# Patient Record
Sex: Female | Born: 1955
Health system: Southern US, Community
[De-identification: ages and names within clinical notes are randomized; demographics above are authoritative.]

## PROBLEM LIST (undated history)

## (undated) DIAGNOSIS — E785 Hyperlipidemia, unspecified: Secondary | ICD-10-CM

## (undated) DIAGNOSIS — E119 Type 2 diabetes mellitus without complications: Secondary | ICD-10-CM

## (undated) DIAGNOSIS — A6920 Lyme disease, unspecified: Secondary | ICD-10-CM

## (undated) DIAGNOSIS — R635 Abnormal weight gain: Secondary | ICD-10-CM

## (undated) DIAGNOSIS — I10 Essential (primary) hypertension: Secondary | ICD-10-CM

## (undated) HISTORY — PX: HYSTEROSCOPY W/ ENDOMETRIAL ABLATION: SUR665

## (undated) HISTORY — DX: Abnormal weight gain: R63.5

## (undated) HISTORY — PX: MYOMECTOMY VAGINAL APPROACH: SUR871

## (undated) HISTORY — DX: Type 2 diabetes mellitus without complications: E11.9

## (undated) HISTORY — DX: Lyme disease, unspecified: A69.20

## (undated) HISTORY — DX: Hyperlipidemia, unspecified: E78.5

## (undated) HISTORY — DX: Essential (primary) hypertension: I10

## (undated) HISTORY — PX: ABDOMINAL HYSTERECTOMY: SHX81

---

## 2000-08-13 ENCOUNTER — Other Ambulatory Visit: Admission: RE | Admit: 2000-08-13 | Discharge: 2000-08-13 | Payer: Self-pay | Admitting: *Deleted

## 2000-08-28 ENCOUNTER — Encounter: Payer: Self-pay | Admitting: *Deleted

## 2000-08-28 ENCOUNTER — Ambulatory Visit (HOSPITAL_COMMUNITY): Admission: RE | Admit: 2000-08-28 | Discharge: 2000-08-28 | Payer: Self-pay | Admitting: *Deleted

## 2004-03-09 ENCOUNTER — Ambulatory Visit: Payer: Self-pay | Admitting: Internal Medicine

## 2004-08-15 ENCOUNTER — Ambulatory Visit (HOSPITAL_BASED_OUTPATIENT_CLINIC_OR_DEPARTMENT_OTHER): Admission: RE | Admit: 2004-08-15 | Discharge: 2004-08-15 | Payer: Self-pay | Admitting: *Deleted

## 2004-08-15 ENCOUNTER — Ambulatory Visit (HOSPITAL_COMMUNITY): Admission: RE | Admit: 2004-08-15 | Discharge: 2004-08-15 | Payer: Self-pay | Admitting: *Deleted

## 2004-08-15 ENCOUNTER — Encounter (INDEPENDENT_AMBULATORY_CARE_PROVIDER_SITE_OTHER): Payer: Self-pay | Admitting: Specialist

## 2004-10-26 ENCOUNTER — Emergency Department (HOSPITAL_COMMUNITY): Admission: EM | Admit: 2004-10-26 | Discharge: 2004-10-26 | Payer: Self-pay | Admitting: Emergency Medicine

## 2004-11-16 ENCOUNTER — Ambulatory Visit: Payer: Self-pay | Admitting: Internal Medicine

## 2004-11-17 ENCOUNTER — Ambulatory Visit (HOSPITAL_COMMUNITY): Admission: RE | Admit: 2004-11-17 | Discharge: 2004-11-17 | Payer: Self-pay | Admitting: Internal Medicine

## 2005-01-19 ENCOUNTER — Ambulatory Visit: Payer: Self-pay | Admitting: Internal Medicine

## 2005-04-11 ENCOUNTER — Ambulatory Visit: Payer: Self-pay | Admitting: Internal Medicine

## 2005-10-10 ENCOUNTER — Ambulatory Visit: Payer: Self-pay | Admitting: Internal Medicine

## 2006-02-14 ENCOUNTER — Ambulatory Visit: Payer: Self-pay | Admitting: Internal Medicine

## 2006-02-14 LAB — CONVERTED CEMR LAB
Chol/HDL Ratio, serum: 3.9
Cholesterol: 198 mg/dL (ref 0–200)
HDL: 51.2 mg/dL (ref >39.0)
LDL Cholesterol: 138 mg/dL — ABNORMAL HIGH (ref 0–99)
Triglyceride fasting, serum: 46 mg/dL (ref 0–149)
VLDL: 9 mg/dL (ref 0–40)

## 2006-05-17 ENCOUNTER — Ambulatory Visit: Payer: Self-pay | Admitting: Internal Medicine

## 2006-07-11 ENCOUNTER — Ambulatory Visit: Payer: Self-pay | Admitting: Internal Medicine

## 2006-10-11 ENCOUNTER — Ambulatory Visit: Payer: Self-pay | Admitting: Internal Medicine

## 2006-10-18 ENCOUNTER — Ambulatory Visit (HOSPITAL_COMMUNITY): Admission: RE | Admit: 2006-10-18 | Discharge: 2006-10-18 | Payer: Self-pay | Admitting: Internal Medicine

## 2006-11-23 DIAGNOSIS — E119 Type 2 diabetes mellitus without complications: Secondary | ICD-10-CM

## 2006-11-23 DIAGNOSIS — I10 Essential (primary) hypertension: Secondary | ICD-10-CM | POA: Insufficient documentation

## 2006-11-23 DIAGNOSIS — E1169 Type 2 diabetes mellitus with other specified complication: Secondary | ICD-10-CM | POA: Insufficient documentation

## 2006-11-23 DIAGNOSIS — E669 Obesity, unspecified: Secondary | ICD-10-CM | POA: Insufficient documentation

## 2006-11-23 HISTORY — DX: Essential (primary) hypertension: I10

## 2006-11-23 HISTORY — DX: Type 2 diabetes mellitus without complications: E11.9

## 2007-01-03 ENCOUNTER — Ambulatory Visit: Payer: Self-pay | Admitting: Internal Medicine

## 2007-01-03 LAB — CONVERTED CEMR LAB
BUN: 13 mg/dL (ref 6–23)
CO2: 32 meq/L (ref 19–32)
Calcium: 9.7 mg/dL (ref 8.4–10.5)
Chloride: 107 meq/L (ref 96–112)
Creatinine, Ser: 0.6 mg/dL (ref 0.4–1.2)
Glucose, Bld: 92 mg/dL (ref 70–99)

## 2007-04-03 ENCOUNTER — Ambulatory Visit: Payer: Self-pay | Admitting: Internal Medicine

## 2007-06-18 ENCOUNTER — Ambulatory Visit: Payer: Self-pay | Admitting: Internal Medicine

## 2007-06-18 LAB — CONVERTED CEMR LAB: Hgb A1c MFr Bld: 6.1 % — ABNORMAL HIGH (ref 4.6–6.0)

## 2007-06-26 ENCOUNTER — Ambulatory Visit: Payer: Self-pay | Admitting: Internal Medicine

## 2007-09-17 ENCOUNTER — Encounter: Payer: Self-pay | Admitting: Internal Medicine

## 2007-10-14 ENCOUNTER — Ambulatory Visit: Payer: Self-pay | Admitting: Internal Medicine

## 2007-10-14 LAB — CONVERTED CEMR LAB
CO2: 31 meq/L (ref 19–32)
Calcium: 9.5 mg/dL (ref 8.4–10.5)
Creatinine,U: 57.1 mg/dL
Glucose, Bld: 96 mg/dL (ref 70–99)
Microalb Creat Ratio: 3.5 mg/g (ref 0.0–30.0)
Microalb, Ur: 0.2 mg/dL (ref 0.0–1.9)
Potassium: 4.3 meq/L (ref 3.5–5.1)
Sodium: 142 meq/L (ref 135–145)

## 2007-10-29 ENCOUNTER — Ambulatory Visit (HOSPITAL_COMMUNITY): Admission: RE | Admit: 2007-10-29 | Discharge: 2007-10-29 | Payer: Self-pay | Admitting: Internal Medicine

## 2008-04-08 ENCOUNTER — Ambulatory Visit: Payer: Self-pay | Admitting: Internal Medicine

## 2008-04-08 DIAGNOSIS — E785 Hyperlipidemia, unspecified: Secondary | ICD-10-CM | POA: Insufficient documentation

## 2008-04-08 HISTORY — DX: Hyperlipidemia, unspecified: E78.5

## 2008-04-08 LAB — CONVERTED CEMR LAB
BUN: 19 mg/dL (ref 6–23)
CO2: 31 meq/L (ref 19–32)
Chloride: 107 meq/L (ref 96–112)
Cholesterol: 196 mg/dL (ref 0–200)
Creatinine, Ser: 0.6 mg/dL (ref 0.4–1.2)
GFR calc non Af Amer: 111 mL/min
Hgb A1c MFr Bld: 6.4 % — ABNORMAL HIGH (ref 4.6–6.0)
LDL Cholesterol: 130 mg/dL — ABNORMAL HIGH (ref 0–99)
Potassium: 4.5 meq/L (ref 3.5–5.1)
Total CHOL/HDL Ratio: 3.8
Triglycerides: 71 mg/dL (ref 0–149)

## 2008-07-13 ENCOUNTER — Ambulatory Visit: Payer: Self-pay | Admitting: Internal Medicine

## 2008-10-12 ENCOUNTER — Ambulatory Visit: Payer: Self-pay | Admitting: Internal Medicine

## 2008-10-12 DIAGNOSIS — R635 Abnormal weight gain: Secondary | ICD-10-CM | POA: Insufficient documentation

## 2008-10-12 HISTORY — DX: Abnormal weight gain: R63.5

## 2008-10-12 LAB — CONVERTED CEMR LAB
BUN: 22 mg/dL (ref 6–23)
CO2: 32 meq/L (ref 19–32)
Calcium: 9.8 mg/dL (ref 8.4–10.5)
Chloride: 102 meq/L (ref 96–112)
Cholesterol: 197 mg/dL (ref 0–200)
Creatinine, Ser: 0.6 mg/dL (ref 0.4–1.2)
GFR calc non Af Amer: 110.65 mL/min (ref >60)
Glucose, Bld: 98 mg/dL (ref 70–99)
HDL: 49.1 mg/dL (ref >39.00)
Hgb A1c MFr Bld: 6.3 % (ref 4.6–6.5)
LDL Cholesterol: 132 mg/dL — ABNORMAL HIGH (ref 0–99)
Potassium: 4.6 meq/L (ref 3.5–5.1)
Sodium: 142 meq/L (ref 135–145)
Total CHOL/HDL Ratio: 4
Triglycerides: 78 mg/dL (ref 0.0–149.0)
VLDL: 15.6 mg/dL (ref 0.0–40.0)

## 2009-01-14 ENCOUNTER — Ambulatory Visit: Payer: Self-pay | Admitting: Internal Medicine

## 2009-03-17 ENCOUNTER — Ambulatory Visit: Payer: Self-pay | Admitting: Family Medicine

## 2009-03-17 DIAGNOSIS — S60429A Blister (nonthermal) of unspecified finger, initial encounter: Secondary | ICD-10-CM | POA: Insufficient documentation

## 2009-04-13 ENCOUNTER — Ambulatory Visit: Payer: Self-pay | Admitting: Internal Medicine

## 2009-04-13 LAB — CONVERTED CEMR LAB
BUN: 16 mg/dL (ref 6–23)
Calcium: 9.5 mg/dL (ref 8.4–10.5)
Creatinine, Ser: 0.6 mg/dL (ref 0.4–1.2)
GFR calc non Af Amer: 110.44 mL/min (ref >60)
Glucose, Bld: 106 mg/dL — ABNORMAL HIGH (ref 70–99)
Hgb A1c MFr Bld: 6.3 % (ref 4.6–6.5)

## 2009-07-13 ENCOUNTER — Ambulatory Visit: Payer: Self-pay | Admitting: Internal Medicine

## 2009-10-12 ENCOUNTER — Ambulatory Visit: Payer: Self-pay | Admitting: Internal Medicine

## 2009-10-12 LAB — CONVERTED CEMR LAB
BUN: 18 mg/dL (ref 6–23)
Calcium: 9.5 mg/dL (ref 8.4–10.5)
Creatinine, Ser: 0.7 mg/dL (ref 0.4–1.2)
GFR calc non Af Amer: 98.76 mL/min (ref >60)
Hgb A1c MFr Bld: 6.4 % (ref 4.6–6.5)

## 2010-01-05 ENCOUNTER — Ambulatory Visit (HOSPITAL_COMMUNITY): Admission: RE | Admit: 2010-01-05 | Discharge: 2010-01-05 | Payer: Self-pay | Admitting: Internal Medicine

## 2010-02-28 ENCOUNTER — Ambulatory Visit: Payer: Self-pay | Admitting: Internal Medicine

## 2010-02-28 LAB — CONVERTED CEMR LAB: Hgb A1c MFr Bld: 6.6 % — ABNORMAL HIGH (ref 4.6–6.5)

## 2010-05-21 ENCOUNTER — Encounter: Payer: Self-pay | Admitting: *Deleted

## 2010-05-26 NOTE — Assessment & Plan Note (Signed)
Summary: 108mo rov/mm   Vital Signs:  Patient profile:   55 year old female Height:      66 inches Weight:      218 pounds BMI:     35.31 Temp:     98.2 degrees F oral Pulse rate:   64 / minute Resp:     14 per minute BP sitting:   130 / 82  (left arm)  Vitals Entered By: Willy Eddy, LPN (July 13, 2009 10:20 AM) CC: roa- dm/htn, Hypertension Management   CC:  roa- dm/htn and Hypertension Management.  History of Present Illness: discussion of dm meds and changing the actos to onglyza monitering of glucose has been good with CBG's in the 100 range  Hypertension History:      She denies headache, chest pain, palpitations, dyspnea with exertion, orthopnea, PND, peripheral edema, visual symptoms, neurologic problems, syncope, and side effects from treatment.        Positive major cardiovascular risk factors include diabetes, hyperlipidemia, hypertension, and family history for ischemic heart disease (females less than 45 years old).  Negative major cardiovascular risk factors include female age less than 41 years old and non-tobacco-user status.        Further assessment for target organ damage reveals no history of ASHD, cardiac end-organ damage (CHF/LVH), stroke/TIA, peripheral vascular disease, renal insufficiency, or hypertensive retinopathy.     Preventive Screening-Counseling & Management  Alcohol-Tobacco     Smoking Status: never  Problems Prior to Update: 1)  Finger Blister Without Mention of Infection  (ICD-915.2) 2)  Weight Gain  (ICD-783.1) 3)  Hyperlipidemia, Borderline  (ICD-272.4) 4)  Physical Examination, Normal  (ICD-V70.0) 5)  Family History Diabetes 1st Degree Relative  (ICD-V18.0) 6)  Hypertension  (ICD-401.9) 7)  Diabetes Mellitus, Type II  (ICD-250.00)  Current Problems (verified): 1)  Finger Blister Without Mention of Infection  (ICD-915.2) 2)  Weight Gain  (ICD-783.1) 3)  Hyperlipidemia, Borderline  (ICD-272.4) 4)  Physical Examination, Normal   (ICD-V70.0) 5)  Family History Diabetes 1st Degree Relative  (ICD-V18.0) 6)  Hypertension  (ICD-401.9) 7)  Diabetes Mellitus, Type II  (ICD-250.00)  Medications Prior to Update: 1)  Ziac 2.5-6.25 Mg  Tabs (Bisoprolol-Hydrochlorothiazide) .... Take 1 Tablet By Mouth Once A Day 2)  Actos 15 Mg Tab (Pioglitazone Hcl) .... Take 1 Tablet By Mouth Once A Day  Current Medications (verified): 1)  Ziac 2.5-6.25 Mg  Tabs (Bisoprolol-Hydrochlorothiazide) .... Take 1 Tablet By Mouth Once A Day 2)  Onglyza 2.5 Mg Tabs (Saxagliptin Hcl)  Allergies (verified): No Known Drug Allergies  Past History:  Family History: Last updated: 01/03/2007 Family History Diabetes 1st degree relative Family History High cholesterol Family History Hypertension  Social History: Last updated: 01/03/2007 Married Never Smoked Drug use-no Regular exercise-yes  Risk Factors: Exercise: yes (01/03/2007)  Risk Factors: Smoking Status: never (07/13/2009)  Past medical, surgical, family and social histories (including risk factors) reviewed, and no changes noted (except as noted below).  Past Medical History: Reviewed history from 11/23/2006 and no changes required. Diabetes mellitus, type II Hypertension  Past Surgical History: Reviewed history from 01/03/2007 and no changes required. GYN surgery  Family History: Reviewed history from 01/03/2007 and no changes required. Family History Diabetes 1st degree relative Family History High cholesterol Family History Hypertension  Social History: Reviewed history from 01/03/2007 and no changes required. Married Never Smoked Drug use-no Regular exercise-yes  Review of Systems  The patient denies anorexia, fever, weight loss, weight gain, vision loss, decreased hearing, hoarseness,  chest pain, syncope, dyspnea on exertion, peripheral edema, prolonged cough, headaches, hemoptysis, abdominal pain, melena, hematochezia, severe indigestion/heartburn, hematuria,  incontinence, genital sores, muscle weakness, suspicious skin lesions, transient blindness, difficulty walking, depression, unusual weight change, abnormal bleeding, enlarged lymph nodes, angioedema, and breast masses.    Physical Exam  General:  Well-developed,well-nourished,in no acute distress; alert,appropriate and cooperative throughout examination Head:  Normocephalic and atraumatic without obvious abnormalities. No apparent alopecia or balding. Eyes:  pupils equal and pupils round.   Ears:  R ear normal and L ear normal.   Neck:  supple.   Lungs:  normal respiratory effort and no intercostal retractions.   Heart:  normal rate, regular rhythm, and no murmur.   Abdomen:  Bowel sounds positive,abdomen soft and non-tender without masses, organomegaly or hernias noted. Msk:  No deformity or scoliosis noted of thoracic or lumbar spine.   Pulses:  R and L carotid,radial,femoral,dorsalis pedis and posterior tibial pulses are full and equal bilaterally   Impression & Recommendations:  Problem # 1:  HYPERTENSION (ICD-401.9)  Her updated medication list for this problem includes:    Ziac 2.5-6.25 Mg Tabs (Bisoprolol-hydrochlorothiazide) .Marland Kitchen... Take 1 tablet by mouth once a day  BP today: 130/82 Prior BP: 110/80 (04/13/2009)  Prior 10 Yr Risk Heart Disease: 15 % (04/13/2009)  Labs Reviewed: K+: 4.1 (04/13/2009) Creat: : 0.6 (04/13/2009)   Chol: 197 (10/12/2008)   HDL: 49.10 (10/12/2008)   LDL: 132 (10/12/2008)   TG: 78.0 (10/12/2008)  Problem # 2:  DIABETES MELLITUS, TYPE II (ICD-250.00)  The following medications were removed from the medication list:    Actos 15 Mg Tab (Pioglitazone hcl) .Marland Kitchen... Take 1 tablet by mouth once a day Her updated medication list for this problem includes:    Onglyza 2.5 Mg Tabs (Saxagliptin hcl)  Labs Reviewed: Creat: 0.6 (04/13/2009)    Reviewed HgBA1c results: 6.3 (04/13/2009)  6.3 (10/12/2008)  Complete Medication List: 1)  Ziac 2.5-6.25 Mg Tabs  (Bisoprolol-hydrochlorothiazide) .... Take 1 tablet by mouth once a day 2)  Onglyza 2.5 Mg Tabs (Saxagliptin hcl)  Hypertension Assessment/Plan:      The patient's hypertensive risk group is category C: Target organ damage and/or diabetes.  Her calculated 10 year risk of coronary heart disease is 15 %.  Today's blood pressure is 130/82.  Her blood pressure goal is < 130/80.  Patient Instructions: 1)  Please schedule a follow-up appointment in 3 months.

## 2010-05-26 NOTE — Assessment & Plan Note (Signed)
Summary: 4 month rov/njr/PT RESCD FROM [facility]//CCM/pt rescd from [facility]//c...   Vital Signs:  Patient profile:   55 year old female Height:      66 inches Weight:      216 pounds BMI:     34.99 Temp:     98.6 degrees F oral Pulse rate:   72 / minute Resp:     14 per minute BP sitting:   136 / 80  (left arm)  Vitals Entered By: Willy Eddy, LPN (February 28, 2010 10:02 AM) CC: roa, Hypertension Management Is Patient Diabetic? Yes Did you bring your meter with you today? No   Primary Care Provider:  Stacie Glaze MD  CC:  roa and Hypertension Management.  History of Present Illness: the pt does not test her glucose  Hypertension History:      She denies headache, chest pain, palpitations, dyspnea with exertion, orthopnea, PND, peripheral edema, visual symptoms, neurologic problems, syncope, and side effects from treatment.        Positive major cardiovascular risk factors include female age 46 years old or older, diabetes, hyperlipidemia, hypertension, and family history for ischemic heart disease (females less than 32 years old).  Negative major cardiovascular risk factors include non-tobacco-user status.        Further assessment for target organ damage reveals no history of ASHD, cardiac end-organ damage (CHF/LVH), stroke/TIA, peripheral vascular disease, renal insufficiency, or hypertensive retinopathy.     Preventive Screening-Counseling & Management  Alcohol-Tobacco     Smoking Status: never     Tobacco Counseling: not indicated; no tobacco use  Problems Prior to Update: 1)  Finger Blister Without Mention of Infection  (ICD-915.2) 2)  Weight Gain  (ICD-783.1) 3)  Hyperlipidemia, Borderline  (ICD-272.4) 4)  Physical Examination, Normal  (ICD-V70.0) 5)  Family History Diabetes 1st Degree Relative  (ICD-V18.0) 6)  Hypertension  (ICD-401.9) 7)  Diabetes Mellitus, Type II  (ICD-250.00)  Current Problems (verified): 1)  Finger Blister Without Mention of Infection   (ICD-915.2) 2)  Weight Gain  (ICD-783.1) 3)  Hyperlipidemia, Borderline  (ICD-272.4) 4)  Physical Examination, Normal  (ICD-V70.0) 5)  Family History Diabetes 1st Degree Relative  (ICD-V18.0) 6)  Hypertension  (ICD-401.9) 7)  Diabetes Mellitus, Type II  (ICD-250.00)  Medications Prior to Update: 1)  Ziac 2.5-6.25 Mg  Tabs (Bisoprolol-Hydrochlorothiazide) .... Take 1 Tablet By Mouth Once A Day 2)  Onglyza 2.5 Mg Tabs (Saxagliptin Hcl) .Marland Kitchen.. 1 Once Daily  Current Medications (verified): 1)  Ziac 2.5-6.25 Mg  Tabs (Bisoprolol-Hydrochlorothiazide) .... Take 1 Tablet By Mouth Once A Day 2)  Onglyza 2.5 Mg Tabs (Saxagliptin Hcl) .Marland Kitchen.. 1 Once Daily  Allergies (verified): No Known Drug Allergies  Past History:  Family History: Last updated: 01/03/2007 Family History Diabetes 1st degree relative Family History High cholesterol Family History Hypertension  Social History: Last updated: 01/03/2007 Married Never Smoked Drug use-no Regular exercise-yes  Risk Factors: Exercise: yes (01/03/2007)  Risk Factors: Smoking Status: never (02/28/2010)  Past medical, surgical, family and social histories (including risk factors) reviewed, and no changes noted (except as noted below).  Past Medical History: Reviewed history from 11/23/2006 and no changes required. Diabetes mellitus, type II Hypertension  Past Surgical History: Reviewed history from 01/03/2007 and no changes required. GYN surgery  Family History: Reviewed history from 01/03/2007 and no changes required. Family History Diabetes 1st degree relative Family History High cholesterol Family History Hypertension  Social History: Reviewed history from 01/03/2007 and no changes required. Married Never Smoked Drug  use-no Regular exercise-yes  Review of Systems  The patient denies anorexia, fever, weight loss, weight gain, vision loss, decreased hearing, hoarseness, chest pain, syncope, dyspnea on exertion, peripheral  edema, prolonged cough, headaches, hemoptysis, abdominal pain, melena, hematochezia, severe indigestion/heartburn, hematuria, incontinence, genital sores, muscle weakness, suspicious skin lesions, transient blindness, difficulty walking, depression, unusual weight change, abnormal bleeding, enlarged lymph nodes, angioedema, and breast masses.         Flu Vaccine Consent Questions     Do you have a history of severe allergic reactions to this vaccine? no    Any prior history of allergic reactions to egg and/or gelatin? no    Do you have a sensitivity to the preservative Thimersol? no    Do you have a past history of Guillan-Barre Syndrome? no    Do you currently have an acute febrile illness? no    Have you ever had a severe reaction to latex? no    Vaccine information given and explained to patient? yes    Are you currently pregnant? no    Lot Number:[id number]   Exp Date:10/22/2010   Site Given  Left Deltoid IM   Physical Exam  General:  Well-developed,well-nourished,in no acute distress; alert,appropriate and cooperative throughout examination Eyes:  pupils equal and pupils round.   Ears:  R ear normal and L ear normal.   Mouth:  no dental plaque and pharynx pink and moist.   Lungs:  normal respiratory effort and no intercostal retractions.   Heart:  normal rate, regular rhythm, and no murmur.   Abdomen:  Bowel sounds positive,abdomen soft and non-tender without masses, organomegaly or hernias noted. Msk:  No deformity or scoliosis noted of thoracic or lumbar spine.    Diabetes Management Exam:    Foot Exam (with socks and/or shoes not present):       Sensory-Pinprick/Light touch:          Left medial foot (L-4): normal          Left dorsal foot (L-5): normal          Left lateral foot (S-1): normal          Right medial foot (L-4): normal          Right dorsal foot (L-5): normal          Right lateral foot (S-1): normal       Sensory-Monofilament:          Left foot: normal           Right foot: normal       Inspection:          Left foot: normal          Right foot: normal       Nails:          Left foot: normal          Right foot: normal   Impression & Recommendations:  Problem # 1:  HYPERTENSION (ICD-401.9)  Her updated medication list for this problem includes:    Ziac 2.5-6.25 Mg Tabs (Bisoprolol-hydrochlorothiazide) .Marland Kitchen... Take 1 tablet by mouth once a day  BP today: 136/80 Prior BP: 120/78 (10/12/2009)  Prior 10 Yr Risk Heart Disease: 17 % (10/12/2009)  Labs Reviewed: K+: 4.4 (10/12/2009) Creat: : 0.7 (10/12/2009)   Chol: 212 (10/12/2009)   HDL: 49.30 (10/12/2009)   LDL: 132 (10/12/2008)   TG: 78.0 (10/12/2008)  Problem # 2:  DIABETES MELLITUS, TYPE II (ICD-250.00)  Her updated medication list for this problem  includes:    Onglyza 2.5 Mg Tabs (Saxagliptin hcl) .Marland Kitchen... 1 once daily  Labs Reviewed: Creat: 0.7 (10/12/2009)    Reviewed HgBA1c results: 6.4 (10/12/2009)  6.3 (04/13/2009)  Orders: Venipuncture (56213) TLB-A1C / Hgb A1C (Glycohemoglobin) (83036-A1C)  Complete Medication List: 1)  Ziac 2.5-6.25 Mg Tabs (Bisoprolol-hydrochlorothiazide) .... Take 1 tablet by mouth once a day 2)  Onglyza 2.5 Mg Tabs (Saxagliptin hcl) .Marland Kitchen.. 1 once daily  Other Orders: Admin 1st Vaccine (08657) Flu Vaccine 26yrs + (84696)  Hypertension Assessment/Plan:      The patient's hypertensive risk group is category C: Target organ damage and/or diabetes.  Her calculated 10 year risk of coronary heart disease is 17 %.  Today's blood pressure is 136/80.  Her blood pressure goal is < 130/80.  Patient Instructions: 1)  weight loss needed 2)  It is important that you exercise regularly at least 20 minutes 5 times a week. If you develop chest pain, have severe difficulty breathing, or feel very tired , stop exercising immediately and seek medical attention.   Orders Added: 1)  Admin 1st Vaccine [90471] 2)  Flu Vaccine 69yrs + [90658] 3)  Est. Patient Level IV  [29528] 4)  Venipuncture [36415] 5)  TLB-A1C / Hgb A1C (Glycohemoglobin) [83036-A1C]  Appended Document: Orders Update    Clinical Lists Changes  Orders: Added new Service order of Specimen Handling (41324) - Signed

## 2010-05-26 NOTE — Assessment & Plan Note (Signed)
Summary: 34 month rov/njr   Vital Signs:  Patient profile:   55 year old female Height:      66 inches Weight:      216 pounds BMI:     34.99 Temp:     98.2 degrees F oral Pulse rate:   68 / minute Resp:     14 per minute BP sitting:   120 / 78  (left arm)  Vitals Entered By: Willy Eddy, LPN (October 12, 2009 9:22 AM) CC: roa, Hypertension Management   CC:  roa and Hypertension Management.  History of Present Illness: the pt needs to check sugars  but has been compliant with the medication weight down 3 pounds  Diabetes Management History:      The patient is a 55 years old female who comes in for evaluation of DM Type 2.  She has not been enrolled in the "Diabetic Education Program".  She states understanding of dietary principles and is following her diet appropriately.  Sensory loss is noted.  Self foot exams are being performed.  She is not checking home blood sugars.  She says that she is exercising.  Type of exercise includes: walking.  Duration of exercise is estimated to be 30 min.  She is doing this 3 times per week.        Hypoglycemic symptoms are not occurring.  No hyperglycemic symptoms are reported.        There are no symptoms to suggest diabetic complications.  No changes have been made to her treatment plan since last visit.    Hypertension History:      She denies headache, chest pain, palpitations, dyspnea with exertion, orthopnea, PND, peripheral edema, visual symptoms, neurologic problems, syncope, and side effects from treatment.        Positive major cardiovascular risk factors include female age 70 years old or older, diabetes, hyperlipidemia, hypertension, and family history for ischemic heart disease (females less than 59 years old).  Negative major cardiovascular risk factors include non-tobacco-user status.        Further assessment for target organ damage reveals no history of ASHD, cardiac end-organ damage (CHF/LVH), stroke/TIA, peripheral vascular  disease, renal insufficiency, or hypertensive retinopathy.     Preventive Screening-Counseling & Management  Alcohol-Tobacco     Smoking Status: never  Problems Prior to Update: 1)  Finger Blister Without Mention of Infection  (ICD-915.2) 2)  Weight Gain  (ICD-783.1) 3)  Hyperlipidemia, Borderline  (ICD-272.4) 4)  Physical Examination, Normal  (ICD-V70.0) 5)  Family History Diabetes 1st Degree Relative  (ICD-V18.0) 6)  Hypertension  (ICD-401.9) 7)  Diabetes Mellitus, Type II  (ICD-250.00)  Current Problems (verified): 1)  Finger Blister Without Mention of Infection  (ICD-915.2) 2)  Weight Gain  (ICD-783.1) 3)  Hyperlipidemia, Borderline  (ICD-272.4) 4)  Physical Examination, Normal  (ICD-V70.0) 5)  Family History Diabetes 1st Degree Relative  (ICD-V18.0) 6)  Hypertension  (ICD-401.9) 7)  Diabetes Mellitus, Type II  (ICD-250.00)  Medications Prior to Update: 1)  Ziac 2.5-6.25 Mg  Tabs (Bisoprolol-Hydrochlorothiazide) .... Take 1 Tablet By Mouth Once A Day 2)  Onglyza 2.5 Mg Tabs (Saxagliptin Hcl)  Current Medications (verified): 1)  Ziac 2.5-6.25 Mg  Tabs (Bisoprolol-Hydrochlorothiazide) .... Take 1 Tablet By Mouth Once A Day 2)  Onglyza 2.5 Mg Tabs (Saxagliptin Hcl) .Marland Kitchen.. 1 Once Daily  Allergies (verified): No Known Drug Allergies  Past History:  Family History: Last updated: 01/03/2007 Family History Diabetes 1st degree relative Family History High cholesterol Family History  Hypertension  Social History: Last updated: 01/03/2007 Married Never Smoked Drug use-no Regular exercise-yes  Risk Factors: Exercise: yes (01/03/2007)  Risk Factors: Smoking Status: never (10/12/2009)  Past medical, surgical, family and social histories (including risk factors) reviewed, and no changes noted (except as noted below).  Past Medical History: Reviewed history from 11/23/2006 and no changes required. Diabetes mellitus, type II Hypertension  Past Surgical  History: Reviewed history from 01/03/2007 and no changes required. GYN surgery  Family History: Reviewed history from 01/03/2007 and no changes required. Family History Diabetes 1st degree relative Family History High cholesterol Family History Hypertension  Social History: Reviewed history from 01/03/2007 and no changes required. Married Never Smoked Drug use-no Regular exercise-yes  Review of Systems  The patient denies anorexia, fever, weight loss, weight gain, vision loss, decreased hearing, hoarseness, chest pain, syncope, dyspnea on exertion, peripheral edema, prolonged cough, headaches, hemoptysis, abdominal pain, melena, hematochezia, severe indigestion/heartburn, hematuria, incontinence, genital sores, muscle weakness, suspicious skin lesions, transient blindness, difficulty walking, depression, unusual weight change, abnormal bleeding, enlarged lymph nodes, angioedema, and breast masses.    Physical Exam  General:  Well-developed,well-nourished,in no acute distress; alert,appropriate and cooperative throughout examination Head:  Normocephalic and atraumatic without obvious abnormalities. No apparent alopecia or balding. Eyes:  pupils equal and pupils round.   Ears:  R ear normal and L ear normal.   Mouth:  no dental plaque and pharynx pink and moist.   Neck:  supple.   Lungs:  normal respiratory effort and no intercostal retractions.   Heart:  normal rate, regular rhythm, and no murmur.   Abdomen:  Bowel sounds positive,abdomen soft and non-tender without masses, organomegaly or hernias noted. Msk:  No deformity or scoliosis noted of thoracic or lumbar spine.   Extremities:  hand is improved from injury Neurologic:  No cranial nerve deficits noted. Station and gait are normal. Plantar reflexes are down-going bilaterally. DTRs are symmetrical throughout. Sensory, motor and coordinative functions appear intact.   Impression & Recommendations:  Problem # 1:  HYPERTENSION  (ICD-401.9)  Her updated medication list for this problem includes:    Ziac 2.5-6.25 Mg Tabs (Bisoprolol-hydrochlorothiazide) .Marland Kitchen... Take 1 tablet by mouth once a day  BP today: 120/78 Prior BP: 130/82 (07/13/2009)  10 Yr Risk Heart Disease: 17 % Prior 10 Yr Risk Heart Disease: 15 % (04/13/2009)  Labs Reviewed: K+: 4.1 (04/13/2009) Creat: : 0.6 (04/13/2009)   Chol: 197 (10/12/2008)   HDL: 49.10 (10/12/2008)   LDL: 132 (10/12/2008)   TG: 78.0 (10/12/2008)  Problem # 2:  DIABETES MELLITUS, TYPE II (ICD-250.00)  Her updated medication list for this problem includes:    Onglyza 2.5 Mg Tabs (Saxagliptin hcl) .Marland Kitchen... 1 once daily  Labs Reviewed: Creat: 0.6 (04/13/2009)    Reviewed HgBA1c results: 6.3 (04/13/2009)  6.3 (10/12/2008)  Orders: Venipuncture (64403) TLB-BMP (Basic Metabolic Panel-BMET) (80048-METABOL) TLB-A1C / Hgb A1C (Glycohemoglobin) (83036-A1C)  Complete Medication List: 1)  Ziac 2.5-6.25 Mg Tabs (Bisoprolol-hydrochlorothiazide) .... Take 1 tablet by mouth once a day 2)  Onglyza 2.5 Mg Tabs (Saxagliptin hcl) .Marland Kitchen.. 1 once daily  Other Orders: TLB-Cholesterol, Total (82465-CHO) TLB-Cholesterol, Direct LDL (83721-DIRLDL) TLB-Cholesterol, HDL (83718-HDL)  Diabetes Management Assessment/Plan:      Her blood pressure goal is < 130/80.    Hypertension Assessment/Plan:      The patient's hypertensive risk group is category C: Target organ damage and/or diabetes.  Her calculated 10 year risk of coronary heart disease is 17 %.  Today's blood pressure is 120/78.  Her blood  pressure goal is < 130/80.  Patient Instructions: 1)  Please schedule a follow-up appointment in 4 months.  ( MAY MAKE ONE FOR MOTHER AT SOME TIME Prescriptions: ONGLYZA 2.5 MG TABS (SAXAGLIPTIN HCL) 1 once daily  #30 x 6   Entered by:   Willy Eddy, LPN   Authorized by:   Stacie Glaze MD   Signed by:   Willy Eddy, LPN on 81/19/1478   Method used:   Electronically to        [facility]  [phone]* (retail)       [address]       [place], [place]  [address]       Ph: [phone] or [phone]       Fax: [phone]   RxID:   [id number]

## 2010-06-02 ENCOUNTER — Ambulatory Visit (INDEPENDENT_AMBULATORY_CARE_PROVIDER_SITE_OTHER): Payer: Managed Care, Other (non HMO) | Admitting: Internal Medicine

## 2010-06-02 ENCOUNTER — Encounter: Payer: Self-pay | Admitting: Internal Medicine

## 2010-06-02 DIAGNOSIS — E785 Hyperlipidemia, unspecified: Secondary | ICD-10-CM

## 2010-06-02 DIAGNOSIS — E119 Type 2 diabetes mellitus without complications: Secondary | ICD-10-CM

## 2010-06-02 DIAGNOSIS — I10 Essential (primary) hypertension: Secondary | ICD-10-CM

## 2010-06-02 MED ORDER — BISOPROLOL-HYDROCHLOROTHIAZIDE 2.5-6.25 MG PO TABS: 1.0000 | ORAL_TABLET | Freq: Every day | ORAL | Status: DC

## 2010-06-02 MED ORDER — OMEGA-3 KRILL OIL 300 MG PO CAPS: 1.0000 | ORAL_CAPSULE | Freq: Two times a day (BID) | ORAL | Status: DC

## 2010-06-02 NOTE — Assessment & Plan Note (Signed)
The patient admits that fast food consumption is a large part of the increase in her total cholesterol and LDL cholesterol increasing her risk factors for cardiovascular disease we discussed elimination of such fast foods as french fries combined with a weight loss plan and we would recommend to add omega-3's such as visual capsules or kril oil to herr diet and supplement plan

## 2010-06-02 NOTE — Progress Notes (Signed)
  Subjective:    Patient ID: Amanda Eaton, female    DOB: 02-15-1956, 55 y.o.   MRN: [id number]  HPI  patient presents for followup of her diabetes and hypertension as well as management of obesity she has admitted to dietary noncompliance and has been eating more fast food lately weight gain is noted and documented in chart.   Her blood pressure is moderately well controlled but above our goal of 130/80 she is compliant with her medications and reports no side effects.  She is not monitoring her diabetes and has no fingerstick records to review but her dietary noncompliance suggested her A1c would be elevated her last A1c was 6.6   Review of Systems  Constitutional: Negative for activity change, appetite change and fatigue.  HENT: Negative for ear pain, congestion, neck pain, postnasal drip and sinus pressure.   Eyes: Negative for redness and visual disturbance.  Respiratory: Negative for cough, shortness of breath and wheezing.   Gastrointestinal: Negative for abdominal pain and abdominal distention.  Genitourinary: Negative for dysuria, frequency and menstrual problem.  Musculoskeletal: Negative for myalgias, joint swelling and arthralgias.  Skin: Negative for rash and wound.  Neurological: Negative for dizziness, weakness and headaches.  Hematological: Negative for adenopathy. Does not bruise/bleed easily.  Psychiatric/Behavioral: Negative for sleep disturbance and decreased concentration.       Objective:   Physical Exam  Constitutional: She is oriented to person, place, and time. She appears well-developed and well-nourished. No distress.  HENT:  Head: Normocephalic and atraumatic.  Right Ear: External ear normal.  Left Ear: External ear normal.  Nose: Nose normal.  Mouth/Throat: Oropharynx is clear and moist.  Eyes: Conjunctivae and EOM are normal. Pupils are equal, round, and reactive to light.  Neck: Normal range of motion. Neck supple. No JVD present. No tracheal  deviation present. No thyromegaly present.  Cardiovascular: Normal rate, regular rhythm, normal heart sounds and intact distal pulses.   No murmur heard. Pulmonary/Chest: Effort normal and breath sounds normal. She has no wheezes. She exhibits no tenderness.  Abdominal: Soft. Bowel sounds are normal.  Musculoskeletal: Normal range of motion. She exhibits no edema and no tenderness.  Lymphadenopathy:    She has no cervical adenopathy.  Neurological: She is alert and oriented to person, place, and time. She has normal reflexes. No cranial nerve deficit.  Skin: Skin is warm and dry. She is not diaphoretic.  Psychiatric: She has a normal mood and affect. Her behavior is normal.   Diabetic foot exam:  Left: Reflexes 2+   Vibratory sensation normal  Proprioception normal  Sharp/dull discrimination normal  Filament test present Right: Reflexes 4+   Vibratory sensation normal  Proprioception normal  Sharp/dull discrimination normal  Filament test present       Assessment & Plan:

## 2010-06-02 NOTE — Assessment & Plan Note (Signed)
Again weight loss is key part of our there appear to intervention for her blood pressure days blood pressure was was over the goal for our patient of 130/80

## 2010-06-02 NOTE — Assessment & Plan Note (Signed)
The patient is a 55 year old white female who presents for followup of her diabetes. She has not been monitoring her fingerstick blood glucoses and her last A1c was 6.6 this is an increased value for her before this her baseline A1c's were hovering in the 6.3 range the elevation of her A1c's is due in part to dietary indiscretion as well as loss of weight control. Our plan for her is to resume monitoring her blood glucoses adopt a program of exercise on a regular basis and says her weight loss goals and 10 pounds between now and her return office visit in 3 months.

## 2010-09-09 NOTE — Op Note (Signed)
NAME:  Amanda Eaton, Amanda Eaton               ACCOUNT NO.:  [id number]   MEDICAL RECORD NO.:  [id number]          PATIENT TYPE:  AMB   LOCATION:  [facility]                         FACILITY:  [facility]   PHYSICIAN:  Pershing Cox, M.D.DATE OF BIRTH:  1955/05/05   DATE OF PROCEDURE:  08/15/2004  DATE OF DISCHARGE:                                 OPERATIVE REPORT   PREOPERATIVE DIAGNOSIS:  Menorrhagia and endometrial polyp.   POSTOPERATIVE DIAGNOSIS:  Menorrhagia and endometrial polyp, uterine myomas.   OPERATION PERFORMED:  Examination under anesthesia, fractional D&C,  hysteroscopy with resection of endometrial polyp.   SURGEON:  Pershing Cox, M.D.   ASSISTANT:  None.   ANESTHESIA:   INDICATIONS FOR PROCEDURE:  The patient has been followed in our office for  gyn care for some time.  In August of last year, her uterus was found to  have increased significantly in size and a sonogram was performed.  On the  sonogram she had numbers of myomas and a thickened endometrial cavity.  She  returned in August for a hydrosonogram and this hydrosonogram showed a mass  in the endometrial canal which looked like an endometrial polyp.  She does  have menorrhagia with four days of clots associated with each of her  menstrual periods and occasionally changes as often as every three hours.  In preparation for this procedure, she received a dose of Aygestin to bring  on a scheduled menstrual period and she was bleeding at the time of this  procedure.   OPERATIVE FINDINGS:  Examination under anesthesia showed the uterus to be 14  weeks in size and irregular in shape.  The endometrial canal sounds to a  depth of 13 cm.  The cavity was distended and except for the broad based  polyp noted on the posterior uterine wall in the lower uterine segment,  there were no abnormalities found.  The specimens of resections and  curettings were combined at the end of the procedure.   DESCRIPTION OF PROCEDURE:  Amanda Eaton was brought to the operating room  with an IV in place.  She received a gram of Ancef in the holding area and  supine on the operating room table, IV sedation was administered.  She was  then placed into Allen stirrups and using Betadine, her perineum, vagina and  upper thighs and lower abdomen were prepped for the surgical procedure, then  draped sterilely. A collecting drape was placed beneath her hips so that the  effluent could be measured at all times throughout the procedure.   A weighted speculum was introduced into the vagina.  The cervix was at the  introitus and was grasped with a single toothed tenaculum.  0.25% Marcaine  was instilled into the paracervical areas at 3, 4, 7 and 8 sites with an  instillation of total 10 cc.  Endocervical curettings were collected onto a  Telfa.  Uterine sound then passed to a depth of 13 cm.  0 Pratt dilators  were used to dilate the cervix to a size 13.  The resectoscope was  introduced and using through-and-through  irrigation, the cavity was  visualized and photographed. A single loop wire was used to resected the  noted endometrial polyp and the fragments were collected for specimen.  A  #12 sharp curette was used to curette the endometrial lining onto a Telfa  and the Meigs curette was then used to serially curette the uterine walls as  well.  All  of these specimens were sent as one to pathology as endometrial curettings  and resection.  The sound passed again to 13 cm.  The procedure was  terminated.  The patient was then transferred to the recovery room in good  condition.  The effluent measured during the procedure was only 25 cc.      MAJ/MEDQ  D:  08/15/2004  T:  08/15/2004  Job:  [id number]

## 2010-10-04 ENCOUNTER — Other Ambulatory Visit (INDEPENDENT_AMBULATORY_CARE_PROVIDER_SITE_OTHER): Payer: Managed Care, Other (non HMO)

## 2010-10-04 ENCOUNTER — Other Ambulatory Visit: Payer: Self-pay | Admitting: Internal Medicine

## 2010-10-04 DIAGNOSIS — I10 Essential (primary) hypertension: Secondary | ICD-10-CM

## 2010-10-04 DIAGNOSIS — E119 Type 2 diabetes mellitus without complications: Secondary | ICD-10-CM

## 2010-10-04 DIAGNOSIS — E785 Hyperlipidemia, unspecified: Secondary | ICD-10-CM

## 2010-10-04 LAB — BASIC METABOLIC PANEL
Chloride: 106 mEq/L (ref 96–112)
GFR: 89 mL/min (ref >60.00)
Glucose, Bld: 114 mg/dL — ABNORMAL HIGH (ref 70–99)
Potassium: 5.5 mEq/L — ABNORMAL HIGH (ref 3.5–5.1)
Sodium: 140 mEq/L (ref 135–145)

## 2010-10-04 LAB — LIPID PANEL
HDL: 55.5 mg/dL (ref >39.00)
Triglycerides: 55 mg/dL (ref 0.0–149.0)
VLDL: 11 mg/dL (ref 0.0–40.0)

## 2010-10-04 LAB — LDL CHOLESTEROL, DIRECT: Direct LDL: 158.8 mg/dL

## 2010-10-10 ENCOUNTER — Encounter: Payer: Self-pay | Admitting: Internal Medicine

## 2010-10-10 ENCOUNTER — Ambulatory Visit (INDEPENDENT_AMBULATORY_CARE_PROVIDER_SITE_OTHER): Payer: Managed Care, Other (non HMO) | Admitting: Internal Medicine

## 2010-10-10 VITALS — BP 130/80 | HR 72 | Temp 98.1°F | Resp 16 | Ht 66.0 in | Wt 208.0 lb

## 2010-10-10 DIAGNOSIS — R635 Abnormal weight gain: Secondary | ICD-10-CM

## 2010-10-10 DIAGNOSIS — E669 Obesity, unspecified: Secondary | ICD-10-CM

## 2010-10-10 DIAGNOSIS — E785 Hyperlipidemia, unspecified: Secondary | ICD-10-CM

## 2010-10-10 DIAGNOSIS — E1169 Type 2 diabetes mellitus with other specified complication: Secondary | ICD-10-CM

## 2010-10-10 DIAGNOSIS — E119 Type 2 diabetes mellitus without complications: Secondary | ICD-10-CM

## 2010-10-10 DIAGNOSIS — I10 Essential (primary) hypertension: Secondary | ICD-10-CM

## 2010-10-10 MED ORDER — SAXAGLIPTIN HCL 2.5 MG PO TABS: 6.2500 mg | ORAL_TABLET | Freq: Every day | ORAL | Status: DC

## 2010-10-10 MED ORDER — SAXAGLIPTIN-METFORMIN ER 2.5-1000 MG PO TB24: 1.0000 | ORAL_TABLET | Freq: Every day | ORAL | Status: DC

## 2010-10-10 NOTE — Progress Notes (Signed)
  Subjective:    Patient ID: Amanda Eaton, female    DOB: 1955/10/18, 55 y.o.   MRN: [id number]  HPI the patient has adult-onset diabetes and hypertension and hyperlipidemia.  Screening shows that her A1c despite weight loss is still elevated at 6.6 and her cholesterol has worsened over the past year.  She has an elevated total cholesterol triglycerides blood sugar and LDL are patent cholesterol.      Review of Systems  Constitutional: Negative for activity change, appetite change and fatigue.  HENT: Negative for ear pain, congestion, neck pain, postnasal drip and sinus pressure.   Eyes: Negative for redness and visual disturbance.  Respiratory: Negative for cough, shortness of breath and wheezing.   Gastrointestinal: Negative for abdominal pain and abdominal distention.  Genitourinary: Negative for dysuria, frequency and menstrual problem.  Musculoskeletal: Negative for myalgias, joint swelling and arthralgias.  Skin: Negative for rash and wound.  Neurological: Negative for dizziness, weakness and headaches.  Hematological: Negative for adenopathy. Does not bruise/bleed easily.  Psychiatric/Behavioral: Negative for sleep disturbance and decreased concentration.   Past Medical History  Diagnosis Date  . DIABETES MELLITUS, TYPE II 11/23/2006  . HYPERLIPIDEMIA, BORDERLINE 04/08/2008  . HYPERTENSION 11/23/2006  . WEIGHT GAIN 10/12/2008   Past Surgical History  Procedure Date  . Hysteroscopy w/ endometrial ablation     1987    reports that she has never smoked. She does not have any smokeless tobacco history on file. She reports that she drinks alcohol. She reports that she does not use illicit drugs. family history includes COPD in her father; Diabetes in her mother; Hyperlipidemia in her mother; and Hypertension in her mother. No Known Allergies     Objective:   Physical Exam  Nursing note and vitals reviewed. Constitutional: She is oriented to person, place, and time. She  appears well-developed and well-nourished. No distress.  HENT:  Head: Normocephalic and atraumatic.  Right Ear: External ear normal.  Left Ear: External ear normal.  Nose: Nose normal.  Mouth/Throat: Oropharynx is clear and moist.  Eyes: Conjunctivae and EOM are normal. Pupils are equal, round, and reactive to light.  Neck: Normal range of motion. Neck supple. No JVD present. No tracheal deviation present. No thyromegaly present.  Cardiovascular: Normal rate, regular rhythm, normal heart sounds and intact distal pulses.   No murmur heard. Pulmonary/Chest: Effort normal and breath sounds normal. She has no wheezes. She exhibits no tenderness.  Abdominal: Soft. Bowel sounds are normal.  Musculoskeletal: Normal range of motion. She exhibits no edema and no tenderness.  Lymphadenopathy:    She has no cervical adenopathy.  Neurological: She is alert and oriented to person, place, and time. She has normal reflexes. No cranial nerve deficit.  Skin: Skin is warm and dry. She is not diaphoretic.  Psychiatric: She has a normal mood and affect. Her behavior is normal.          Assessment & Plan:  The patient needs to resume omega-3 supplementation.  She also will be changing her diabetic control medications to a combination of Kombiglyze  She is to modify her diet to eliminate some of the processed foods and fried foods

## 2011-01-04 ENCOUNTER — Other Ambulatory Visit (INDEPENDENT_AMBULATORY_CARE_PROVIDER_SITE_OTHER): Payer: Managed Care, Other (non HMO)

## 2011-01-04 DIAGNOSIS — E119 Type 2 diabetes mellitus without complications: Secondary | ICD-10-CM

## 2011-01-10 ENCOUNTER — Ambulatory Visit (INDEPENDENT_AMBULATORY_CARE_PROVIDER_SITE_OTHER): Payer: Managed Care, Other (non HMO) | Admitting: Internal Medicine

## 2011-01-10 DIAGNOSIS — E1165 Type 2 diabetes mellitus with hyperglycemia: Secondary | ICD-10-CM

## 2011-01-10 DIAGNOSIS — E669 Obesity, unspecified: Secondary | ICD-10-CM

## 2011-01-10 DIAGNOSIS — E119 Type 2 diabetes mellitus without complications: Secondary | ICD-10-CM

## 2011-01-10 DIAGNOSIS — E1169 Type 2 diabetes mellitus with other specified complication: Secondary | ICD-10-CM

## 2011-01-10 DIAGNOSIS — Z23 Encounter for immunization: Secondary | ICD-10-CM

## 2011-01-10 DIAGNOSIS — I10 Essential (primary) hypertension: Secondary | ICD-10-CM

## 2011-01-17 ENCOUNTER — Other Ambulatory Visit: Payer: Self-pay | Admitting: Internal Medicine

## 2011-01-17 DIAGNOSIS — Z1231 Encounter for screening mammogram for malignant neoplasm of breast: Secondary | ICD-10-CM

## 2011-02-20 ENCOUNTER — Ambulatory Visit (HOSPITAL_COMMUNITY)
Admission: RE | Admit: 2011-02-20 | Discharge: 2011-02-20 | Disposition: A | Discharge status: Home / Self Care | Payer: Managed Care, Other (non HMO) | Source: Ambulatory Visit | Attending: Internal Medicine | Admitting: Internal Medicine

## 2011-02-20 DIAGNOSIS — Z1231 Encounter for screening mammogram for malignant neoplasm of breast: Secondary | ICD-10-CM | POA: Insufficient documentation

## 2011-02-24 ENCOUNTER — Other Ambulatory Visit: Payer: Self-pay | Admitting: Internal Medicine

## 2011-02-24 DIAGNOSIS — R928 Other abnormal and inconclusive findings on diagnostic imaging of breast: Secondary | ICD-10-CM

## 2011-02-25 NOTE — Progress Notes (Signed)
System Downtime Recovery The EMR experienced a system downtime.  This downtime occurred on 01-10-2011. During this downtime paper charting was completed by the provider.  The visit was documented on paper during the downtime and will be scanned into [facility], billing was completed by the [facility] Billing Department .  The visit is being closed on behalf of the provider. 

## 2011-03-06 ENCOUNTER — Ambulatory Visit (HOSPITAL_COMMUNITY): Payer: Managed Care, Other (non HMO)

## 2011-03-10 ENCOUNTER — Ambulatory Visit
Admission: RE | Admit: 2011-03-10 | Discharge: 2011-03-10 | Disposition: A | Discharge status: Home / Self Care | Payer: Managed Care, Other (non HMO) | Source: Ambulatory Visit | Attending: Internal Medicine | Admitting: Internal Medicine

## 2011-03-10 DIAGNOSIS — R928 Other abnormal and inconclusive findings on diagnostic imaging of breast: Secondary | ICD-10-CM

## 2011-04-07 ENCOUNTER — Other Ambulatory Visit: Payer: Self-pay | Admitting: Internal Medicine

## 2011-04-13 ENCOUNTER — Ambulatory Visit: Payer: Managed Care, Other (non HMO) | Admitting: Internal Medicine

## 2011-05-03 ENCOUNTER — Ambulatory Visit: Payer: Managed Care, Other (non HMO) | Admitting: Internal Medicine

## 2011-05-11 ENCOUNTER — Telehealth: Payer: Self-pay | Admitting: Family Medicine

## 2011-05-11 MED ORDER — SITAGLIP PHOS-METFORMIN HCL ER 100-1000 MG PO TB24: 1.0000 | ORAL_TABLET | Freq: Every day | ORAL | Status: DC

## 2011-05-11 NOTE — Telephone Encounter (Signed)
Left message on machine Changed to janumet xr 100/1000 qd .  Called to pharmacy

## 2011-05-11 NOTE — Telephone Encounter (Signed)
Prior Auth request for the Kombiglyze XR 2.5-1000mg  tabs was denied. Per [employer], the preferred meds are: Janumet, Janumet XR, and Jentadueto. Please advise.

## 2011-05-17 ENCOUNTER — Ambulatory Visit (INDEPENDENT_AMBULATORY_CARE_PROVIDER_SITE_OTHER): Payer: Managed Care, Other (non HMO) | Admitting: Internal Medicine

## 2011-05-17 ENCOUNTER — Encounter: Payer: Self-pay | Admitting: Internal Medicine

## 2011-05-17 VITALS — BP 130/80 | HR 72 | Temp 98.2°F | Resp 16 | Ht 66.0 in | Wt 196.0 lb

## 2011-05-17 DIAGNOSIS — E1165 Type 2 diabetes mellitus with hyperglycemia: Secondary | ICD-10-CM

## 2011-05-17 DIAGNOSIS — E1169 Type 2 diabetes mellitus with other specified complication: Secondary | ICD-10-CM

## 2011-05-17 NOTE — Patient Instructions (Signed)
The patient is instructed to continue all medications as prescribed. Schedule followup with check out clerk upon leaving the clinic  

## 2011-05-17 NOTE — Progress Notes (Signed)
Subjective:    Patient ID: Amanda Eaton, female    DOB: April 03, 1956, 56 y.o.   MRN: [id number]  HPI Patient presents for followup of diabetes and hypertension her blood pressure is controlled her diabetes is not well controlled as evidenced by weight training and dietary noncompliance.  She also has borderline hyperlipidemia and has a strong family history of coronary artery disease.     Review of Systems  Constitutional: Negative for activity change, appetite change and fatigue.  HENT: Negative for ear pain, congestion, neck pain, postnasal drip and sinus pressure.   Eyes: Negative for redness and visual disturbance.  Respiratory: Negative for cough, shortness of breath and wheezing.   Gastrointestinal: Negative for abdominal pain and abdominal distention.  Genitourinary: Negative for dysuria, frequency and menstrual problem.  Musculoskeletal: Negative for myalgias, joint swelling and arthralgias.  Skin: Negative for rash and wound.  Neurological: Negative for dizziness, weakness and headaches.  Hematological: Negative for adenopathy. Does not bruise/bleed easily.  Psychiatric/Behavioral: Negative for sleep disturbance and decreased concentration.   Past Medical History  Diagnosis Date  . DIABETES MELLITUS, TYPE II 11/23/2006  . HYPERLIPIDEMIA, BORDERLINE 04/08/2008  . HYPERTENSION 11/23/2006  . WEIGHT GAIN 10/12/2008    History   Social History  . Marital Status: Married    Spouse Name: N/A    Number of Children: N/A  . Years of Education: N/A   Occupational History  . Not on file.   Social History Main Topics  . Smoking status: Never Smoker   . Smokeless tobacco: Not on file  . Alcohol Use: Yes  . Drug Use: No  . Sexually Active: Yes   Other Topics Concern  . Not on file   Social History Narrative  . No narrative on file    Past Surgical History  Procedure Date  . Hysteroscopy w/ endometrial ablation     1987    Family History  Problem Relation Age of  Onset  . Diabetes Mother   . Hyperlipidemia Mother   . Hypertension Mother   . COPD Father     No Known Allergies  Current Outpatient Prescriptions on File Prior to Visit  Medication Sig Dispense Refill  . bisoprolol-hydrochlorothiazide (ZIAC) 2.5-6.25 MG per tablet TAKE ONE TABLET BY MOUTH EVERY DAY  30 tablet  5  . OMEGA-3 KRILL OIL 300 MG CAPS Take 1 capsule (300 mg total) by mouth 2 (two) times daily.      . SitaGLIPtin-MetFORMIN HCl [phone] MG TB24 Take 1 tablet by mouth daily.  30 tablet  11    BP 130/80  Pulse 72  Temp 98.2 F (36.8 C)  Resp 16  Ht 5\' 6"  (1.676 m)  Wt 196 lb (88.905 kg)  BMI 31.64 kg/m2        Objective:   Physical Exam  Nursing note and vitals reviewed. Constitutional: She is oriented to person, place, and time. She appears well-developed and well-nourished. No distress.  HENT:  Head: Normocephalic and atraumatic.  Right Ear: External ear normal.  Left Ear: External ear normal.  Nose: Nose normal.  Mouth/Throat: Oropharynx is clear and moist.  Eyes: Conjunctivae and EOM are normal. Pupils are equal, round, and reactive to light.  Neck: Normal range of motion. Neck supple. No JVD present. No tracheal deviation present. No thyromegaly present.  Cardiovascular: Normal rate, regular rhythm, normal heart sounds and intact distal pulses.   No murmur heard. Pulmonary/Chest: Effort normal and breath sounds normal. She has no wheezes. She exhibits no tenderness.  Abdominal: Soft. Bowel sounds are normal.  Musculoskeletal: Normal range of motion. She exhibits no edema and no tenderness.  Lymphadenopathy:    She has no cervical adenopathy.  Neurological: She is alert and oriented to person, place, and time. She has normal reflexes. No cranial nerve deficit.  Skin: Skin is warm and dry. She is not diaphoretic.  Psychiatric: She has a normal mood and affect. Her behavior is normal.          Assessment & Plan:  DM follow up This is a first visit  since the loss of her mother due to complications of chronic disease and the patient has admitted that she has not been as compliant with her diet. We discussed continuing her medications and beginning a concentrated effort at exercise self-care and following appropriate diet.   I have spent more than 30 minutes examining this patient face-to-face of which over half was spent in counseling

## 2011-05-18 LAB — BASIC METABOLIC PANEL
BUN: 19 mg/dL (ref 6–23)
Chloride: 104 mEq/L (ref 96–112)
GFR: 76.43 mL/min (ref >60.00)
Glucose, Bld: 89 mg/dL (ref 70–99)
Potassium: 3.7 mEq/L (ref 3.5–5.1)

## 2011-09-13 ENCOUNTER — Ambulatory Visit: Payer: Managed Care, Other (non HMO) | Admitting: Internal Medicine

## 2011-10-16 ENCOUNTER — Ambulatory Visit (INDEPENDENT_AMBULATORY_CARE_PROVIDER_SITE_OTHER): Payer: Managed Care, Other (non HMO) | Admitting: Internal Medicine

## 2011-10-16 ENCOUNTER — Encounter: Payer: Self-pay | Admitting: Internal Medicine

## 2011-10-16 VITALS — BP 132/84 | HR 72 | Temp 98.2°F | Resp 16 | Ht 66.0 in | Wt 190.0 lb

## 2011-10-16 DIAGNOSIS — I10 Essential (primary) hypertension: Secondary | ICD-10-CM

## 2011-10-16 DIAGNOSIS — IMO0001 Reserved for inherently not codable concepts without codable children: Secondary | ICD-10-CM

## 2011-10-16 LAB — BASIC METABOLIC PANEL
BUN: 23 mg/dL (ref 6–23)
CO2: 29 mEq/L (ref 19–32)
Chloride: 105 mEq/L (ref 96–112)
Creatinine, Ser: 0.6 mg/dL (ref 0.4–1.2)
GFR: 111.58 mL/min (ref >60.00)

## 2011-10-16 MED ORDER — BISOPROLOL-HYDROCHLOROTHIAZIDE 2.5-6.25 MG PO TABS: 1.0000 | ORAL_TABLET | Freq: Every day | ORAL | Status: DC

## 2011-10-16 NOTE — Patient Instructions (Signed)
The patient is instructed to continue all medications as prescribed. Schedule followup with check out clerk upon leaving the clinic  

## 2011-10-16 NOTE — Progress Notes (Signed)
Subjective:    Patient ID: Amanda Eaton, female    DOB: 1956/03/02, 56 y.o.   MRN: [id number]  HPI The patient is a 56 year old female who presents for followup of diabetes hyperlipidemia and hypertension.  She has now lost 20 pounds her blood pressure stable we are monitoring hemoglobin A1c a basic metabolic panel today and will adjust her medications as appropriate I would continue the blood pressure medicines she has a strong family history and it does not appear to be related to weight   Review of Systems  Constitutional: Negative for activity change, appetite change and fatigue.  HENT: Negative for ear pain, congestion, neck pain, postnasal drip and sinus pressure.   Eyes: Negative for redness and visual disturbance.  Respiratory: Negative for cough, shortness of breath and wheezing.   Gastrointestinal: Negative for abdominal pain and abdominal distention.  Genitourinary: Negative for dysuria, frequency and menstrual problem.  Musculoskeletal: Negative for myalgias, joint swelling and arthralgias.  Skin: Negative for rash and wound.  Neurological: Negative for dizziness, weakness and headaches.  Hematological: Negative for adenopathy. Does not bruise/bleed easily.  Psychiatric/Behavioral: Negative for disturbed wake/sleep cycle and decreased concentration.   Past Medical History  Diagnosis Date  . DIABETES MELLITUS, TYPE II 11/23/2006  . HYPERLIPIDEMIA, BORDERLINE 04/08/2008  . HYPERTENSION 11/23/2006  . WEIGHT GAIN 10/12/2008    History   Social History  . Marital Status: Married    Spouse Name: N/A    Number of Children: N/A  . Years of Education: N/A   Occupational History  . Not on file.   Social History Main Topics  . Smoking status: Never Smoker   . Smokeless tobacco: Not on file  . Alcohol Use: Yes  . Drug Use: No  . Sexually Active: Yes   Other Topics Concern  . Not on file   Social History Narrative  . No narrative on file    Past Surgical History    Procedure Date  . Hysteroscopy w/ endometrial ablation     1987    Family History  Problem Relation Age of Onset  . Diabetes Mother   . Hyperlipidemia Mother   . Hypertension Mother   . COPD Father     No Known Allergies  Current Outpatient Prescriptions on File Prior to Visit  Medication Sig Dispense Refill  . OMEGA-3 KRILL OIL 300 MG CAPS Take 1 capsule (300 mg total) by mouth 2 (two) times daily.      . SitaGLIPtin-MetFORMIN HCl [phone] MG TB24 Take 1 tablet by mouth daily.  30 tablet  11  . DISCONTD: bisoprolol-hydrochlorothiazide (ZIAC) 2.5-6.25 MG per tablet TAKE ONE TABLET BY MOUTH EVERY DAY  30 tablet  5    BP 132/84  Pulse 72  Temp 98.2 F (36.8 C)  Resp 16  Ht 5\' 6"  (1.676 m)  Wt 190 lb (86.183 kg)  BMI 30.67 kg/m2        Objective:   Physical Exam  Nursing note and vitals reviewed. Constitutional: She is oriented to person, place, and time. She appears well-developed and well-nourished. No distress.  HENT:  Head: Normocephalic and atraumatic.  Right Ear: External ear normal.  Left Ear: External ear normal.  Nose: Nose normal.  Mouth/Throat: Oropharynx is clear and moist.  Eyes: Conjunctivae and EOM are normal. Pupils are equal, round, and reactive to light.  Neck: Normal range of motion. Neck supple. No JVD present. No tracheal deviation present. No thyromegaly present.  Cardiovascular: Normal rate, regular rhythm, normal heart sounds and  intact distal pulses.   No murmur heard. Pulmonary/Chest: Effort normal and breath sounds normal. She has no wheezes. She exhibits no tenderness.  Abdominal: Soft. Bowel sounds are normal.  Musculoskeletal: Normal range of motion. She exhibits no edema and no tenderness.  Lymphadenopathy:    She has no cervical adenopathy.  Neurological: She is alert and oriented to person, place, and time. She has normal reflexes. No cranial nerve deficit.  Skin: Skin is warm and dry. She is not diaphoretic.  Psychiatric: She  has a normal mood and affect. Her behavior is normal.          Assessment & Plan:  Patient's loss has led to the probable need to titrate her diabetic medication.  We will check hemoglobin A1c today and consider reducing the Janumet from 100/1000 We will also look at a basic metabolic panel believe she is stable on her Z. at however we will monitor her potassium and renal function.

## 2011-11-27 ENCOUNTER — Telehealth: Payer: Self-pay | Admitting: Internal Medicine

## 2011-11-27 MED ORDER — SITAGLIP PHOS-METFORMIN HCL ER 100-1000 MG PO TB24: 1.0000 | ORAL_TABLET | Freq: Every day | ORAL | Status: DC

## 2011-11-27 MED ORDER — BISOPROLOL-HYDROCHLOROTHIAZIDE 2.5-6.25 MG PO TABS: 1.0000 | ORAL_TABLET | Freq: Every day | ORAL | Status: DC

## 2011-11-27 NOTE — Telephone Encounter (Signed)
Patient called stating that she need a 90 day refill of her janumet and ziac sent to [facility] mail order pharmacy Ph. [phone]. Please assist.

## 2012-01-30 ENCOUNTER — Other Ambulatory Visit: Payer: Self-pay | Admitting: Internal Medicine

## 2012-01-30 DIAGNOSIS — Z1231 Encounter for screening mammogram for malignant neoplasm of breast: Secondary | ICD-10-CM

## 2012-03-05 ENCOUNTER — Ambulatory Visit (HOSPITAL_COMMUNITY)
Admission: RE | Admit: 2012-03-05 | Discharge: 2012-03-05 | Disposition: A | Discharge status: Home / Self Care | Payer: Managed Care, Other (non HMO) | Source: Ambulatory Visit | Attending: Internal Medicine | Admitting: Internal Medicine

## 2012-03-05 DIAGNOSIS — Z1231 Encounter for screening mammogram for malignant neoplasm of breast: Secondary | ICD-10-CM | POA: Insufficient documentation

## 2012-04-23 ENCOUNTER — Telehealth: Payer: Self-pay | Admitting: Internal Medicine

## 2012-04-23 NOTE — Telephone Encounter (Signed)
Pt needs follow up appt for meds only.  Scheduled w/ Adline Mango. Is that OK?

## 2012-04-23 NOTE — Telephone Encounter (Signed)
yes

## 2012-05-01 ENCOUNTER — Encounter: Payer: Self-pay | Admitting: Family

## 2012-05-01 ENCOUNTER — Ambulatory Visit (INDEPENDENT_AMBULATORY_CARE_PROVIDER_SITE_OTHER): Payer: Managed Care, Other (non HMO) | Admitting: Family

## 2012-05-01 VITALS — BP 102/68 | HR 57 | Ht 66.0 in | Wt 187.0 lb

## 2012-05-01 DIAGNOSIS — I10 Essential (primary) hypertension: Secondary | ICD-10-CM

## 2012-05-01 DIAGNOSIS — Z23 Encounter for immunization: Secondary | ICD-10-CM

## 2012-05-01 DIAGNOSIS — E119 Type 2 diabetes mellitus without complications: Secondary | ICD-10-CM

## 2012-05-01 NOTE — Progress Notes (Signed)
  Subjective:    Patient ID: Amanda Eaton, female    DOB: [birth year], 57 y.o.   MRN: [id number]  HPI  57 year old white female, nonsmoker, patient of Dr. Lovell Sheehan is in today for recheck of type 2 diabetes and hypertension. She does not routinely check her blood sugars. Does not exercise. She is due for her annual eye exam.    Review of Systems  Constitutional: Negative.   HENT: Negative.   Respiratory: Negative.   Cardiovascular: Negative.   Gastrointestinal: Negative.   Genitourinary: Negative.   Musculoskeletal: Negative.   Skin: Negative.   Neurological: Negative.   Hematological: Negative.   Psychiatric/Behavioral: Negative.    Past Medical History  Diagnosis Date  . DIABETES MELLITUS, TYPE II 11/23/2006  . HYPERLIPIDEMIA, BORDERLINE 04/08/2008  . HYPERTENSION 11/23/2006  . WEIGHT GAIN 10/12/2008    History   Social History  . Marital Status: Married    Spouse Name: N/A    Number of Children: N/A  . Years of Education: N/A   Occupational History  . Not on file.   Social History Main Topics  . Smoking status: Never Smoker   . Smokeless tobacco: Not on file  . Alcohol Use: Yes  . Drug Use: No  . Sexually Active: Yes   Other Topics Concern  . Not on file   Social History Narrative  . No narrative on file    Past Surgical History  Procedure Date  . Hysteroscopy w/ endometrial ablation     1987    Family History  Problem Relation Age of Onset  . Diabetes Mother   . Hyperlipidemia Mother   . Hypertension Mother   . COPD Father     No Known Allergies  Current Outpatient Prescriptions on File Prior to Visit  Medication Sig Dispense Refill  . bisoprolol-hydrochlorothiazide (ZIAC) 2.5-6.25 MG per tablet Take 1 tablet by mouth daily.  90 tablet  3  . OMEGA-3 KRILL OIL 300 MG CAPS Take 1 capsule (300 mg total) by mouth 2 (two) times daily.      . SitaGLIPtin-MetFORMIN HCl [phone] MG TB24 Take 1 tablet by mouth daily.  90 tablet  3    BP 102/68  Pulse 57   Ht 5\' 6"  (1.676 m)  Wt 187 lb (84.823 kg)  BMI 30.18 kg/m2  SpO2 98%chart     Objective:   Physical Exam  Constitutional: She is oriented to person, place, and time. She appears well-developed and well-nourished.  HENT:  Right Ear: External ear normal.  Left Ear: External ear normal.  Nose: Nose normal.  Mouth/Throat: Oropharynx is clear and moist.  Neck: Normal range of motion. Neck supple.  Cardiovascular: Normal rate, regular rhythm and normal heart sounds.   Pulmonary/Chest: Effort normal and breath sounds normal.  Abdominal: Soft. Bowel sounds are normal.  Neurological: She is alert and oriented to person, place, and time.  Skin: Skin is warm and dry.  Psychiatric: She has a normal mood and affect.          Assessment & Plan:  Assessment: Type 2 diabetes, Hypertension  Plan: Encouraged annual diabetic eye exam. Nightly feet checks. Encouraged healthy diet and exercise. Patient will return next week for fasting labs. Continue current medications. Recheck with Dr. Lovell Sheehan in 6 months and as needed sooner.

## 2012-05-01 NOTE — Patient Instructions (Addendum)
Diabetes and Exercise  Regular exercise is important and can help:   · Control blood glucose (sugar).  · Decrease blood pressure.  ·   · Control blood lipids (cholesterol, triglycerides).  · Improve overall health.  BENEFITS FROM EXERCISE  · Improved fitness.  · Improved flexibility.  · Improved endurance.  · Increased bone density.  · Weight control.  · Increased muscle strength.  · Decreased body fat.  · Improvement of the body's use of insulin, a hormone.  · Increased insulin sensitivity.  · Reduction of insulin needs.  · Reduced stress and tension.  · Helps you feel better.  People with diabetes who add exercise to their lifestyle gain additional benefits, including:  · Weight loss.  · Reduced appetite.  · Improvement of the body's use of blood glucose.  · Decreased risk factors for heart disease:  · Lowering of cholesterol and triglycerides.  · Raising the level of good cholesterol (high-density lipoproteins, HDL).  · Lowering blood sugar.  · Decreased blood pressure.  TYPE 1 DIABETES AND EXERCISE  · Exercise will usually lower your blood glucose.  · If blood glucose is greater than 240 mg/dl, check urine ketones. If ketones are present, do not exercise.  · Location of the insulin injection sites may need to be adjusted with exercise. Avoid injecting insulin into areas of the body that will be exercised. For example, avoid injecting insulin into:  · The arms when playing tennis.  · The legs when jogging. For more information, discuss this with your caregiver.  · Keep a record of:  · Food intake.  · Type and amount of exercise.  · Expected peak times of insulin action.  · Blood glucose levels.  Do this before, during, and after exercise. Review your records with your caregiver. This will help you to develop guidelines for adjusting food intake and insulin amounts.   TYPE 2 DIABETES AND EXERCISE  · Regular physical activity can help control blood glucose.  · Exercise is important because it may:  · Increase the  body's sensitivity to insulin.  · Improve blood glucose control.  · Exercise reduces the risk of heart disease. It decreases serum cholesterol and triglycerides. It also lowers blood pressure.  · Those who take insulin or oral hypoglycemic agents should watch for signs of hypoglycemia. These signs include dizziness, shaking, sweating, chills, and confusion.  · Body water is lost during exercise. It must be replaced. This will help to avoid loss of body fluids (dehydration) or heat stroke.  Be sure to talk to your caregiver before starting an exercise program to make sure it is safe for you. Remember, any activity is better than none.   Document Released: 07/01/2003 Document Revised: 07/03/2011 Document Reviewed: 10/15/2008  [employer]® Patient Information ©2013 [employer].

## 2012-05-02 ENCOUNTER — Telehealth: Payer: Self-pay

## 2012-05-02 DIAGNOSIS — I10 Essential (primary) hypertension: Secondary | ICD-10-CM

## 2012-05-02 DIAGNOSIS — E785 Hyperlipidemia, unspecified: Secondary | ICD-10-CM

## 2012-05-02 DIAGNOSIS — E119 Type 2 diabetes mellitus without complications: Secondary | ICD-10-CM

## 2012-05-02 NOTE — Telephone Encounter (Signed)
done

## 2012-05-02 NOTE — Telephone Encounter (Signed)
Orders placed for labs

## 2012-05-09 ENCOUNTER — Other Ambulatory Visit (INDEPENDENT_AMBULATORY_CARE_PROVIDER_SITE_OTHER): Payer: Managed Care, Other (non HMO)

## 2012-05-09 DIAGNOSIS — I10 Essential (primary) hypertension: Secondary | ICD-10-CM

## 2012-05-09 DIAGNOSIS — E785 Hyperlipidemia, unspecified: Secondary | ICD-10-CM

## 2012-05-09 DIAGNOSIS — E119 Type 2 diabetes mellitus without complications: Secondary | ICD-10-CM

## 2012-05-09 LAB — HEPATIC FUNCTION PANEL
ALT: 15 U/L (ref 0–35)
AST: 16 U/L (ref 0–37)
Albumin: 3.8 g/dL (ref 3.5–5.2)
Alkaline Phosphatase: 42 U/L (ref 39–117)

## 2012-05-09 LAB — BASIC METABOLIC PANEL
CO2: 29 mEq/L (ref 19–32)
Chloride: 105 mEq/L (ref 96–112)
GFR: 94.53 mL/min (ref >60.00)
Glucose, Bld: 100 mg/dL — ABNORMAL HIGH (ref 70–99)
Potassium: 3.5 mEq/L (ref 3.5–5.1)
Sodium: 139 mEq/L (ref 135–145)

## 2012-05-09 LAB — LIPID PANEL
Cholesterol: 168 mg/dL (ref 0–200)
LDL Cholesterol: 102 mg/dL — ABNORMAL HIGH (ref 0–99)
Triglycerides: 64 mg/dL (ref 0.0–149.0)

## 2012-05-13 ENCOUNTER — Telehealth: Payer: Self-pay | Admitting: Internal Medicine

## 2012-05-13 NOTE — Telephone Encounter (Signed)
Pt aware of results 

## 2012-05-13 NOTE — Telephone Encounter (Signed)
Pt would like results of labs. Needs for her job. OK to leave message.

## 2012-07-08 ENCOUNTER — Other Ambulatory Visit: Payer: Self-pay | Admitting: Obstetrics & Gynecology

## 2012-07-09 ENCOUNTER — Encounter (HOSPITAL_COMMUNITY): Payer: Self-pay | Admitting: Pharmacist

## 2012-07-15 ENCOUNTER — Encounter (HOSPITAL_COMMUNITY): Payer: Self-pay

## 2012-07-15 ENCOUNTER — Encounter (HOSPITAL_COMMUNITY)
Admission: RE | Admit: 2012-07-15 | Discharge: 2012-07-15 | Disposition: A | Discharge status: Home / Self Care | Payer: Managed Care, Other (non HMO) | Source: Ambulatory Visit | Attending: Obstetrics & Gynecology | Admitting: Obstetrics & Gynecology

## 2012-07-15 LAB — COMPREHENSIVE METABOLIC PANEL
ALT: 12 U/L (ref 0–35)
AST: 14 U/L (ref 0–37)
Albumin: 3.6 g/dL (ref 3.5–5.2)
CO2: 29 mEq/L (ref 19–32)
Calcium: 9.6 mg/dL (ref 8.4–10.5)
Chloride: 104 mEq/L (ref 96–112)
GFR calc non Af Amer: >90 mL/min (ref >90)
Sodium: 141 mEq/L (ref 135–145)
Total Bilirubin: 0.3 mg/dL (ref 0.3–1.2)

## 2012-07-15 LAB — CBC
Platelets: 200 10*3/uL (ref 150–400)
RBC: 4.33 MIL/uL (ref 3.87–5.11)
WBC: 5.1 10*3/uL (ref 4.0–10.5)

## 2012-07-15 NOTE — Patient Instructions (Addendum)
Your procedure is scheduled on:06/2612  Enter through the Main Entrance at :1200 noon Pick up desk phone and dial [phone] and inform us of your arrival.  Please call [phone] if you have any problems the morning of surgery.  Remember: Do not eat after midnight: Tuesday Clear liquids ok until 9:30 am on WED   Take these meds the morning of surgery with a sip of water: BP med  Hold Janumet for 24 hours prior to surgery  DO NOT wear jewelry, eye make-up, lipstick,body lotion, or dark fingernail polish. Do not shave for 48 hours prior to surgery.  If you are to be admitted after surgery, leave suitcase in car until your room has been assigned. Patients discharged on the day of surgery will not be allowed to drive home.

## 2012-07-17 ENCOUNTER — Encounter (HOSPITAL_COMMUNITY): Payer: Self-pay | Admitting: Anesthesiology

## 2012-07-17 ENCOUNTER — Ambulatory Visit (HOSPITAL_COMMUNITY): Payer: Managed Care, Other (non HMO) | Admitting: Anesthesiology

## 2012-07-17 ENCOUNTER — Observation Stay (HOSPITAL_COMMUNITY)
Admission: RE | Admit: 2012-07-17 | Discharge: 2012-07-18 | Disposition: A | Discharge status: Home / Self Care | Payer: Managed Care, Other (non HMO) | Source: Ambulatory Visit | Attending: Obstetrics & Gynecology | Admitting: Obstetrics & Gynecology

## 2012-07-17 ENCOUNTER — Encounter (HOSPITAL_COMMUNITY)
Admission: RE | Disposition: A | Discharge status: Home / Self Care | Payer: Self-pay | Source: Ambulatory Visit | Attending: Obstetrics & Gynecology

## 2012-07-17 DIAGNOSIS — I10 Essential (primary) hypertension: Secondary | ICD-10-CM | POA: Insufficient documentation

## 2012-07-17 DIAGNOSIS — D252 Subserosal leiomyoma of uterus: Secondary | ICD-10-CM | POA: Insufficient documentation

## 2012-07-17 DIAGNOSIS — D251 Intramural leiomyoma of uterus: Secondary | ICD-10-CM | POA: Insufficient documentation

## 2012-07-17 DIAGNOSIS — Z90710 Acquired absence of both cervix and uterus: Secondary | ICD-10-CM | POA: Diagnosis not present

## 2012-07-17 DIAGNOSIS — D250 Submucous leiomyoma of uterus: Secondary | ICD-10-CM | POA: Insufficient documentation

## 2012-07-17 DIAGNOSIS — E119 Type 2 diabetes mellitus without complications: Secondary | ICD-10-CM | POA: Insufficient documentation

## 2012-07-17 DIAGNOSIS — N812 Incomplete uterovaginal prolapse: Principal | ICD-10-CM | POA: Insufficient documentation

## 2012-07-17 HISTORY — PX: ANTERIOR AND POSTERIOR REPAIR: SHX5121

## 2012-07-17 HISTORY — PX: ROBOTIC ASSISTED TOTAL HYSTERECTOMY: SHX6085

## 2012-07-17 LAB — GLUCOSE, CAPILLARY: Glucose-Capillary: 91 mg/dL (ref 70–99)

## 2012-07-17 SURGERY — ROBOTIC ASSISTED TOTAL HYSTERECTOMY
Anesthesia: General | Site: Vagina | Wound class: Clean Contaminated

## 2012-07-17 MED ORDER — GLYCOPYRROLATE 0.2 MG/ML IJ SOLN
INTRAMUSCULAR | Status: DC | PRN
  Administered 2012-07-17: 0.2 mg via INTRAVENOUS
  Administered 2012-07-17: 0.4 mg via INTRAVENOUS

## 2012-07-17 MED ORDER — FENTANYL CITRATE 0.05 MG/ML IJ SOLN
INTRAMUSCULAR | Status: AC
  Filled 2012-07-17: qty 5

## 2012-07-17 MED ORDER — ROCURONIUM BROMIDE 100 MG/10ML IV SOLN
INTRAVENOUS | Status: DC | PRN
  Administered 2012-07-17: 10 mg via INTRAVENOUS
  Administered 2012-07-17: 50 mg via INTRAVENOUS
  Administered 2012-07-17 (×2): 10 mg via INTRAVENOUS

## 2012-07-17 MED ORDER — BISOPROLOL-HYDROCHLOROTHIAZIDE 2.5-6.25 MG PO TABS
1.0000 | ORAL_TABLET | Freq: Every day | ORAL | Status: DC
  Filled 2012-07-17: qty 1

## 2012-07-17 MED ORDER — FENTANYL CITRATE 0.05 MG/ML IJ SOLN
INTRAMUSCULAR | Status: AC
  Filled 2012-07-17: qty 2

## 2012-07-17 MED ORDER — PNEUMOCOCCAL VAC POLYVALENT 25 MCG/0.5ML IJ INJ
0.5000 mL | INJECTION | INTRAMUSCULAR | Status: AC
  Administered 2012-07-18: 0.5 mL via INTRAMUSCULAR
  Filled 2012-07-17: qty 0.5

## 2012-07-17 MED ORDER — LACTATED RINGERS IV SOLN
INTRAVENOUS | Status: DC
  Administered 2012-07-17: 20:00:00 via INTRAVENOUS

## 2012-07-17 MED ORDER — KETOROLAC TROMETHAMINE 30 MG/ML IJ SOLN: 15.0000 mg | Freq: Once | INTRAMUSCULAR | Status: DC | PRN

## 2012-07-17 MED ORDER — METFORMIN HCL ER 500 MG PO TB24
1000.0000 mg | ORAL_TABLET | Freq: Every day | ORAL | Status: DC
  Administered 2012-07-18: 1000 mg via ORAL
  Filled 2012-07-17: qty 2

## 2012-07-17 MED ORDER — NEOSTIGMINE METHYLSULFATE 1 MG/ML IJ SOLN
INTRAMUSCULAR | Status: AC
  Filled 2012-07-17: qty 1

## 2012-07-17 MED ORDER — HYDROMORPHONE HCL PF 1 MG/ML IJ SOLN
1.0000 mg | INTRAMUSCULAR | Status: DC | PRN
  Administered 2012-07-17: 1 mg via INTRAVENOUS
  Filled 2012-07-17: qty 1

## 2012-07-17 MED ORDER — OXYCODONE-ACETAMINOPHEN 5-325 MG PO TABS
1.0000 | ORAL_TABLET | ORAL | Status: DC | PRN
  Administered 2012-07-18: 1 via ORAL
  Filled 2012-07-17: qty 1

## 2012-07-17 MED ORDER — PHENYLEPHRINE 40 MCG/ML (10ML) SYRINGE FOR IV PUSH (FOR BLOOD PRESSURE SUPPORT)
PREFILLED_SYRINGE | INTRAVENOUS | Status: AC
  Filled 2012-07-17: qty 5

## 2012-07-17 MED ORDER — ACETAMINOPHEN 10 MG/ML IV SOLN
1000.0000 mg | Freq: Once | INTRAVENOUS | Status: AC
  Administered 2012-07-17: 1000 mg via INTRAVENOUS

## 2012-07-17 MED ORDER — DEXAMETHASONE SODIUM PHOSPHATE 4 MG/ML IJ SOLN
INTRAMUSCULAR | Status: DC | PRN
  Administered 2012-07-17: 8 mg via INTRAVENOUS

## 2012-07-17 MED ORDER — MIDAZOLAM HCL 2 MG/2ML IJ SOLN
INTRAMUSCULAR | Status: AC
  Filled 2012-07-17: qty 2

## 2012-07-17 MED ORDER — IBUPROFEN 600 MG PO TABS
600.0000 mg | ORAL_TABLET | Freq: Four times a day (QID) | ORAL | Status: DC | PRN
  Administered 2012-07-18: 600 mg via ORAL
  Filled 2012-07-17 (×2): qty 1

## 2012-07-17 MED ORDER — EPHEDRINE SULFATE 50 MG/ML IJ SOLN
INTRAMUSCULAR | Status: DC | PRN
  Administered 2012-07-17: 10 mg via INTRAVENOUS

## 2012-07-17 MED ORDER — SITAGLIP PHOS-METFORMIN HCL ER 100-1000 MG PO TB24: 1.0000 | ORAL_TABLET | Freq: Every day | ORAL | Status: DC

## 2012-07-17 MED ORDER — ONDANSETRON HCL 4 MG/2ML IJ SOLN
INTRAMUSCULAR | Status: DC | PRN
  Administered 2012-07-17: 4 mg via INTRAVENOUS

## 2012-07-17 MED ORDER — BUPIVACAINE HCL (PF) 0.25 % IJ SOLN
INTRAMUSCULAR | Status: AC
  Filled 2012-07-17: qty 30

## 2012-07-17 MED ORDER — LIDOCAINE HCL (CARDIAC) 20 MG/ML IV SOLN
INTRAVENOUS | Status: AC
  Filled 2012-07-17: qty 5

## 2012-07-17 MED ORDER — LACTATED RINGERS IV SOLN
INTRAVENOUS | Status: DC
  Administered 2012-07-17 (×2): via INTRAVENOUS

## 2012-07-17 MED ORDER — ROCURONIUM BROMIDE 50 MG/5ML IV SOLN
INTRAVENOUS | Status: AC
  Filled 2012-07-17: qty 1

## 2012-07-17 MED ORDER — LINAGLIPTIN 5 MG PO TABS
5.0000 mg | ORAL_TABLET | Freq: Every day | ORAL | Status: DC
  Administered 2012-07-18: 5 mg via ORAL
  Filled 2012-07-17: qty 1

## 2012-07-17 MED ORDER — KETOROLAC TROMETHAMINE 30 MG/ML IJ SOLN
INTRAMUSCULAR | Status: DC | PRN
  Administered 2012-07-17: 30 mg via INTRAVENOUS

## 2012-07-17 MED ORDER — LIDOCAINE HCL (CARDIAC) 20 MG/ML IV SOLN
INTRAVENOUS | Status: DC | PRN
  Administered 2012-07-17: 50 mg via INTRAVENOUS

## 2012-07-17 MED ORDER — FENTANYL CITRATE 0.05 MG/ML IJ SOLN
INTRAMUSCULAR | Status: AC
  Administered 2012-07-17: 50 ug via INTRAVENOUS
  Filled 2012-07-17: qty 2

## 2012-07-17 MED ORDER — CEFAZOLIN SODIUM-DEXTROSE 2-3 GM-% IV SOLR
INTRAVENOUS | Status: AC
  Filled 2012-07-17: qty 50

## 2012-07-17 MED ORDER — LIDOCAINE-EPINEPHRINE 1 %-1:100000 IJ SOLN
INTRAMUSCULAR | Status: DC | PRN
  Administered 2012-07-17: 9 mL

## 2012-07-17 MED ORDER — PROPOFOL 10 MG/ML IV EMUL
INTRAVENOUS | Status: AC
  Filled 2012-07-17: qty 20

## 2012-07-17 MED ORDER — ACETAMINOPHEN 10 MG/ML IV SOLN
INTRAVENOUS | Status: AC
  Filled 2012-07-17: qty 100

## 2012-07-17 MED ORDER — BUPIVACAINE HCL (PF) 0.25 % IJ SOLN
INTRAMUSCULAR | Status: DC | PRN
  Administered 2012-07-17: 6 mL

## 2012-07-17 MED ORDER — PROPOFOL 10 MG/ML IV EMUL
INTRAVENOUS | Status: DC | PRN
  Administered 2012-07-17: 150 mg via INTRAVENOUS

## 2012-07-17 MED ORDER — ONDANSETRON HCL 4 MG/2ML IJ SOLN
INTRAMUSCULAR | Status: AC
  Filled 2012-07-17: qty 2

## 2012-07-17 MED ORDER — DEXAMETHASONE SODIUM PHOSPHATE 10 MG/ML IJ SOLN
INTRAMUSCULAR | Status: AC
  Filled 2012-07-17: qty 1

## 2012-07-17 MED ORDER — FENTANYL CITRATE 0.05 MG/ML IJ SOLN
INTRAMUSCULAR | Status: DC | PRN
  Administered 2012-07-17 (×2): 50 ug via INTRAVENOUS
  Administered 2012-07-17: 100 ug via INTRAVENOUS
  Administered 2012-07-17: 50 ug via INTRAVENOUS

## 2012-07-17 MED ORDER — NEOSTIGMINE METHYLSULFATE 1 MG/ML IJ SOLN
INTRAMUSCULAR | Status: DC | PRN
  Administered 2012-07-17: 2 mg via INTRAVENOUS

## 2012-07-17 MED ORDER — DEXTROSE 5 % IV SOLN: 1.5000 mg/kg | Freq: Once | INTRAVENOUS | Status: DC

## 2012-07-17 MED ORDER — GLYCOPYRROLATE 0.2 MG/ML IJ SOLN
INTRAMUSCULAR | Status: AC
  Filled 2012-07-17: qty 3

## 2012-07-17 MED ORDER — LACTATED RINGERS IR SOLN
Status: DC | PRN
  Administered 2012-07-17: 3000 mL

## 2012-07-17 MED ORDER — CEFAZOLIN SODIUM-DEXTROSE 2-3 GM-% IV SOLR
2.0000 g | Freq: Once | INTRAVENOUS | Status: AC
  Administered 2012-07-17: 2 g via INTRAVENOUS

## 2012-07-17 MED ORDER — VASOPRESSIN 20 UNIT/ML IJ SOLN
INTRAMUSCULAR | Status: AC
  Filled 2012-07-17: qty 1

## 2012-07-17 MED ORDER — FENTANYL CITRATE 0.05 MG/ML IJ SOLN: 25.0000 ug | INTRAMUSCULAR | Status: DC | PRN

## 2012-07-17 MED ORDER — SODIUM CHLORIDE 0.9 % IV SOLN: 2.0000 g | Freq: Once | INTRAVENOUS | Status: DC

## 2012-07-17 MED ORDER — PHENYLEPHRINE HCL 10 MG/ML IJ SOLN
INTRAMUSCULAR | Status: DC | PRN
  Administered 2012-07-17 (×2): 40 ug via INTRAVENOUS
  Administered 2012-07-17: 80 ug via INTRAVENOUS
  Administered 2012-07-17 (×4): 40 ug via INTRAVENOUS

## 2012-07-17 MED ORDER — MIDAZOLAM HCL 5 MG/5ML IJ SOLN
INTRAMUSCULAR | Status: DC | PRN
  Administered 2012-07-17: 2 mg via INTRAVENOUS

## 2012-07-17 SURGICAL SUPPLY — 83 items
ADH SKN CLS APL DERMABOND .7 (GAUZE/BANDAGES/DRESSINGS) ×4
BAG URINE DRAINAGE (UROLOGICAL SUPPLIES) ×3 IMPLANT
BARRIER ADHS 3X4 INTERCEED (GAUZE/BANDAGES/DRESSINGS) ×3 IMPLANT
BLADE LAPAROSCOPIC MORCELL KIT (BLADE) IMPLANT
BLADE SURG 15 STRL LF C SS BP (BLADE) ×2 IMPLANT
BLADE SURG 15 STRL SS (BLADE) ×3
BRR ADH 4X3 ABS CNTRL BYND (GAUZE/BANDAGES/DRESSINGS) ×2
CATH FOLEY 3WAY  5CC 16FR (CATHETERS) ×1
CATH FOLEY 3WAY 5CC 16FR (CATHETERS) ×2 IMPLANT
CLOTH BEACON ORANGE TIMEOUT ST (SAFETY) ×3 IMPLANT
CONT PATH 16OZ SNAP LID 3702 (MISCELLANEOUS) ×3 IMPLANT
COVER MAYO STAND STRL (DRAPES) ×3 IMPLANT
COVER TABLE BACK 60X90 (DRAPES) ×6 IMPLANT
COVER TIP SHEARS 8 DVNC (MISCELLANEOUS) ×2 IMPLANT
COVER TIP SHEARS 8MM DA VINCI (MISCELLANEOUS) ×1
DECANTER SPIKE VIAL GLASS SM (MISCELLANEOUS) ×3 IMPLANT
DERMABOND ADVANCED (GAUZE/BANDAGES/DRESSINGS) ×2
DERMABOND ADVANCED .7 DNX12 (GAUZE/BANDAGES/DRESSINGS) ×4 IMPLANT
DRAPE HUG U DISPOSABLE (DRAPE) ×3 IMPLANT
DRAPE LG THREE QUARTER DISP (DRAPES) ×6 IMPLANT
DRAPE WARM FLUID 44X44 (DRAPE) ×3 IMPLANT
ELECT REM PT RETURN 9FT ADLT (ELECTROSURGICAL) ×3
ELECTRODE REM PT RTRN 9FT ADLT (ELECTROSURGICAL) ×2 IMPLANT
EVACUATOR SMOKE 8.L (FILTER) ×3 IMPLANT
GAUZE VASELINE 3X9 (GAUZE/BANDAGES/DRESSINGS) IMPLANT
GLOVE BIO SURGEON STRL SZ 6.5 (GLOVE) ×3 IMPLANT
GLOVE BIO SURGEON STRL SZ7 (GLOVE) ×3 IMPLANT
GLOVE BIOGEL PI IND STRL 7.0 (GLOVE) ×2 IMPLANT
GLOVE BIOGEL PI INDICATOR 7.0 (GLOVE) ×1
GOWN STRL REIN XL XLG (GOWN DISPOSABLE) ×18 IMPLANT
IV STOPCOCK 4 WAY 40  W/Y SET (IV SOLUTION)
IV STOPCOCK 4 WAY 40 W/Y SET (IV SOLUTION) IMPLANT
KIT ACCESSORY DA VINCI DISP (KITS) ×1
KIT ACCESSORY DVNC DISP (KITS) ×2 IMPLANT
LEGGING LITHOTOMY PAIR STRL (DRAPES) ×3 IMPLANT
NDL SPNL 22GX3.5 QUINCKE BK (NEEDLE) IMPLANT
NEEDLE HYPO 22GX1.5 SAFETY (NEEDLE) ×3 IMPLANT
NEEDLE SPNL 22GX3.5 QUINCKE BK (NEEDLE) IMPLANT
OCCLUDER COLPOPNEUMO (BALLOONS) IMPLANT
PACK LAVH (CUSTOM PROCEDURE TRAY) ×3 IMPLANT
PACK VAGINAL WOMENS (CUSTOM PROCEDURE TRAY) ×3 IMPLANT
PAD PREP 24X48 CUFFED NSTRL (MISCELLANEOUS) ×6 IMPLANT
PLUG CATH AND CAP STER (CATHETERS) ×3 IMPLANT
PROTECTOR NERVE ULNAR (MISCELLANEOUS) ×6 IMPLANT
SET CYSTO W/LG BORE CLAMP LF (SET/KITS/TRAYS/PACK) IMPLANT
SET IRRIG TUBING LAPAROSCOPIC (IRRIGATION / IRRIGATOR) ×6 IMPLANT
SOLUTION ELECTROLUBE (MISCELLANEOUS) ×3 IMPLANT
SUT VIC AB 0 CT1 27 (SUTURE) ×12
SUT VIC AB 0 CT1 27XBRD ANBCTR (SUTURE) IMPLANT
SUT VIC AB 0 CT1 27XBRD ANTBC (SUTURE) IMPLANT
SUT VIC AB 2-0 CT1 27 (SUTURE) ×6
SUT VIC AB 2-0 CT1 TAPERPNT 27 (SUTURE) ×4 IMPLANT
SUT VIC AB 2-0 CT2 27 (SUTURE) IMPLANT
SUT VIC AB 2-0 SH 27 (SUTURE) ×9
SUT VIC AB 2-0 SH 27XBRD (SUTURE) ×6 IMPLANT
SUT VIC AB 2-0 UR5 27 (SUTURE) ×12 IMPLANT
SUT VIC AB 3-0 SH 27 (SUTURE)
SUT VIC AB 3-0 SH 27X BRD (SUTURE) IMPLANT
SUT VIC AB 4-0 PS2 18 (SUTURE) ×2 IMPLANT
SUT VIC AB 4-0 PS2 27 (SUTURE) ×6 IMPLANT
SUT VICRYL 0 27 CT2 27 ABS (SUTURE) IMPLANT
SUT VICRYL 0 UR6 27IN ABS (SUTURE) ×8 IMPLANT
SUT VICRYL 2 0 18  UND BR (SUTURE)
SUT VICRYL 2 0 18 UND BR (SUTURE) IMPLANT
SUT VLOC 180 0 9IN  GS21 (SUTURE) ×2
SUT VLOC 180 0 9IN GS21 (SUTURE) IMPLANT
SYR 50ML LL SCALE MARK (SYRINGE) ×3 IMPLANT
SYSTEM CONVERTIBLE TROCAR (TROCAR) IMPLANT
TIP RUMI ORANGE 6.7MMX12CM (TIP) IMPLANT
TIP UTERINE 5.1X6CM LAV DISP (MISCELLANEOUS) IMPLANT
TIP UTERINE 6.7X10CM GRN DISP (MISCELLANEOUS) IMPLANT
TIP UTERINE 6.7X6CM WHT DISP (MISCELLANEOUS) IMPLANT
TIP UTERINE 6.7X8CM BLUE DISP (MISCELLANEOUS) ×1 IMPLANT
TOWEL OR 17X24 6PK STRL BLUE (TOWEL DISPOSABLE) ×9 IMPLANT
TRAY FOLEY CATH 14FR (SET/KITS/TRAYS/PACK) ×3 IMPLANT
TROCAR 12M 150ML BLUNT (TROCAR) IMPLANT
TROCAR DISP BLADELESS 8 DVNC (TROCAR) ×2 IMPLANT
TROCAR DISP BLADELESS 8MM (TROCAR) ×1
TROCAR XCEL 12X100 BLDLESS (ENDOMECHANICALS) ×3 IMPLANT
TROCAR XCEL NON-BLD 5MMX100MML (ENDOMECHANICALS) ×3 IMPLANT
TUBING FILTER THERMOFLATOR (ELECTROSURGICAL) ×6 IMPLANT
WARMER LAPAROSCOPE (MISCELLANEOUS) ×3 IMPLANT
WATER STERILE IRR 1000ML POUR (IV SOLUTION) ×9 IMPLANT

## 2012-07-17 NOTE — Op Note (Signed)
07/17/2012  4:29 PM  PATIENT:  Amanda Eaton  57 y.o. female  PRE-OPERATIVE DIAGNOSIS:  Symptomatic Uterine Prolapse, Cystorectocele  58570, 57260, uterine [id number]  POST-OPERATIVE DIAGNOSIS:  Symptomatic Uterine Prolapse, Cystorectocele  58570, 57260, uterine myomas [id number]  PROCEDURE:  Procedure(s): ROBOTIC ASSISTED TOTAL HYSTERECTOMY with Utero-sacral Ligament Suspension ANTERIOR (CYSTOCELE) AND POSTERIOR REPAIR (RECTOCELE)  SURGEON:  Surgeon(s): Genia Del, MD Lenoard Aden, MD  ASSISTANTS: Dr Lenoard Aden   ANESTHESIA:   general  PROCEDURE:   Under general anesthesia with endotracheal intubation. The patient is in lithotomy position. She is prepped with ChloraPrep on the abdomen and with Betadine on the suprapubic, vulvar and vaginal areas. She is draped as usual. Vaginally the vaginal exam reveals an anteverted uterus nodular, mobile no adnexal mass.  A grade 2-3 over 3 uterine prolapse is present with a grade 4 out of 4 cystocele and a grade 1 over 3 rectocele. The weighted speculum is inserted in the vagina. The anterior lip of the cervix is grasped with a tenaculum. The hysterometry is at 9 cm. We used a #8 roomy with a medium Coe ring with sharp put in place easily.  The Foley is put in place in the bladder. The other instruments are removed. Abdominally we infiltrate the subcutaneous tissue with Marcaine one quarter plain at the supraumbilical area. We make a 1.5 cm incision with a scalpel at that level. We opened the aponeurosis on the direct vision with Mayo scissors. We opened the parietal peritoneum bluntly with the finger. A pursestring stitch is put on the aponeurosis.  The Roseanne Reno was introduced at that level and a pneumoperitoneum is created with CO2. The camera is inserted. No adhesion with the anterior wall of the abdomen. The abdomen is normal to inspection.  The pelvis reveals a nodular uterus with on multiple myomas. Both tubes and both ovaries are normal to  inspection. Pictures are taken.  A semicircular configuration is used for port placement. Infiltration with Marcaine one quarter plain at each level. A small incision with the scalpel. Insertion of on ports under direct vision. 2 robotic ports are inserted on the right side, one robotic ports on the left lower side and the 5 mm assistant port on the upper left side.  The robot his docked from the right side. Instruments are put in place under direct vision including the Endo Shears scissor and the right arm and the PK in the left arm and the fenestrated clamp in the third arm.  We then go to the console. Both ureters are well visualized and normal anatomy position. We starts on the left side, we cauterized and sectioned the left round ligaments, we cauterized and section just under the tube in the left mesosalpinx, we cauterized and section the left utero-ovarian ligament and we then followed the left side of the uterus. We opened the visceral peritoneum anteriorly and started lowering the bladder. We proceed exactly the same way on the right side. We further lower the bladder anteriorly, passed the coe ring. We then cauterized the left uterine artery. We cauterized and sectioned the right uterine artery and we come back on the left side to section the left uterine artery. The uterus was blanching completely.  After filling up the occluder vaginally, we opened the vaginal cuff on top of the Coe ring anteriorly, on each side and finished posteriorly with the tip of the Endo Shears scissors.  The uterus was completely detached. We then used the [employer] in cutting  mode to partially detach the largest myoma and this way, pass the uterus vaginally easily.  Hemostasis was completed with the PK where necessary at the vaginal vault.  We then switched instruments, with the cutting needle driver in the right hand, the long tip in the left hand and the PK in the third arm.  The vaginal cuff was closed with a  running suture of V. LOC 0.  We then used a V. Lock 0 to suspend the uterosacral ligaments.  The left uterosacral ligament was sutured to the left angle of the vagina, we then went to the right angle of the vagina with the same stitch and sutured to the right uterosacral ligament.  Continuing with the same stitch we brought the 2 uterosacral ligaments together on the midline.  The ureters were localized during the suspension and were safely away from where the stitches were applied.  The needles were removed through the robotic port.  Hemostasis was adequate at all levels. We therefore removed all robotic instruments.  The robot was undocked. We went by laparoscopy.  Irrigation and suction was performed. Hemostasis was confirmed once more. All instruments were removed. All ports were removed under direct vision. The CO2 was evacuated. All incisions of were cauterized with the Bovie for hemostasis. The pursestring stitch was attached at the supraumbilical incision. Vicryl 4-0 was used on all incision in a subcuticular stitch to close the skin. Dermabond was added on all incisions.  We then went vaginally. The patient was repositioned for cystorectocele repair.  The vaginal mucosa was opened as about 2 cm from the vaginal vault anteriorly.  We opened on the midline anteriorly to about 2 cm from the urethra.  The scalpel and  scissors were used to open the vaginal mucosa. We then retracted the bladder from the vaginal mucosa.  We used a Vicryl 0 in a running stitch from side-to-side to reduce the cystocele.  We then cut the excess vaginal mucosa. We closed the vaginal mucosa with a running suture of Vicryl 0. Posteriorly, we performed a perineoplasty. The skin at the perineum was opened in a triangle are fashion with the scalpel. We then opened the vaginal mucosa posteriorly over on 3 cm. The levator ani muscles were attached on the midline with 2 figure-of-eight stitches of Vicryl 0.  We then closed back the vaginal  mucosa with a running suture of Vicryl 2-0. We closed the skin of the perineum with a Vicryl 4-0. Hemostasis was adequate at all levels. The patient was brought to recovery room in good and stable status.  ESTIMATED BLOOD LOSS: 100 cc   Intake/Output Summary (Last 24 hours) at 07/17/12 1629 Last data filed at 07/17/12 1625  Gross per 24 hour  Intake   2000 ml  Output    600 ml  Net   1400 ml     BLOOD ADMINISTERED:none   LOCAL MEDICATIONS USED:  MARCAINE     SPECIMEN:  Source of Specimen:  Uterus with tubes and cervix.    DISPOSITION OF SPECIMEN:  PATHOLOGY  COUNTS:  YES  PLAN OF CARE: Transfer to PACU  Genia Del MD  07/17/2012 at 4:30 pm

## 2012-07-17 NOTE — Anesthesia Postprocedure Evaluation (Signed)
  Anesthesia Post Note  Patient: Amanda Eaton  Procedure(s) Performed: Procedure(s) (LRB): ROBOTIC ASSISTED TOTAL HYSTERECTOMY with Utero-sacral Ligament Suspension (N/A) ANTERIOR (CYSTOCELE) AND POSTERIOR REPAIR (RECTOCELE) (N/A)  Anesthesia type: GA  Patient location: PACU  Post pain: Pain level controlled  Post assessment: Post-op Vital signs reviewed  Last Vitals:  Filed Vitals:   07/17/12 1715  BP: 119/65  Pulse: 60  Temp:   Resp: 16    Post vital signs: Reviewed  Level of consciousness: sedated  Complications: No apparent anesthesia complications

## 2012-07-17 NOTE — Transfer of Care (Signed)
Immediate Anesthesia Transfer of Care Note  Patient: Amanda Eaton  Procedure(s) Performed: Procedure(s): ROBOTIC ASSISTED TOTAL HYSTERECTOMY with Utero-sacral Ligament Suspension (N/A) ANTERIOR (CYSTOCELE) AND POSTERIOR REPAIR (RECTOCELE) (N/A)  Patient Location: PACU  Anesthesia Type:General  Level of Consciousness: awake, alert  and oriented  Airway & Oxygen Therapy: Patient Spontanous Breathing and Patient connected to nasal cannula oxygen  Post-op Assessment: Report given to PACU RN and Post -op Vital signs reviewed and stable  Post vital signs: Reviewed and stable  Complications: No apparent anesthesia complications

## 2012-07-17 NOTE — Anesthesia Preprocedure Evaluation (Addendum)
Anesthesia Evaluation  Patient identified by MRN, date of birth, ID band Patient awake    Reviewed: Allergy & Precautions, H&P , NPO status , Patient's Chart, lab work & pertinent test results, reviewed documented beta blocker date and time   History of Anesthesia Complications Negative for: history of anesthetic complications  Airway Mallampati: II TM Distance: >3 FB Neck ROM: full    Dental  (+) Teeth Intact   Pulmonary neg pulmonary ROS,  breath sounds clear to auscultation  Pulmonary exam normal       Cardiovascular Exercise Tolerance: Good hypertension, On Home Beta Blockers Rhythm:regular Rate:Normal     Neuro/Psych negative neurological ROS  negative psych ROS   GI/Hepatic Neg liver ROS, Bowel prep,  Endo/Other  diabetes, Type 2, Oral Hypoglycemic Agents  Renal/GU   Female GU complaint     Musculoskeletal   Abdominal   Peds  Hematology negative hematology ROS (+)   Anesthesia Other Findings   Reproductive/Obstetrics negative OB ROS                          Anesthesia Physical Anesthesia Plan  ASA: III  Anesthesia Plan: General ETT   Post-op Pain Management:    Induction:   Airway Management Planned:   Additional Equipment:   Intra-op Plan:   Post-operative Plan:   Informed Consent: I have reviewed the patients History and Physical, chart, labs and discussed the procedure including the risks, benefits and alternatives for the proposed anesthesia with the patient or authorized representative who has indicated his/her understanding and acceptance.   Dental Advisory Given  Plan Discussed with: CRNA and Surgeon  Anesthesia Plan Comments:         Anesthesia Quick Evaluation

## 2012-07-17 NOTE — H&P (Signed)
Amanda Eaton is an 57 y.o. female  G3P3  RP:  Uterine prolapse grade 2-3/3 and Cystorectocele for [facility] da Vinci with Utero-Sacral ligament suspension and possible anterior/posterior repair.  Pertinent Gynecological History: Menses: post-menopausal Contraception: Menopausal Blood transfusions: none Sexually transmitted diseases: None Previous GYN Procedures: HSC myomectomy, endometrial ablation Last mammogram: normal Last pap: normal   Menstrual History:  No LMP recorded. Patient is postmenopausal.    Past Medical History  Diagnosis Date  . DIABETES MELLITUS, TYPE II 11/23/2006  . HYPERLIPIDEMIA, BORDERLINE 04/08/2008  . HYPERTENSION 11/23/2006  . WEIGHT GAIN 10/12/2008    Past Surgical History  Procedure Laterality Date  . Hysteroscopy w/ endometrial ablation      1987  . Myomectomy vaginal approach      Family History  Problem Relation Age of Onset  . Diabetes Mother   . Hyperlipidemia Mother   . Hypertension Mother   . COPD Father     Social History:  reports that she has never smoked. She does not have any smokeless tobacco history on file. She reports that she does not drink alcohol or use illicit drugs.  Allergies: No Known Allergies  Prescriptions prior to admission  Medication Sig Dispense Refill  . bisoprolol-hydrochlorothiazide (ZIAC) 2.5-6.25 MG per tablet Take 1 tablet by mouth daily.  90 tablet  3  . SitaGLIPtin-MetFORMIN HCl [phone] MG TB24 Take 1 tablet by mouth daily.  90 tablet  3     Blood pressure 110/61, pulse 56, temperature 97.3 F (36.3 C), temperature source Oral, resp. rate 18, SpO2 100.00%.   Results for orders placed during the hospital encounter of 07/17/12 (from the past 24 hour(s))  GLUCOSE, CAPILLARY     Status: None   Collection Time    07/17/12 11:44 AM      Result Value Range   Glucose-Capillary 91  70 - 99 mg/dL    No results found.  Assessment/Plan:  TLH with Uterosacral ligament suspension da Vinci and possible  anterior/posterior repair.  Surgery and risks reviewed.   Meliss Fleek,MARIE-LYNE 07/17/2012, 12:11 PM

## 2012-07-18 ENCOUNTER — Encounter (HOSPITAL_COMMUNITY): Payer: Self-pay | Admitting: Obstetrics & Gynecology

## 2012-07-18 DIAGNOSIS — Z90710 Acquired absence of both cervix and uterus: Secondary | ICD-10-CM | POA: Diagnosis not present

## 2012-07-18 LAB — CBC
HCT: 38.2 % (ref 36.0–46.0)
Hemoglobin: 12.3 g/dL (ref 12.0–15.0)
MCV: 87.8 fL (ref 78.0–100.0)
RBC: 4.35 MIL/uL (ref 3.87–5.11)
WBC: 10.4 10*3/uL (ref 4.0–10.5)

## 2012-07-18 MED ORDER — OXYCODONE-ACETAMINOPHEN 7.5-325 MG PO TABS: 1.0000 | ORAL_TABLET | ORAL | Status: DC | PRN

## 2012-07-18 NOTE — Discharge Summary (Signed)
  Physician Discharge Summary  Patient ID: Amanda Eaton MRN: [id number] DOB/AGE: 1955/09/19 57 y.o.  Admit date: 07/17/2012 Discharge date: 07/18/2012  Admission Diagnoses: Symptomatic Uterine Prolapse, Cystorectocele  58570, 57260, [phone]  Discharge Diagnoses: Symptomatic Uterine Prolapse, Cystorectocele  58570, 57260, [phone]        Active Problems:   S/P hysterectomy   Discharged Condition: good  Hospital Course: Good  Consults: None  Treatments: surgery: Total laparoscopic hysterectomy with Utero-Sacral ligament suspension with da Vinci, Cystocele repair and perineoplasty.  Disposition: Final discharge disposition not confirmed     Medication List    TAKE these medications       bisoprolol-hydrochlorothiazide 2.5-6.25 MG per tablet  Commonly known as:  ZIAC  Take 1 tablet by mouth daily.     oxyCODONE-acetaminophen 7.5-325 MG per tablet  Commonly known as:  PERCOCET  Take 1 tablet by mouth every 4 (four) hours as needed for pain.     SitaGLIPtin-MetFORMIN HCl [phone] MG Tb24  Take 1 tablet by mouth daily.           Follow-up Information   Follow up with Boubacar Lerette,MARIE-LYNE, MD In 3 weeks.   Contact information:   [address] [place] [address] [phone]       Signed: Genia Del, MD 07/18/2012, 12:58 PM

## 2012-07-18 NOTE — Anesthesia Postprocedure Evaluation (Signed)
  Anesthesia Post-op Note  Patient: Amanda Eaton  Procedure(s) Performed: Procedure(s): ROBOTIC ASSISTED TOTAL HYSTERECTOMY with Utero-sacral Ligament Suspension (N/A) ANTERIOR (CYSTOCELE) AND POSTERIOR REPAIR (RECTOCELE) (N/A)  Patient Location: Women's Unit  Anesthesia Type:General  Level of Consciousness: awake, oriented and patient cooperative  Airway and Oxygen Therapy: Patient Spontanous Breathing  Post-op Pain: none  Post-op Assessment: Patient's Cardiovascular Status Stable, Respiratory Function Stable, Patent Airway and Pain level controlled  Post-op Vital Signs: Reviewed and stable  Complications: No apparent anesthesia complications

## 2012-07-18 NOTE — Progress Notes (Signed)
1 Day Post-Op Procedure(s) (LRB): ROBOTIC ASSISTED TOTAL HYSTERECTOMY with Utero-sacral Ligament Suspension (N/A) ANTERIOR (CYSTOCELE) AND POSTERIOR REPAIR (RECTOCELE) (N/A)  Subjective: Patient reports that pain is well managed.  Tolerating normal diet as tolerated  diet without difficulty. No nausea / vomiting.  Ambulating and voiding.  Objective: BP 105/60  Pulse 80  Temp(Src) 98.1 F (36.7 C) (Oral)  Resp 16  Ht 5\' 6"  (1.676 m)  Wt 83.915 kg (185 lb)  BMI 29.87 kg/m2  SpO2 98% Lungs: clear Heart: normal rate and rhythm Abdomen:soft and appropriately tender Extremities: Homans sign is negative, no sign of DVT Incision: healing well  Post op Hb 12.3  Assessment: s/p Procedure(s): ROBOTIC ASSISTED TOTAL HYSTERECTOMY with Utero-sacral Ligament Suspension ANTERIOR (CYSTOCELE) AND POSTERIOR REPAIR (RECTOCELE): stable and progressing well  Plan: Discharge home  LOS: 1 day    Amanda Eaton,Amanda Eaton 07/18/2012, 12:47 PM

## 2012-07-18 NOTE — Progress Notes (Signed)
Patient discharged home.  Patient verbalized understanding of discharge instructions.  Patient ambulated to car without difficulty.

## 2012-07-18 NOTE — Progress Notes (Signed)
07/18/12 1300  Clinical Encounter Type  Visited With Patient and family together  Visit Type Social support;Spiritual support  Spiritual Encounters  Spiritual Needs Emotional   Family was delighted with quality of care and staff here at [facility]  Provided encouragement and celebration as Amanda Eaton headed home filled with relief and gratitude.  [address] [place], [place] [phone]

## 2012-11-12 ENCOUNTER — Other Ambulatory Visit: Payer: Self-pay | Admitting: Internal Medicine

## 2013-01-30 ENCOUNTER — Other Ambulatory Visit (HOSPITAL_COMMUNITY): Payer: Self-pay | Admitting: Obstetrics & Gynecology

## 2013-01-30 DIAGNOSIS — Z1231 Encounter for screening mammogram for malignant neoplasm of breast: Secondary | ICD-10-CM

## 2013-03-03 ENCOUNTER — Ambulatory Visit (INDEPENDENT_AMBULATORY_CARE_PROVIDER_SITE_OTHER): Payer: Managed Care, Other (non HMO) | Admitting: Family

## 2013-03-03 ENCOUNTER — Encounter: Payer: Self-pay | Admitting: Family

## 2013-03-03 VITALS — BP 138/86 | HR 67 | Temp 97.7°F | Wt 185.0 lb

## 2013-03-03 DIAGNOSIS — Z0000 Encounter for general adult medical examination without abnormal findings: Secondary | ICD-10-CM

## 2013-03-03 DIAGNOSIS — E119 Type 2 diabetes mellitus without complications: Secondary | ICD-10-CM

## 2013-03-03 DIAGNOSIS — E7800 Pure hypercholesterolemia, unspecified: Secondary | ICD-10-CM

## 2013-03-03 DIAGNOSIS — I10 Essential (primary) hypertension: Secondary | ICD-10-CM

## 2013-03-03 DIAGNOSIS — Z23 Encounter for immunization: Secondary | ICD-10-CM

## 2013-03-03 LAB — BASIC METABOLIC PANEL
BUN: 19 mg/dL (ref 6–23)
Chloride: 103 mEq/L (ref 96–112)
Glucose, Bld: 94 mg/dL (ref 70–99)
Potassium: 4.5 mEq/L (ref 3.5–5.1)
Sodium: 140 mEq/L (ref 135–145)

## 2013-03-03 LAB — LIPID PANEL
Cholesterol: 196 mg/dL (ref 0–200)
LDL Cholesterol: 122 mg/dL — ABNORMAL HIGH (ref 0–99)

## 2013-03-03 LAB — POCT URINALYSIS DIPSTICK
Glucose, UA: NEGATIVE
Protein, UA: NEGATIVE
Spec Grav, UA: 1.02
Urobilinogen, UA: 0.2

## 2013-03-03 LAB — HEPATIC FUNCTION PANEL
ALT: 18 U/L (ref 0–35)
AST: 17 U/L (ref 0–37)
Albumin: 3.9 g/dL (ref 3.5–5.2)
Alkaline Phosphatase: 50 U/L (ref 39–117)
Total Bilirubin: 0.7 mg/dL (ref 0.3–1.2)

## 2013-03-03 NOTE — Patient Instructions (Signed)
Exercise Exercise and a healthy diet may help you lose weight. Your doctor may suggest specific exercises. EXERCISE IDEAS AND TIPS  Choose low-cost things you enjoy doing, such as walking, bicycling, or exercising to workout videos.  Take stairs instead of the elevator.  Walk during your lunch break.  Park your car further away from work or school.  Go to a gym or an exercise class.  Start with 5 to 10 minutes of exercise each day. Build up to 30 minutes of exercise 4 to 6 days a week.  Wear shoes with good support and comfortable clothes.  Stretch before and after working out.  Work out until you breathe harder and your heart beats faster.  Drink extra water when you exercise.  Do not do so much that you hurt yourself, feel dizzy, or get very short of breath. Exercises that burn about 150 calories:  Running 1  miles in 15 minutes.  Playing volleyball for 45 to 60 minutes.  Washing and waxing a car for 45 to 60 minutes.  Playing touch football for 45 minutes.  Walking 1  miles in 35 minutes.  Pushing a stroller 1  miles in 30 minutes.  Playing basketball for 30 minutes.  Raking leaves for 30 minutes.  Bicycling 5 miles in 30 minutes.  Walking 2 miles in 30 minutes.  Dancing for 30 minutes.  Shoveling snow for 15 minutes.  Swimming laps for 20 minutes.  Walking up stairs for 15 minutes.  Bicycling 4 miles in 15 minutes.  Gardening for 30 to 45 minutes.  Jumping rope for 15 minutes.  Washing windows or floors for 45 to 60 minutes. Document Released: 05/13/2010 Document Revised: 07/03/2011 Document Reviewed: 05/13/2010 [facility] Patient Information 2014 [place], [place].

## 2013-03-03 NOTE — Progress Notes (Signed)
Subjective:    Patient ID: Amanda Eaton, female    DOB: 10/13/1955, 57 y.o.   MRN: [id number]  HPI 57 year old white female, patient of Dr. Lovell Sheehan is in today for a routine physical examination. She has a history of Type 2 DM, Hypertension, Hyperlipidemia. Reviewed all health maintenance protocols including mammography colonoscopy bone density and reviewed appropriate screening labs. Her immunization history was reviewed as well as her current medications and allergies refills of her chronic medications were given and the plan for yearly health maintenance was discussed all orders and referrals were made as appropriate.   Review of Systems  Constitutional: Negative.   HENT: Negative.   Eyes: Negative.   Respiratory: Negative.   Cardiovascular: Negative.   Gastrointestinal: Negative.   Endocrine: Negative.   Genitourinary: Negative.   Musculoskeletal: Negative.   Skin: Negative.   Allergic/Immunologic: Negative.   Neurological: Negative.   Hematological: Negative.   Psychiatric/Behavioral: Negative.    Past Medical History  Diagnosis Date  . DIABETES MELLITUS, TYPE II 11/23/2006  . HYPERLIPIDEMIA, BORDERLINE 04/08/2008  . HYPERTENSION 11/23/2006  . WEIGHT GAIN 10/12/2008    History   Social History  . Marital Status: Married    Spouse Name: N/A    Number of Children: N/A  . Years of Education: N/A   Occupational History  . Not on file.   Social History Main Topics  . Smoking status: Never Smoker   . Smokeless tobacco: Not on file  . Alcohol Use: No  . Drug Use: No  . Sexual Activity: Yes   Other Topics Concern  . Not on file   Social History Narrative  . No narrative on file    Past Surgical History  Procedure Laterality Date  . Hysteroscopy w/ endometrial ablation      1987  . Myomectomy vaginal approach    . Robotic assisted total hysterectomy N/A 07/17/2012    Procedure: ROBOTIC ASSISTED TOTAL HYSTERECTOMY with Utero-sacral Ligament Suspension;  Surgeon:  Genia Del, MD;  Location: [facility];  Service: Gynecology;  Laterality: N/A;  . Anterior and posterior repair N/A 07/17/2012    Procedure: ANTERIOR (CYSTOCELE) AND POSTERIOR REPAIR (RECTOCELE);  Surgeon: Genia Del, MD;  Location: [facility];  Service: Gynecology;  Laterality: N/A;    Family History  Problem Relation Age of Onset  . Diabetes Mother   . Hyperlipidemia Mother   . Hypertension Mother   . COPD Father     No Known Allergies  Current Outpatient Prescriptions on File Prior to Visit  Medication Sig Dispense Refill  . bisoprolol-hydrochlorothiazide (ZIAC) 2.5-6.25 MG per tablet TAKE 1 TABLET DAILY  90 tablet  3  . JANUMET XR [phone] MG TB24 TAKE 1 TABLET DAILY  90 tablet  3   No current facility-administered medications on file prior to visit.    BP 138/86  Pulse 67  Temp(Src) 97.7 F (36.5 C) (Oral)  Wt 185 lb (83.915 kg)  SpO2 97%chart    Objective:   Physical Exam  Constitutional: She is oriented to person, place, and time. She appears well-developed and well-nourished.  HENT:  Head: Normocephalic.  Right Ear: External ear normal.  Left Ear: External ear normal.  Nose: Nose normal.  Mouth/Throat: Oropharynx is clear and moist.  Eyes: Conjunctivae and EOM are normal. Pupils are equal, round, and reactive to light.  Neck: Normal range of motion. Neck supple. No thyromegaly present.  Cardiovascular: Normal rate, regular rhythm and normal heart sounds.   Pulmonary/Chest: Effort normal and breath sounds  normal.  Abdominal: Soft. Bowel sounds are normal.  Musculoskeletal: Normal range of motion. She exhibits no edema and no tenderness.  Neurological: She is alert and oriented to person, place, and time. She has normal reflexes. No cranial nerve deficit. Coordination normal.  Skin: Skin is warm and dry.  Psychiatric: She has a normal mood and affect.          Assessment & Plan:  Assessment:  1. CPX 2. Type 2 DM 3. Hypertension 4.  Hyperlipidemia  Plan: Refer for colonoscopy. Mammogram as scheduled. Continue current meds. Labs sent will notify patient pending results. Encouraged a healthy diet, exercise, monthly self breast exams. Follow up in 6 months, pending labs, and sooner as needed.

## 2013-03-03 NOTE — Progress Notes (Signed)
Pre visit review using our clinic review tool, if applicable. No additional management support is needed unless otherwise documented below in the visit note. 

## 2013-03-07 ENCOUNTER — Ambulatory Visit (HOSPITAL_COMMUNITY): Payer: Managed Care, Other (non HMO)

## 2013-04-03 ENCOUNTER — Ambulatory Visit (HOSPITAL_COMMUNITY)
Admission: RE | Admit: 2013-04-03 | Discharge: 2013-04-03 | Disposition: A | Discharge status: Home / Self Care | Payer: Managed Care, Other (non HMO) | Source: Ambulatory Visit | Attending: Obstetrics & Gynecology | Admitting: Obstetrics & Gynecology

## 2013-04-03 DIAGNOSIS — Z1231 Encounter for screening mammogram for malignant neoplasm of breast: Secondary | ICD-10-CM | POA: Insufficient documentation

## 2013-04-04 ENCOUNTER — Encounter: Payer: Self-pay | Admitting: Family

## 2013-04-24 HISTORY — PX: CATARACT EXTRACTION: SUR2

## 2013-10-30 ENCOUNTER — Other Ambulatory Visit: Payer: Self-pay | Admitting: Internal Medicine

## 2014-03-02 ENCOUNTER — Ambulatory Visit (INDEPENDENT_AMBULATORY_CARE_PROVIDER_SITE_OTHER): Payer: Managed Care, Other (non HMO) | Admitting: Family

## 2014-03-02 ENCOUNTER — Ambulatory Visit (INDEPENDENT_AMBULATORY_CARE_PROVIDER_SITE_OTHER): Payer: Managed Care, Other (non HMO)

## 2014-03-02 ENCOUNTER — Encounter: Payer: Self-pay | Admitting: Family

## 2014-03-02 VITALS — BP 100/80 | HR 64 | Ht 66.0 in | Wt 181.1 lb

## 2014-03-02 DIAGNOSIS — Z1211 Encounter for screening for malignant neoplasm of colon: Secondary | ICD-10-CM

## 2014-03-02 DIAGNOSIS — Z23 Encounter for immunization: Secondary | ICD-10-CM

## 2014-03-02 DIAGNOSIS — E119 Type 2 diabetes mellitus without complications: Secondary | ICD-10-CM

## 2014-03-02 DIAGNOSIS — E7800 Pure hypercholesterolemia, unspecified: Secondary | ICD-10-CM

## 2014-03-02 LAB — BASIC METABOLIC PANEL
BUN: 18 mg/dL (ref 6–23)
CO2: 28 mEq/L (ref 19–32)
CREATININE: 0.7 mg/dL (ref 0.4–1.2)
Calcium: 9.3 mg/dL (ref 8.4–10.5)
Chloride: 103 mEq/L (ref 96–112)
GFR: 89.38 mL/min (ref >60.00)
Glucose, Bld: 95 mg/dL (ref 70–99)
Potassium: 4.3 mEq/L (ref 3.5–5.1)
Sodium: 141 mEq/L (ref 135–145)

## 2014-03-02 LAB — HEMOGLOBIN A1C: Hgb A1c MFr Bld: 6.2 % (ref 4.6–6.5)

## 2014-03-02 LAB — LIPID PANEL
CHOL/HDL RATIO: 3
Cholesterol: 191 mg/dL (ref 0–200)
HDL: 55.5 mg/dL (ref >39.00)
LDL Cholesterol: 125 mg/dL — ABNORMAL HIGH (ref 0–99)
NONHDL: 135.5
Triglycerides: 54 mg/dL (ref 0.0–149.0)
VLDL: 10.8 mg/dL (ref 0.0–40.0)

## 2014-03-02 LAB — HEPATIC FUNCTION PANEL
ALT: 13 U/L (ref 0–35)
AST: 19 U/L (ref 0–37)
Albumin: 3.4 g/dL — ABNORMAL LOW (ref 3.5–5.2)
Alkaline Phosphatase: 43 U/L (ref 39–117)
BILIRUBIN TOTAL: 0.8 mg/dL (ref 0.2–1.2)
Bilirubin, Direct: 0.1 mg/dL (ref 0.0–0.3)
Total Protein: 6.6 g/dL (ref 6.0–8.3)

## 2014-03-02 MED ORDER — LISINOPRIL-HYDROCHLOROTHIAZIDE 10-12.5 MG PO TABS: 1.0000 | ORAL_TABLET | Freq: Every day | ORAL | Status: DC

## 2014-03-02 MED ORDER — SITAGLIP PHOS-METFORMIN HCL ER 100-1000 MG PO TB24: ORAL_TABLET | ORAL | Status: DC

## 2014-03-02 NOTE — Patient Instructions (Signed)
Diabetes and Exercise Diabetes mellitus is a common, chronic disease, in which the pancreas is unable to adequately control blood glucose (sugar) levels. There are 2 types of diabetes. Type 1 diabetes patients are unable to produce insulin, a hormone that causes sugar in the blood to be stored in the body. People with type 1 diabetes may compensate by giving themselves injections of insulin. Type 2 diabetes involves not producing adequate amounts of insulin to control blood glucose levels. People with type 2 diabetes control their blood glucose by monitoring their food intake or by taking medicine. Exercise is an important part of diabetes treatment. During exercise, the muscles use a greater amount of glucose from the blood for energy. This lowers your blood glucose, which is the same effect you would get from taking insulin. It has been shown that endurance athletes are more sensitive to insulin than inactive people. SYMPTOMS  Many people with a mild case of diabetes have no symptoms. However, if left uncontrolled, diabetes can lead to several complications that could be prevented with treatment of the disease. General symptoms of diabetes include:   Frequent urination (polyuria).  Frequent thirst and drinking (polydipsia).  Increased food consumption (polyphagia).  Fatigue.  Poor exercise performance.  Blurred vision.  Inflammation of the vagina (vaginitis) caused by fungal infections.  Skin infections (uncommon).  Numbness in the feet, caused by nerve injury.  Kidney disease. CAUSES  The cause of most cases of diabetes is unknown. In children, diabetes is often due to an autoimmune response to the cells in the pancreas that make insulin. It is also linked with other diseases, such as cystic fibrosis. Diabetes may have a genetic link. PREVENTION  Athletes should strive to begin exercise with blood glucose in a well-controlled state.  Feet should always be kept clean and  dry.  Activities in which low blood sugar levels cannot be treated easily (scuba diving, rock climbing, swimming) should be avoided.  Anticipate alterations in diet or training to avoid low blood sugar (hypoglycemia) and high blood sugar (hyperglycemia).  Athletes should try to increase sugar consumption after strenuous exercise to avoid hypoglycemia.  Short-acting insulin should not be injected into an actively exercising muscle. The athlete should rest the injection site for about 1 hour after exercise.  Patients with diabetes should get routine checkups of the feet to prevent complications. PROGNOSIS  Exercise provides many benefits to the person with diabetes:   Reduced body fat.  Lower blood pressure.  Often, reduced need for medicines.  Improved exercise tolerance.  Lower insulin levels.  Weight loss.  Improved lipid profile (decreased cholesterol and low-density lipoproteins). RELATED COMPLICATIONS  If performed incorrectly, exercise can result in complications of diabetes:   Poor control of blood sugar, when exercise is performed at the wrong time.  Increase in renal disease, from loss of body fluids (dehydration).  Increased risk of nerve injury (neuropathy) when performing exercises that increase foot injury.  Increased risk of eye problems when performing activities that involve breath holding or lowering or jarring the head.  Increased risk of sudden death from exercise in patients with heart disease.  Worsening of hypertension with heavy lifting (more than 10 lb/4.5 kg). Altered blood glucose and insulin dose as a result of mild illness that produces loss of appetite.  Altered uptake of insulin after injection when insulin injection site is changed. NOTE: Exercise can lower blood glucose effectively, but the effects are short-lasting (no more than a couple of days). Exercise has been shown to   improve your sensitivity to insulin. This may alter how your body  responds to a given dose of injected insulin. It is important for every patient with diabetes to know how his or her body may react to exercise, and to adjust insulin dosages accordingly. TREATMENT  Eat about 1 to 3 hours before exercise.  Check blood glucose immediately before and after exercise.  Stop exercise if blood glucose is more than 250 mg/dL.  Stop exercise if blood glucose is less than 100 mg/dL.  Do not exercise within 1 hour of an insulin injection.  Be prepared to treat low blood glucose while exercising. Keep some sugar product with you, such as a candy bar.  For prolonged exercise, use a sports drink to maintain your glucose level.  Replace used-up glucose in the body after exercise.  Consume fluids during and after exercise to avoid dehydration. SEEK MEDICAL CARE IF:  You have vision changes after a run.  You notice a loss of sensation in your feet after exercise.  You have increased numbness, tingling, or pins and needles sensations after exercise.  You have chest pain during or after exercise.  You have a fast, irregular heartbeat (palpitations) during or after exercise.  Your exercise tolerance gets worse.  You have fainting or dizzy spells for brief periods during or after exercise. Document Released: 04/10/2005 Document Revised: 08/25/2013 Document Reviewed: 07/23/2008 ExitCare Patient Information 2015 [employer]. This information is not intended to replace advice given to you by your health care provider. Make sure you discuss any questions you have with your health care provider.  

## 2014-03-02 NOTE — Progress Notes (Signed)
Subjective:    Patient ID: Amanda Eaton, female    DOB: 08/23/1955, 58 y.o.   MRN: [id number]  HPI 58 year old white female, nonsmoker with a history of type 2 diabetes, hypertension, hyperlipidemia is in today for recheck. Reports that she's been doing well. Last hemoglobin A1c was 6.2. His schedule A mammogram done. Is also due for colonoscopy. Does not routinely exercise. Follow healthy diet. Requesting appointment vaccine.   Review of Systems  Constitutional: Negative.   HENT: Negative.   Eyes: Negative.   Respiratory: Negative.   Cardiovascular: Negative.   Gastrointestinal: Negative.   Endocrine: Negative.   Genitourinary: Negative.   Musculoskeletal: Negative.   Skin: Negative.   Neurological: Negative.   Hematological: Negative.   Psychiatric/Behavioral: Negative.    Past Medical History  Diagnosis Date  . DIABETES MELLITUS, TYPE II 11/23/2006  . [place], [place] 04/08/2008  . HYPERTENSION 11/23/2006  . WEIGHT GAIN 10/12/2008    History   Social History  . Marital Status: Married    Spouse Name: N/A    Number of Children: N/A  . Years of Education: N/A   Occupational History  . Not on file.   Social History Main Topics  . Smoking status: Never Smoker   . Smokeless tobacco: Not on file  . Alcohol Use: No  . Drug Use: No  . Sexual Activity: Yes   Other Topics Concern  . Not on file   Social History Narrative    Past Surgical History  Procedure Laterality Date  . Hysteroscopy w/ endometrial ablation      1987  . Myomectomy vaginal approach    . Robotic assisted total hysterectomy N/A 07/17/2012    Procedure: ROBOTIC ASSISTED TOTAL HYSTERECTOMY with Utero-sacral Ligament Suspension;  Surgeon: Princess Bruins, MD;  Location: [facility];  Service: Gynecology;  Laterality: N/A;  . Anterior and posterior repair N/A 07/17/2012    Procedure: ANTERIOR (CYSTOCELE) AND POSTERIOR REPAIR (RECTOCELE);  Surgeon: Princess Bruins, MD;  Location: [facility];   Service: Gynecology;  Laterality: N/A;    Family History  Problem Relation Age of Onset  . Diabetes Mother   . Hyperlipidemia Mother   . Hypertension Mother   . COPD Father     No Known Allergies  No current outpatient prescriptions on file prior to visit.   No current facility-administered medications on file prior to visit.    BP 100/80 mmHg  Pulse 64  Ht 5\' 6"  (1.676 m)  Wt 181 lb 1.6 oz (82.146 kg)  BMI 29.24 kg/m2chart     Objective:   Physical Exam  Constitutional: She is oriented to person, place, and time. She appears well-developed and well-nourished.  Neck: Normal range of motion. Neck supple.  Cardiovascular: Normal rate, regular rhythm and normal heart sounds.   Pulmonary/Chest: Effort normal and breath sounds normal.  Abdominal: Soft. Bowel sounds are normal.  Musculoskeletal: Normal range of motion.  Neurological: She is alert and oriented to person, place, and time.  Skin: Skin is warm and dry.  Psychiatric: She has a normal mood and affect.          Assessment & Plan:  Miah was seen today for follow-up and diabetes.  Diagnoses and associated orders for this visit:  Pure hypercholesterolemia - Lipid Panel - Basic Metabolic Panel - Hepatic Function Panel  Special screening for malignant neoplasms, colon - Ambulatory referral to Gastroenterology  Diabetes type 2, controlled - Hemoglobin A1c  Other Orders - lisinopril-hydrochlorothiazide (PRINZIDE,ZESTORETIC) 10-12.5 MG per tablet; Take 1 tablet  by mouth daily. - SitaGLIPtin-MetFORMIN HCl (JANUMET XR) [phone] MG TB24; TAKE 1 TABLET DAILY    Continue current medications. Labs will notify patient pending results. Recheck in 6 months and sooner as needed.

## 2014-03-02 NOTE — Progress Notes (Signed)
Pre visit review using our clinic review tool, if applicable. No additional management support is needed unless otherwise documented below in the visit note. 

## 2014-04-01 ENCOUNTER — Other Ambulatory Visit (HOSPITAL_COMMUNITY): Payer: Self-pay | Admitting: Obstetrics & Gynecology

## 2014-04-01 DIAGNOSIS — Z1231 Encounter for screening mammogram for malignant neoplasm of breast: Secondary | ICD-10-CM

## 2014-04-08 ENCOUNTER — Encounter: Payer: Managed Care, Other (non HMO) | Admitting: Family

## 2014-04-21 ENCOUNTER — Ambulatory Visit (HOSPITAL_COMMUNITY)
Admission: RE | Admit: 2014-04-21 | Discharge: 2014-04-21 | Disposition: A | Discharge status: Home / Self Care | Payer: Managed Care, Other (non HMO) | Source: Ambulatory Visit | Attending: Obstetrics & Gynecology | Admitting: Obstetrics & Gynecology

## 2014-04-21 DIAGNOSIS — Z1231 Encounter for screening mammogram for malignant neoplasm of breast: Secondary | ICD-10-CM | POA: Diagnosis not present

## 2014-04-28 ENCOUNTER — Telehealth: Payer: Self-pay

## 2014-04-28 ENCOUNTER — Other Ambulatory Visit: Payer: Self-pay

## 2014-04-28 MED ORDER — BISOPROLOL-HYDROCHLOROTHIAZIDE 2.5-6.25 MG PO TABS: 1.0000 | ORAL_TABLET | Freq: Every day | ORAL | Status: DC

## 2014-04-28 NOTE — Telephone Encounter (Signed)
Done

## 2014-04-28 NOTE — Telephone Encounter (Signed)
Rx request for Ziac 2.5/6.25.    Pharm:  [facility]  Pls advise

## 2014-05-01 ENCOUNTER — Encounter: Payer: Self-pay | Admitting: Family

## 2014-06-30 ENCOUNTER — Other Ambulatory Visit: Payer: Self-pay | Admitting: Family

## 2014-09-08 ENCOUNTER — Ambulatory Visit: Payer: Managed Care, Other (non HMO) | Admitting: Family Medicine

## 2014-10-18 ENCOUNTER — Other Ambulatory Visit: Payer: Self-pay | Admitting: Family

## 2014-10-21 ENCOUNTER — Ambulatory Visit: Payer: Managed Care, Other (non HMO) | Admitting: Adult Health

## 2014-10-28 ENCOUNTER — Other Ambulatory Visit: Payer: Self-pay | Admitting: *Deleted

## 2014-10-28 ENCOUNTER — Telehealth: Payer: Self-pay | Admitting: Family Medicine

## 2014-10-28 MED ORDER — LISINOPRIL-HYDROCHLOROTHIAZIDE 10-12.5 MG PO TABS: 1.0000 | ORAL_TABLET | Freq: Every day | ORAL | Status: DC

## 2014-10-28 NOTE — Telephone Encounter (Signed)
Prescription sent in  

## 2014-10-28 NOTE — Telephone Encounter (Signed)
Refill request for Prinzide and send to [facility].

## 2014-11-04 ENCOUNTER — Ambulatory Visit: Payer: Managed Care, Other (non HMO) | Admitting: Adult Health

## 2014-11-30 ENCOUNTER — Encounter (HOSPITAL_COMMUNITY): Payer: Self-pay

## 2014-11-30 ENCOUNTER — Emergency Department (HOSPITAL_COMMUNITY)
Admission: EM | Admit: 2014-11-30 | Discharge: 2014-11-30 | Disposition: A | Discharge status: Home / Self Care | Payer: BLUE CROSS/BLUE SHIELD | Attending: Emergency Medicine | Admitting: Emergency Medicine

## 2014-11-30 DIAGNOSIS — I10 Essential (primary) hypertension: Secondary | ICD-10-CM | POA: Diagnosis not present

## 2014-11-30 DIAGNOSIS — E119 Type 2 diabetes mellitus without complications: Secondary | ICD-10-CM | POA: Insufficient documentation

## 2014-11-30 DIAGNOSIS — Z79899 Other long term (current) drug therapy: Secondary | ICD-10-CM | POA: Insufficient documentation

## 2014-11-30 DIAGNOSIS — R55 Syncope and collapse: Secondary | ICD-10-CM | POA: Diagnosis present

## 2014-11-30 LAB — I-STAT CHEM 8, ED
BUN: 22 mg/dL — ABNORMAL HIGH (ref 6–20)
Calcium, Ion: 1.18 mmol/L (ref 1.13–1.30)
Chloride: 102 mmol/L (ref 101–111)
Creatinine, Ser: 0.8 mg/dL (ref 0.44–1.00)
Glucose, Bld: 100 mg/dL — ABNORMAL HIGH (ref 65–99)
HCT: 41 % (ref 36.0–46.0)
Hemoglobin: 13.9 g/dL (ref 12.0–15.0)
Potassium: 3.7 mmol/L (ref 3.5–5.1)
Sodium: 140 mmol/L (ref 135–145)
TCO2: 26 mmol/L (ref 0–100)

## 2014-11-30 MED ORDER — SODIUM CHLORIDE 0.9 % IV BOLUS (SEPSIS)
1000.0000 mL | Freq: Once | INTRAVENOUS | Status: AC
  Administered 2014-11-30: 1000 mL via INTRAVENOUS

## 2014-11-30 NOTE — ED Notes (Signed)
Knows to follow up with PCP and to keep close check on CBG. Ambulatory with avs in hand, neurologically intact.

## 2014-11-30 NOTE — Discharge Instructions (Signed)
Near-Syncope Near-syncope (commonly known as near fainting) is sudden weakness, dizziness, or feeling like you might pass out. During an episode of near-syncope, you may also develop pale skin, have tunnel vision, or feel sick to your stomach (nauseous). Near-syncope may occur when getting up after sitting or while standing for a long time. It is caused by a sudden decrease in blood flow to the brain. This decrease can result from various causes or triggers, most of which are not serious. However, because near-syncope can sometimes be a sign of something serious, a medical evaluation is required. The specific cause is often not determined. HOME CARE INSTRUCTIONS  Monitor your condition for any changes. The following actions may help to alleviate any discomfort you are experiencing:  Have someone stay with you until you feel stable.  Lie down right away and prop your feet up if you start feeling like you might faint. Breathe deeply and steadily. Wait until all the symptoms have passed. Most of these episodes last only a few minutes. You may feel tired for several hours.   Drink enough fluids to keep your urine clear or pale yellow.   If you are taking blood pressure or heart medicine, get up slowly when seated or lying down. Take several minutes to sit and then stand. This can reduce dizziness.  Follow up with your health care provider as directed. SEEK IMMEDIATE MEDICAL CARE IF:   You have a severe headache.   You have unusual pain in the chest, abdomen, or back.   You are bleeding from the mouth or rectum, or you have black or tarry stool.   You have an irregular or very fast heartbeat.   You have repeated fainting or have seizure-like jerking during an episode.   You faint when sitting or lying down.   You have confusion.   You have difficulty walking.   You have severe weakness.   You have vision problems.  MAKE SURE YOU:   Understand these instructions.  Will  watch your condition.  Will get help right away if you are not doing well or get worse. Document Released: 04/10/2005 Document Revised: 04/15/2013 Document Reviewed: 09/13/2012 ExitCare Patient Information 2015 [employer]. This information is not intended to replace advice given to you by your health care provider. Make sure you discuss any questions you have with your health care provider.  

## 2014-11-30 NOTE — ED Notes (Signed)
Pt presents via EMS with c/o syncopal episode and LOC. Pt was at work standing at her computer and started to feel hot and dizzy and then lost consciousness for approx 5 seconds. Co-workers lowered her to the floor, pt did not hit her head. Pt reports that right before the episode, she had a bowel movement because her stomach was hurting, pt does admit to straining during the bowel movement.

## 2014-11-30 NOTE — ED Notes (Signed)
Awake. Verbally responsive. A/O x4. Resp even and unlabored. No audible adventitious breath sounds noted. ABC's intact. SR on monitor. IV infusing NS at 975ml/hr without difficulty. Family at bedside.

## 2014-11-30 NOTE — ED Notes (Signed)
Bed: EK35 Expected date:  Expected time:  Means of arrival:  Comments: EMS 59yo F, syncope- room 25

## 2014-11-30 NOTE — ED Provider Notes (Signed)
CSN: [id number]     Arrival date & time 11/30/14  1220 History   First MD Initiated Contact with Patient 11/30/14 1340     Chief Complaint  Patient presents with  . Loss of Consciousness     (Consider location/radiation/quality/duration/timing/severity/associated sxs/prior Treatment) HPI Comments: Patient patient with past medical history of diabetes, hyperlipidemia, and hypertension presents to the emergency department with chief complaint of syncopal episode. 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 She states that she was then assisted to the ground and passed out for approximately 5 seconds. She denies head or head or injure herself. She felt lightheaded prior to passing out. Patient states that she had just previously had a BM, which was nonbloody. Patient states that she is now feeling well. She states that her blood sugar has been running in the 100s. She is compliant in taking her medications. She states that she has had this happen a couple of times in the past. Patient denies any chest pain, shortness of breath, nausea, or vomiting.  The history is provided by the patient. No language interpreter was used.    Past Medical History  Diagnosis Date  . DIABETES MELLITUS, TYPE II 11/23/2006  . [place], [place] 04/08/2008  . HYPERTENSION 11/23/2006  . WEIGHT GAIN 10/12/2008   Past Surgical History  Procedure Laterality Date  . Hysteroscopy w/ endometrial ablation      1987  . Myomectomy vaginal approach    . Robotic assisted total hysterectomy N/A 07/17/2012    Procedure: ROBOTIC ASSISTED TOTAL HYSTERECTOMY with Utero-sacral Ligament Suspension;  Surgeon: Princess Bruins, MD;  Location: [facility];  Service: Gynecology;  Laterality: N/A;  . Anterior and posterior repair N/A 07/17/2012    Procedure: ANTERIOR (CYSTOCELE) AND POSTERIOR REPAIR (RECTOCELE);  Surgeon: Princess Bruins, MD;   Location: [facility];  Service: Gynecology;  Laterality: N/A;   Family History  Problem Relation Age of Onset  . Diabetes Mother   . Hyperlipidemia Mother   . Hypertension Mother   . COPD Father    History  Substance Use Topics  . Smoking status: Never Smoker   . Smokeless tobacco: Not on file  . Alcohol Use: No   OB History    No data available     Review of Systems  Constitutional: Negative for fever and chills.  Respiratory: Negative for shortness of breath.   Cardiovascular: Negative for chest pain.  Gastrointestinal: Negative for nausea, vomiting, diarrhea and constipation.  Genitourinary: Negative for dysuria.  Neurological: Positive for syncope.      Allergies  Review of patient's allergies indicates no known allergies.  Home Medications   Prior to Admission medications   Medication Sig Start Date End Date Taking? Authorizing Provider  ibuprofen (ADVIL,MOTRIN) 200 MG tablet Take 400 mg by mouth every 6 (six) hours as needed for moderate pain.   Yes Historical Provider, MD  JANUMET XR [phone] MG TB24 TAKE 1 TABLET DAILY 10/19/14  Yes Kennyth Arnold, FNP  lisinopril-hydrochlorothiazide (PRINZIDE,ZESTORETIC) 10-12.5 MG per tablet Take 1 tablet by mouth daily. Patient taking differently: Take 0.5 tablets by mouth daily.  10/28/14  Yes Kennyth Arnold, FNP  bisoprolol-hydrochlorothiazide [facility]) 2.5-6.25 MG per tablet TAKE 1 TABLET DAILY Patient not taking: Reported on 11/30/2014 06/30/14   Kennyth Arnold, FNP   BP 121/81 mmHg  Pulse 63  Temp(Src) 97.4 F (36.3 C) (Oral)  Resp 14  SpO2 99%  Physical Exam  Constitutional: She is oriented to person, place, and time. She appears well-developed and well-nourished.  HENT:  Head: Normocephalic and atraumatic.  Eyes: Conjunctivae and EOM are normal. Pupils are equal, round, and reactive to light.  Neck: Normal range of motion. Neck supple.  Cardiovascular: Normal rate and regular rhythm.  Exam reveals no gallop and no friction  rub.   No murmur heard. Pulmonary/Chest: Effort normal and breath sounds normal. No respiratory distress. She has no wheezes. She has no rales. She exhibits no tenderness.  Abdominal: Soft. Bowel sounds are normal. She exhibits no distension and no mass. There is no tenderness. There is no rebound and no guarding.  Musculoskeletal: Normal range of motion. She exhibits no edema or tenderness.  Neurological: She is alert and oriented to person, place, and time.  Skin: Skin is warm and dry.  Psychiatric: She has a normal mood and affect. Her behavior is normal. Judgment and thought content normal.  Nursing note and vitals reviewed.   ED Course  Procedures (including critical care time) Results for orders placed or performed during the hospital encounter of 11/30/14  I-stat chem 8, ed  Result Value Ref Range   Sodium 140 135 - 145 mmol/L   Potassium 3.7 3.5 - 5.1 mmol/L   Chloride 102 101 - 111 mmol/L   BUN 22 (H) 6 - 20 mg/dL   Creatinine, Ser 0.80 0.44 - 1.00 mg/dL   Glucose, Bld 100 (H) 65 - 99 mg/dL   Calcium, Ion 1.18 1.13 - 1.30 mmol/L   TCO2 26 0 - 100 mmol/L   Hemoglobin 13.9 12.0 - 15.0 g/dL   HCT 41.0 36.0 - 46.0 %   No results found.    EKG Interpretation None      MDM   Final diagnoses:  Syncope, unspecified syncope type    Well-appearing 59 year old female who had a brief syncopal episode earlier today while at work. She is feeling well now. Does not feel dizzy now. She never had any chest pain or shortness of breath. The syncopal episode was preceded by feeling lightheaded. Will give some fluids, check EKG, orthostatic vital signs, and Chem-8. Anticipate discharge to home. Patient is very well-appearing.  3:41 PM Patient reassessed, she is still feeling very well. Labs, orthostatic vital signs, and EKG are reassuring.  DC to home.    Montine Circle, PA-C 11/30/14 [place], MD 12/01/14 [phone]

## 2014-11-30 NOTE — ED Notes (Signed)
Pt reported having syncope episode after having BM. Denies dizziness/lightheadedness, nausea, headache, or visual disturbances at this time.

## 2014-12-14 ENCOUNTER — Encounter: Payer: Self-pay | Admitting: Adult Health

## 2014-12-14 ENCOUNTER — Ambulatory Visit (INDEPENDENT_AMBULATORY_CARE_PROVIDER_SITE_OTHER): Payer: BLUE CROSS/BLUE SHIELD | Admitting: Adult Health

## 2014-12-14 VITALS — BP 102/80 | Temp 98.3°F | Ht 66.0 in | Wt 182.2 lb

## 2014-12-14 DIAGNOSIS — Z7189 Other specified counseling: Secondary | ICD-10-CM

## 2014-12-14 DIAGNOSIS — E669 Obesity, unspecified: Secondary | ICD-10-CM

## 2014-12-14 DIAGNOSIS — E119 Type 2 diabetes mellitus without complications: Secondary | ICD-10-CM

## 2014-12-14 DIAGNOSIS — I10 Essential (primary) hypertension: Secondary | ICD-10-CM

## 2014-12-14 DIAGNOSIS — Z23 Encounter for immunization: Secondary | ICD-10-CM | POA: Diagnosis not present

## 2014-12-14 DIAGNOSIS — E1169 Type 2 diabetes mellitus with other specified complication: Secondary | ICD-10-CM

## 2014-12-14 DIAGNOSIS — Z7689 Persons encountering health services in other specified circumstances: Secondary | ICD-10-CM

## 2014-12-14 MED ORDER — LISINOPRIL 5 MG PO TABS
5.0000 mg | ORAL_TABLET | Freq: Every day | ORAL | Status: DC
Start: 2014-12-14 — End: 2015-05-10

## 2014-12-14 NOTE — Progress Notes (Signed)
HPI:  Amanda Eaton is here to establish care. She is a very pleasant 59 year old female with  has a past medical history of DIABETES MELLITUS, TYPE II (11/23/2006); HYPERLIPIDEMIA, BORDERLINE (04/08/2008); HYPERTENSION (11/23/2006); and WEIGHT GAIN (10/12/2008).  Last PCP and physical: March 2015 with GYN  Has the following chronic problems that require follow up and concerns today:   Diabetes - She is on Janumet XR 100-1000mg  and her A1c's have been well controlled. She has started testing her blood sugars at home and they have been in the low 100's.   She did go to the hospital 2 weeks ago for " passing out, while at work." she was told that she was dehydrated. Lab work and EKG were reassuring. No further syncopal episodes  ROS negative for unless reported above: fevers, chills,feeling poorly, unintentional weight loss, hearing or vision loss, chest pain, palpitations, leg claudication, struggling to breath,Not feeling congested in the chest, no orthopenia, no cough,no wheezing, normal appetite, no soft tissue swelling, no hemoptysis, melena, hematochezia, hematuria, falls, loc, si, or thoughts of self harm.  Immunizations:UTD Diet:Diabetic Diet.  Exercise:She plays with her grandchildren  Colonoscopy:Has never had one . Pap Smear: Sees Gynocology Mammogram:03/2014  Past Medical History  Diagnosis Date  . DIABETES MELLITUS, TYPE II 11/23/2006  . [place], [place] 04/08/2008  . HYPERTENSION 11/23/2006  . WEIGHT GAIN 10/12/2008    Past Surgical History  Procedure Laterality Date  . Hysteroscopy w/ endometrial ablation      1987  . Myomectomy vaginal approach    . Robotic assisted total hysterectomy N/A 07/17/2012    Procedure: ROBOTIC ASSISTED TOTAL HYSTERECTOMY with Utero-sacral Ligament Suspension;  Surgeon: Princess Bruins, MD;  Location: [facility];  Service: Gynecology;  Laterality: N/A;  . Anterior and posterior repair N/A 07/17/2012    Procedure: ANTERIOR (CYSTOCELE) AND  POSTERIOR REPAIR (RECTOCELE);  Surgeon: Princess Bruins, MD;  Location: [facility];  Service: Gynecology;  Laterality: N/A;    Family History  Problem Relation Age of Onset  . Diabetes Mother   . Hyperlipidemia Mother   . Hypertension Mother   . COPD Father     Social History   Social History  . Marital Status: Married    Spouse Name: N/A  . Number of Children: N/A  . Years of Education: N/A   Social History Main Topics  . Smoking status: Never Smoker   . Smokeless tobacco: None  . Alcohol Use: No  . Drug Use: No  . Sexual Activity: Yes   Other Topics Concern  . None   Social History Narrative     Current outpatient prescriptions:  .  ibuprofen (ADVIL,MOTRIN) 200 MG tablet, Take 400 mg by mouth every 6 (six) hours as needed for moderate pain., Disp: , Rfl:  .  JANUMET XR [phone] MG TB24, TAKE 1 TABLET DAILY, Disp: 90 tablet, Rfl: 0 .  lisinopril-hydrochlorothiazide (PRINZIDE,ZESTORETIC) 10-12.5 MG per tablet, Take 1 tablet by mouth daily. (Patient taking differently: Take 0.5 tablets by mouth daily. ), Disp: 90 tablet, Rfl: 0  EXAM:  Filed Vitals:   12/14/14 1402  BP: 102/80  Temp: 98.3 F (36.8 C)    Body mass index is 29.42 kg/(m^2).  GENERAL: vitals reviewed and listed above, alert, oriented, appears well hydrated and in no acute distress  HEENT: atraumatic, conjunttiva clear, no obvious abnormalities on inspection of external nose and ears  NECK: Neck is soft and supple without masses, no adenopathy or thyromegaly, trachea midline, no JVD. Normal range of motion.  LUNGS: clear to auscultation bilaterally, no wheezes, rales or rhonchi, good air movement  CV: Regular rate and rhythm, normal S1/S2, no audible murmurs, gallops, or rubs. No carotid bruit and no peripheral edema.   MS: moves all extremities without noticeable abnormality. No edema noted  Abd: soft/nontender/nondistended/normal bowel sounds   Skin: warm and dry, no rash   Extremities:  No clubbing, cyanosis, or edema. Capillary refill is WNL. Pulses intact bilaterally in upper and lower extremities.   Neuro: CN II-XII intact, sensation and reflexes normal throughout, 5/5 muscle strength in bilateral upper and lower extremities. Normal finger to nose. Normal rapid alternating movements. Normal romberg. No pronator drift.   PSYCH: pleasant and cooperative, no obvious depression or anxiety  ASSESSMENT AND PLAN: 1. Encounter to establish care - Ambulatory referral to Gastroenterology - Follow up in 2 months for CPE - Follow up sooner if needed  2. Diabetes mellitus type 2 in obese - Continue with current medications- no change - Work on diabetic diet and exercise  3. Essential hypertension - d/c HCTZ-Lisinopril - lisinopril (PRINIVIL,ZESTRIL) 5 MG tablet; Take 1 tablet (5 mg total) by mouth daily.  Dispense: 30 tablet; Refill: 3 - If BP goes above 120 then restart HCTZ-Lisinopril  4. Need for influenza vaccination - Flu vaccine greater than or equal to 3yo preservative free IM  5. Need for Streptococcus pneumoniae vaccination - Pneumococcal conjugate vaccine 13-valent IM   -We reviewed the PMH, PSH, FH, SH, Meds and Allergies. -We provided refills for any medications we will prescribe as needed. -We addressed current concerns per orders and patient instructions. -We have asked for records for pertinent exams, studies, vaccines and notes from previous providers. -We have advised patient to follow up per instructions below.   -Patient advised to return or notify a provider immediately if symptoms worsen or persist or new concerns arise.  There are no Patient Instructions on file for this visit.   Dorothyann Peng, AGNP

## 2014-12-14 NOTE — Progress Notes (Signed)
Pre visit review using our clinic review tool, if applicable. No additional management support is needed unless otherwise documented below in the visit note. 

## 2014-12-14 NOTE — Patient Instructions (Signed)
It was great meeting you today!  I have sent in a prescription for Lisinopril 5mg . Stop taking your blood pressure medication that you are taking now and start taking the lisinopril. If you notice your blood pressure above 120, then restart the original medication.    Follow up in 2 months for your physical.   Continue to work on diet and exerise.

## 2014-12-16 ENCOUNTER — Other Ambulatory Visit: Payer: Self-pay | Admitting: *Deleted

## 2014-12-16 MED ORDER — LISINOPRIL-HYDROCHLOROTHIAZIDE 10-12.5 MG PO TABS: 1.0000 | ORAL_TABLET | Freq: Every day | ORAL | Status: DC

## 2015-02-15 ENCOUNTER — Telehealth: Payer: Self-pay | Admitting: Adult Health

## 2015-02-15 NOTE — Telephone Encounter (Signed)
Pt is on vacation week on 03-01-15 and needs cpx. Can  I create 30 min slot. Pt was last seen in aug and was instructed to come back in 2 month for a cpx

## 2015-02-15 NOTE — Telephone Encounter (Signed)
Can we get her in for an appointment in December?

## 2015-02-16 NOTE — Telephone Encounter (Signed)
Left message on vm for pt to call back.  

## 2015-02-17 NOTE — Telephone Encounter (Signed)
S/w pt she will check her schedule for December and call back to schedule

## 2015-03-30 ENCOUNTER — Other Ambulatory Visit: Payer: Self-pay | Admitting: Family

## 2015-04-29 ENCOUNTER — Other Ambulatory Visit: Payer: Self-pay

## 2015-04-29 DIAGNOSIS — Z1231 Encounter for screening mammogram for malignant neoplasm of breast: Secondary | ICD-10-CM

## 2015-05-07 ENCOUNTER — Other Ambulatory Visit (INDEPENDENT_AMBULATORY_CARE_PROVIDER_SITE_OTHER): Payer: BLUE CROSS/BLUE SHIELD

## 2015-05-07 DIAGNOSIS — Z0000 Encounter for general adult medical examination without abnormal findings: Secondary | ICD-10-CM | POA: Diagnosis not present

## 2015-05-07 LAB — CBC WITH DIFFERENTIAL/PLATELET
Basophils Absolute: 0 10*3/uL (ref 0.0–0.1)
Basophils Relative: 0.4 % (ref 0.0–3.0)
EOS PCT: 2.2 % (ref 0.0–5.0)
Eosinophils Absolute: 0.1 10*3/uL (ref 0.0–0.7)
HCT: 39 % (ref 36.0–46.0)
HEMOGLOBIN: 12.8 g/dL (ref 12.0–15.0)
Lymphocytes Relative: 31.3 % (ref 12.0–46.0)
Lymphs Abs: 1.3 10*3/uL (ref 0.7–4.0)
MCHC: 32.7 g/dL (ref 30.0–36.0)
MCV: 87.4 fl (ref 78.0–100.0)
MONOS PCT: 8.1 % (ref 3.0–12.0)
Monocytes Absolute: 0.3 10*3/uL (ref 0.1–1.0)
Neutro Abs: 2.4 10*3/uL (ref 1.4–7.7)
Neutrophils Relative %: 58 % (ref 43.0–77.0)
Platelets: 183 10*3/uL (ref 150.0–400.0)
RBC: 4.47 Mil/uL (ref 3.87–5.11)
RDW: 13.5 % (ref 11.5–15.5)
WBC: 4.2 10*3/uL (ref 4.0–10.5)

## 2015-05-07 LAB — BASIC METABOLIC PANEL
BUN: 27 mg/dL — ABNORMAL HIGH (ref 6–23)
CO2: 30 mEq/L (ref 19–32)
Calcium: 9.3 mg/dL (ref 8.4–10.5)
Chloride: 104 mEq/L (ref 96–112)
Creatinine, Ser: 0.8 mg/dL (ref 0.40–1.20)
GFR: 77.57 mL/min (ref >60.00)
GLUCOSE: 117 mg/dL — AB (ref 70–99)
POTASSIUM: 4.6 meq/L (ref 3.5–5.1)
Sodium: 141 mEq/L (ref 135–145)

## 2015-05-07 LAB — LIPID PANEL
Cholesterol: 192 mg/dL (ref 0–200)
HDL: 63.1 mg/dL (ref >39.00)
LDL Cholesterol: 115 mg/dL — ABNORMAL HIGH (ref 0–99)
NONHDL: 128.9
Total CHOL/HDL Ratio: 3
Triglycerides: 69 mg/dL (ref 0.0–149.0)
VLDL: 13.8 mg/dL (ref 0.0–40.0)

## 2015-05-07 LAB — POCT URINALYSIS DIPSTICK
Bilirubin, UA: NEGATIVE
Glucose, UA: NEGATIVE
KETONES UA: NEGATIVE
NITRITE UA: NEGATIVE
PH UA: 5.5
PROTEIN UA: NEGATIVE
Spec Grav, UA: 1.025
Urobilinogen, UA: 0.2

## 2015-05-07 LAB — MICROALBUMIN / CREATININE URINE RATIO
Creatinine,U: 143.3 mg/dL
MICROALB/CREAT RATIO: 1.3 mg/g (ref 0.0–30.0)
Microalb, Ur: 1.8 mg/dL (ref 0.0–1.9)

## 2015-05-07 LAB — HEPATIC FUNCTION PANEL
ALBUMIN: 3.9 g/dL (ref 3.5–5.2)
ALT: 16 U/L (ref 0–35)
AST: 18 U/L (ref 0–37)
Alkaline Phosphatase: 48 U/L (ref 39–117)
Bilirubin, Direct: 0.1 mg/dL (ref 0.0–0.3)
Total Bilirubin: 0.4 mg/dL (ref 0.2–1.2)
Total Protein: 6.5 g/dL (ref 6.0–8.3)

## 2015-05-07 LAB — TSH: TSH: 1.01 u[IU]/mL (ref 0.35–4.50)

## 2015-05-07 LAB — HEMOGLOBIN A1C: Hgb A1c MFr Bld: 6.2 % (ref 4.6–6.5)

## 2015-05-10 ENCOUNTER — Ambulatory Visit (INDEPENDENT_AMBULATORY_CARE_PROVIDER_SITE_OTHER): Payer: BLUE CROSS/BLUE SHIELD | Admitting: Adult Health

## 2015-05-10 ENCOUNTER — Encounter: Payer: Self-pay | Admitting: Gastroenterology

## 2015-05-10 ENCOUNTER — Encounter: Payer: Self-pay | Admitting: Adult Health

## 2015-05-10 VITALS — BP 114/72 | Temp 98.5°F | Ht 65.5 in | Wt 185.3 lb

## 2015-05-10 DIAGNOSIS — R319 Hematuria, unspecified: Secondary | ICD-10-CM

## 2015-05-10 DIAGNOSIS — E669 Obesity, unspecified: Secondary | ICD-10-CM

## 2015-05-10 DIAGNOSIS — I10 Essential (primary) hypertension: Secondary | ICD-10-CM

## 2015-05-10 DIAGNOSIS — Z0000 Encounter for general adult medical examination without abnormal findings: Secondary | ICD-10-CM

## 2015-05-10 DIAGNOSIS — Z1211 Encounter for screening for malignant neoplasm of colon: Secondary | ICD-10-CM

## 2015-05-10 DIAGNOSIS — E119 Type 2 diabetes mellitus without complications: Secondary | ICD-10-CM

## 2015-05-10 DIAGNOSIS — E1169 Type 2 diabetes mellitus with other specified complication: Secondary | ICD-10-CM

## 2015-05-10 LAB — POCT URINALYSIS DIPSTICK
Bilirubin, UA: NEGATIVE
Glucose, UA: NEGATIVE
Ketones, UA: NEGATIVE
Nitrite, UA: NEGATIVE
PROTEIN UA: NEGATIVE
SPEC GRAV UA: 1.02
UROBILINOGEN UA: 0.2
pH, UA: 7

## 2015-05-10 MED ORDER — LISINOPRIL-HYDROCHLOROTHIAZIDE 10-12.5 MG PO TABS: 1.0000 | ORAL_TABLET | Freq: Every day | ORAL | Status: DC

## 2015-05-10 MED ORDER — SITAGLIP PHOS-METFORMIN HCL ER 100-1000 MG PO TB24: 1.0000 | ORAL_TABLET | Freq: Every day | ORAL | Status: DC

## 2015-05-10 NOTE — Progress Notes (Signed)
Pre visit review using our clinic review tool, if applicable. No additional management support is needed unless otherwise documented below in the visit note. 

## 2015-05-10 NOTE — Patient Instructions (Addendum)
It was great seeing you again!  Someone will call you to schedule your colonoscopy.   I will follow up with you regarding your urinalysis and if needed we will get you over to urology just to make sure everything is ok.   Your new prescriptions have been sent to the pharmacy.   Follow up with me in one year or sooner if needed.

## 2015-05-10 NOTE — Progress Notes (Signed)
Subjective:    Patient ID: Amanda Eaton, female    DOB: [birth year], 60 y.o.   MRN: [id number]  HPI  Patient presents for yearly preventative medicine examination.  All immunizations and health maintenance protocols were reviewed with the patient and needed orders were placed.  Medication reconciliation,  past medical history, social history, problem list and allergies were reviewed in detail with the patient   Goals were established with regard to weight loss, exercise, and diet in compliance with medications. She is trying to eat healthy. Does not exercise. Has gained approx 4 pounds since August.   End of life planning was discussed. She does not have advanced directives or a living will.   She has no acute complaints during this visit.   Review of Systems  Constitutional: Negative.   HENT: Negative.   Eyes: Negative.   Cardiovascular: Negative.   Gastrointestinal: Negative.   Endocrine: Negative.   Genitourinary: Negative.   Musculoskeletal: Negative.   Skin: Negative.   Allergic/Immunologic: Negative.   Neurological: Negative.   Hematological: Negative.   Psychiatric/Behavioral: Negative.   All other systems reviewed and are negative.  Past Medical History  Diagnosis Date  . DIABETES MELLITUS, TYPE II 11/23/2006  . [place], [place] 04/08/2008  . HYPERTENSION 11/23/2006  . WEIGHT GAIN 10/12/2008    Social History   Social History  . Marital Status: Married    Spouse Name: N/A  . Number of Children: N/A  . Years of Education: N/A   Occupational History  . Not on file.   Social History Main Topics  . Smoking status: Never Smoker   . Smokeless tobacco: Not on file  . Alcohol Use: No  . Drug Use: No  . Sexual Activity: Yes   Other Topics Concern  . 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 All live locally     Past Surgical History    Procedure Laterality Date  . Hysteroscopy w/ endometrial ablation      1987  . Myomectomy vaginal approach    . Robotic assisted total hysterectomy N/A 07/17/2012    Procedure: ROBOTIC ASSISTED TOTAL HYSTERECTOMY with Utero-sacral Ligament Suspension;  Surgeon: Princess Bruins, MD;  Location: [facility];  Service: Gynecology;  Laterality: N/A;  . Anterior and posterior repair N/A 07/17/2012    Procedure: ANTERIOR (CYSTOCELE) AND POSTERIOR REPAIR (RECTOCELE);  Surgeon: Princess Bruins, MD;  Location: [facility];  Service: Gynecology;  Laterality: N/A;  . Cataract extraction  2015    Family History  Problem Relation Age of Onset  . Diabetes Mother   . Hyperlipidemia Mother   . Hypertension Mother   . COPD Father   . Lung cancer Brother     No Known Allergies  Current Outpatient Prescriptions on File Prior to Visit  Medication Sig Dispense Refill  . ibuprofen (ADVIL,MOTRIN) 200 MG tablet Take 400 mg by mouth every 6 (six) hours as needed for moderate pain. Reported on 05/10/2015    . lisinopril (PRINIVIL,ZESTRIL) 5 MG tablet Take 1 tablet (5 mg total) by mouth daily. (Patient not taking: Reported on 05/10/2015) 30 tablet 3   No current facility-administered medications on file prior to visit.    BP 114/72 mmHg  Temp(Src) 98.5 F (36.9 C) (Oral)  Ht 5' 5.5" (1.664 m)  Wt 185 lb 4.8 oz (84.052 kg)  BMI 30.36 kg/m2  Objective:   Physical Exam  Constitutional: She appears well-developed and well-nourished. No distress.  HENT:  Head: Normocephalic and atraumatic.  Right Ear: External ear normal.  Left Ear: External ear normal.  Nose: Nose normal.  Mouth/Throat: Oropharynx is clear and moist. No oropharyngeal exudate.  TM's visualized. No cerumen impaction  Eyes: Conjunctivae and EOM are normal. Pupils are equal, round, and reactive to light. Right eye exhibits no discharge. Left eye exhibits no discharge. No scleral icterus.  Neck: Normal range of motion. Neck supple. No JVD  present. No tracheal deviation present. No thyromegaly present.  Cardiovascular: Normal rate, regular rhythm, normal heart sounds and intact distal pulses.  Exam reveals no gallop and no friction rub.   No murmur heard. Pulmonary/Chest: Effort normal and breath sounds normal. No stridor. No respiratory distress. She has no wheezes. She has no rales. She exhibits no tenderness.  Abdominal: Soft. Bowel sounds are normal. She exhibits no distension and no mass. There is no tenderness. There is no rebound and no guarding.  Obese around abdomen  Genitourinary:  Refused due to going to GYN in Feb  Musculoskeletal: Normal range of motion. She exhibits no edema or tenderness.  Lymphadenopathy:    She has no cervical adenopathy.  Neurological: She is alert. She has normal reflexes. She displays normal reflexes. No cranial nerve deficit. She exhibits normal muscle tone. Coordination normal.  Skin: Skin is warm and dry. No rash noted. She is not diaphoretic. No erythema. No pallor.  Psychiatric: She has a normal mood and affect. Her behavior is normal. Judgment and thought content normal.  Nursing note and vitals reviewed.      Assessment & Plan:  1. Colon cancer screening - Ambulatory referral to Gastroenterology  2. Routine general medical examination at a health care facility - Reviewed labs with patient. Will get repeat UA due to hematuria.  - Stressed the importance of diet and exercise.  3. Essential hypertension - lisinopril-hydrochlorothiazide (PRINZIDE,ZESTORETIC) 10-12.5 MG tablet; Take 1 tablet by mouth daily.  Dispense: 90 tablet; Refill: 3 - Monitor BP at home 4. Diabetes mellitus type 2 in obese (HCC) - A1c is 6.2, this is her baseline. Continue to monitor at home - SitaGLIPtin-MetFORMIN HCl (JANUMET XR) [phone] MG TB24; Take 1 tablet by mouth daily.  Dispense: 90 tablet; Refill: 3  5. Hematuria - POCT urinalysis dipstick - Consider referral to urology.

## 2015-05-11 NOTE — Addendum Note (Signed)
Addended by: Ailene Rud E on: 05/11/2015 10:36 AM   Modules accepted: Orders

## 2015-05-14 ENCOUNTER — Telehealth: Payer: Self-pay

## 2015-05-14 NOTE — Telephone Encounter (Signed)
Advised pt of her lab results earlier.  Pt states she is concerned about her labs.  Pt has an upcoming appt for her colonoscopy.  Pt would like to know if she should be seen sooner.

## 2015-05-14 NOTE — Telephone Encounter (Signed)
Pls advise if pt should have concerns about blood in the urine and waiting until March for her colonoscopy.

## 2015-05-15 NOTE — Telephone Encounter (Signed)
She is ok to wait for her colonoscopy in march. I wouldn't be concerned at this point about the blood in her urine, it is a small amount. If she notices more blood in her urine, please let me know.

## 2015-05-17 NOTE — Telephone Encounter (Signed)
Called and spoke with pt and pt is aware.  Pt verbalized understanding and states that she has not noticed any more blood in her urine.

## 2015-05-24 ENCOUNTER — Ambulatory Visit: Payer: BLUE CROSS/BLUE SHIELD

## 2015-06-14 ENCOUNTER — Ambulatory Visit (AMBULATORY_SURGERY_CENTER): Payer: Self-pay

## 2015-06-14 VITALS — Ht 66.0 in | Wt 187.0 lb

## 2015-06-14 DIAGNOSIS — Z1211 Encounter for screening for malignant neoplasm of colon: Secondary | ICD-10-CM

## 2015-06-14 MED ORDER — NA SULFATE-K SULFATE-MG SULF 17.5-3.13-1.6 GM/177ML PO SOLN: 1.0000 | Freq: Once | ORAL | Status: DC

## 2015-06-14 NOTE — Progress Notes (Signed)
No egg or soy allergies Not on home 02 No previous anesthesia complications No diet or weight loss meds 

## 2015-06-28 ENCOUNTER — Encounter: Payer: Self-pay | Admitting: Gastroenterology

## 2015-06-28 ENCOUNTER — Ambulatory Visit (AMBULATORY_SURGERY_CENTER): Payer: BLUE CROSS/BLUE SHIELD | Admitting: Gastroenterology

## 2015-06-28 VITALS — BP 113/73 | HR 67 | Temp 98.4°F | Resp 11 | Ht 66.0 in | Wt 187.0 lb

## 2015-06-28 DIAGNOSIS — Z1211 Encounter for screening for malignant neoplasm of colon: Secondary | ICD-10-CM | POA: Diagnosis not present

## 2015-06-28 MED ORDER — SODIUM CHLORIDE 0.9 % IV SOLN: 500.0000 mL | INTRAVENOUS | Status: DC

## 2015-06-28 NOTE — Op Note (Signed)
[facility]  [address] [place], [id number]   COLONOSCOPY PROCEDURE REPORT  PATIENT: Amanda Eaton, Amanda Eaton  MR#: [id number] BIRTHDATE: 03-19-1956 , 59  yrs. old GENDER: female ENDOSCOPIST: Wilfrid Lund, MD REFERRED [id number] [place], NP PROCEDURE DATE:  06/28/2015 PROCEDURE:   Colonoscopy, screening First Screening Colonoscopy - Avg.  risk and is 50 yrs.  old or older Yes.  Prior Negative Screening - Now for repeat screening. N/A  History of Adenoma - Now for follow-up colonoscopy & has been > or = to 3 yrs.  N/A  Polyps removed today? No Recommend repeat exam, <10 yrs? No ASA CLASS:   Class II INDICATIONS:Screening for colonic neoplasia and Colorectal Neoplasm Risk Assessment for this procedure is average risk. MEDICATIONS: Monitored anesthesia care and Propofol 240 mg IV  DESCRIPTION OF PROCEDURE:   After the risks benefits and alternatives of the procedure were thoroughly explained, informed consent was obtained.  The digital rectal exam revealed no abnormalities of the rectum.   The LB [id number]  endoscope was introduced through the anus and advanced to the cecum, which was identified by both the appendix and ileocecal valve. No adverse events experienced.   The quality of the prep was excellent. (Suprep was used)  The instrument was then slowly withdrawn as the colon was fully examined. Estimated blood loss is zero unless otherwise noted in this procedure report.      COLON FINDINGS: There was mild diverticulosis noted in the sigmoid colon and ascending colon.   Small Grade I hemorrhoids were found. Retroflexed views revealed internal Grade I hemorrhoids. The time to cecum = 5.1 Withdrawal time = 8.4   The scope was withdrawn and the procedure completed. COMPLICATIONS: There were no immediate complications.  ENDOSCOPIC IMPRESSION: 1.   There was mild diverticulosis noted in the sigmoid colon and ascending colon 2.   Small Grade I  hemorrhoids  RECOMMENDATIONS: Recall for screening colonoscopy in 10 years.  eSigned:  Wilfrid Lund, MD 06/28/2015 8:17 AM   cc:

## 2015-06-28 NOTE — Progress Notes (Signed)
To recovery, report to [place], RN, VSS. 

## 2015-06-28 NOTE — Patient Instructions (Signed)
YOU HAD AN ENDOSCOPIC PROCEDURE TODAY AT [facility]:   Refer to the procedure report that was given to you for any specific questions about what was found during the examination.  If the procedure report does not answer your questions, please call your gastroenterologist to clarify.  If you requested that your care partner not be given the details of your procedure findings, then the procedure report has been included in a sealed envelope for you to review at your convenience later.  YOU SHOULD EXPECT: Some feelings of bloating in the abdomen. Passage of more gas than usual.  Walking can help get rid of the air that was put into your GI tract during the procedure and reduce the bloating. If you had a lower endoscopy (such as a colonoscopy or flexible sigmoidoscopy) you may notice spotting of blood in your stool or on the toilet paper. If you underwent a bowel prep for your procedure, you may not have a normal bowel movement for a few days.  Please Note:  You might notice some irritation and congestion in your nose or some drainage.  This is from the oxygen used during your procedure.  There is no need for concern and it should clear up in a day or so.  SYMPTOMS TO REPORT IMMEDIATELY:   Following lower endoscopy (colonoscopy or flexible sigmoidoscopy):  Excessive amounts of blood in the stool  Significant tenderness or worsening of abdominal pains  Swelling of the abdomen that is new, acute  Fever of 100F or higher   For urgent or emergent issues, a gastroenterologist can be reached at any hour by calling [phone].   DIET: Your first meal following the procedure should be a small meal and then it is ok to progress to your normal diet. Heavy or fried foods are harder to digest and may make you feel nauseous or bloated.  Likewise, meals heavy in dairy and vegetables can increase bloating.  Drink plenty of fluids but you should avoid alcoholic beverages for 24  hours.  ACTIVITY:  You should plan to take it easy for the rest of today and you should NOT DRIVE or use heavy machinery until tomorrow (because of the sedation medicines used during the test).    FOLLOW UP: Our staff will call the number listed on your records the next business day following your procedure to check on you and address any questions or concerns that you may have regarding the information given to you following your procedure. If we do not reach you, we will leave a message.  However, if you are feeling well and you are not experiencing any problems, there is no need to return our call.  We will assume that you have returned to your regular daily activities without incident.  If any biopsies were taken you will be contacted by phone or by letter within the next 1-3 weeks.  Please call us at [phone] if you have not heard about the biopsies in 3 weeks.    SIGNATURES/CONFIDENTIALITY: You and/or your care partner have signed paperwork which will be entered into your electronic medical record.  These signatures attest to the fact that that the information above on your After Visit Summary has been reviewed and is understood.  Full responsibility of the confidentiality of this discharge information lies with you and/or your care-partner.  Hemorrhoids-handout given  Repeat colonoscopy in 10 years 2027.

## 2015-06-29 ENCOUNTER — Telehealth: Payer: Self-pay | Admitting: *Deleted

## 2015-06-29 NOTE — Telephone Encounter (Signed)
  Follow up Call-  Call back number 06/28/2015  Post procedure Call Back phone  # [phone]  Permission to leave phone message Yes     Patient questions:  Do you have a fever, pain , or abdominal swelling? No. Pain Score  0 *  Have you tolerated food without any problems? Yes.    Have you been able to return to your normal activities? Yes.    Do you have any questions about your discharge instructions: Diet   No. Medications  No. Follow up visit  No.  Do you have questions or concerns about your Care? No.  Actions: * If pain score is 4 or above: No action needed, pain <4.

## 2015-07-03 ENCOUNTER — Other Ambulatory Visit: Payer: Self-pay | Admitting: Adult Health

## 2015-08-05 ENCOUNTER — Ambulatory Visit (HOSPITAL_COMMUNITY)
Admission: RE | Admit: 2015-08-05 | Discharge: 2015-08-05 | Disposition: A | Discharge status: Home / Self Care | Payer: BLUE CROSS/BLUE SHIELD | Source: Ambulatory Visit | Attending: Family Medicine | Admitting: Family Medicine

## 2015-08-05 ENCOUNTER — Telehealth: Payer: Self-pay | Admitting: Adult Health

## 2015-08-05 ENCOUNTER — Encounter: Payer: Self-pay | Admitting: Family Medicine

## 2015-08-05 ENCOUNTER — Ambulatory Visit (INDEPENDENT_AMBULATORY_CARE_PROVIDER_SITE_OTHER): Payer: BLUE CROSS/BLUE SHIELD | Admitting: Family Medicine

## 2015-08-05 VITALS — BP 120/80 | HR 72 | Temp 98.3°F | Wt 185.6 lb

## 2015-08-05 DIAGNOSIS — R609 Edema, unspecified: Secondary | ICD-10-CM

## 2015-08-05 DIAGNOSIS — I10 Essential (primary) hypertension: Secondary | ICD-10-CM | POA: Diagnosis not present

## 2015-08-05 DIAGNOSIS — M25561 Pain in right knee: Secondary | ICD-10-CM | POA: Diagnosis not present

## 2015-08-05 DIAGNOSIS — M79661 Pain in right lower leg: Secondary | ICD-10-CM | POA: Diagnosis not present

## 2015-08-05 DIAGNOSIS — E119 Type 2 diabetes mellitus without complications: Secondary | ICD-10-CM | POA: Diagnosis not present

## 2015-08-05 DIAGNOSIS — E785 Hyperlipidemia, unspecified: Secondary | ICD-10-CM | POA: Insufficient documentation

## 2015-08-05 DIAGNOSIS — M7989 Other specified soft tissue disorders: Secondary | ICD-10-CM | POA: Diagnosis not present

## 2015-08-05 DIAGNOSIS — M25473 Effusion, unspecified ankle: Secondary | ICD-10-CM

## 2015-08-05 NOTE — Progress Notes (Signed)
Patient ID: Amanda Eaton, female    DOB: [birth year], 60 y.o.   MRN: [id number]  HPI  Amanda Eaton is a 60 year old female who presents today with swollen right knee that started 6 days ago after playing on the floor with her granddaughter. Associated symptom of right knee and calf pain noted with swelling  in her right ankle Pain is a 7-8 with walking that feels heavy and noted as tender. No pain noted with sitting. Denies warmth to extremity and states that she felt better but noticed that discomfort reoccurs after standing for periods of time. Treatment at home includes ibuprofen 400mg  twice a day at the most that has provided moderate benefit.  Pain is not present sitting on the exam table but is noticed with walking or standing.   Pertinent history of right knee pain with kneeling and patient states that she has not been able to kneel without pain in the past which has been present for "quite some time"  She denies recent travel, prolonged sitting and family history of DVT.  She has a history of left lower leg fracture as a child.  Review of Systems  Constitutional: Negative for chills, fatigue and unexpected weight change.  Respiratory: Negative for cough, chest tightness, shortness of breath and wheezing.   Cardiovascular: Positive for leg swelling. Negative for chest pain and palpitations.  Gastrointestinal: Negative for nausea, vomiting, abdominal pain, diarrhea and constipation.  Musculoskeletal:       Right knee, calf, and ankle edema. Pain behind right knee and in right calf noted  Neurological: Negative for dizziness, numbness and headaches.  Hematological: Does not bruise/bleed easily.       Past Medical History  Diagnosis Date  . DIABETES MELLITUS, TYPE II 11/23/2006  . [place], [place] 04/08/2008  . HYPERTENSION 11/23/2006  . WEIGHT GAIN 10/12/2008     Social History   Social History  . Marital Status: Married    Spouse Name: N/A  . Number of Children: N/A    . Years of Education: N/A   Occupational History  . Not on file.   Social History Main Topics  . Smoking status: Never Smoker   . Smokeless tobacco: Never Used  . Alcohol Use: No  . Drug Use: No  . Sexual Activity: Yes   Other Topics Concern  . 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 All live locally     Past Surgical History  Procedure Laterality Date  . Hysteroscopy w/ endometrial ablation      1987  . Myomectomy vaginal approach    . Robotic assisted total hysterectomy N/A 07/17/2012    Procedure: ROBOTIC ASSISTED TOTAL HYSTERECTOMY with Utero-sacral Ligament Suspension;  Surgeon: Princess Bruins, MD;  Location: [facility];  Service: Gynecology;  Laterality: N/A;  . Anterior and posterior repair N/A 07/17/2012    Procedure: ANTERIOR (CYSTOCELE) AND POSTERIOR REPAIR (RECTOCELE);  Surgeon: Princess Bruins, MD;  Location: [facility];  Service: Gynecology;  Laterality: N/A;  . Cataract extraction  2015    Family History  Problem Relation Age of Onset  . Diabetes Mother   . Hyperlipidemia Mother   . Hypertension Mother   . COPD Father   . Lung cancer Brother   . Colon cancer Neg Hx     No Known Allergies  Current Outpatient  Prescriptions on File Prior to Visit  Medication Sig Dispense Refill  . ibuprofen (ADVIL,MOTRIN) 200 MG tablet Take 400 mg by mouth every 6 (six) hours as needed for moderate pain. Reported on 05/10/2015    . lisinopril-hydrochlorothiazide (PRINZIDE,ZESTORETIC) 10-12.5 MG tablet Take 1 tablet by mouth daily. (Patient taking differently: Take 1 tablet by mouth daily. Takes 1/2 tablet daily.) 90 tablet 3  . SitaGLIPtin-MetFORMIN HCl (JANUMET XR) [phone] MG TB24 Take 1 tablet by mouth daily. 90 tablet 3   No current facility-administered medications on file prior to visit.    BP 120/80 mmHg  Pulse 72  Temp(Src) 98.3 F (36.8 C) (Oral)  Wt 185  lb 9.6 oz (84.188 kg)    Objective:   Physical Exam  Constitutional: She is oriented to person, place, and time. She appears well-developed and well-nourished.  Cardiovascular: Normal rate, regular rhythm, normal heart sounds and intact distal pulses.   Pulmonary/Chest: Effort normal and breath sounds normal. She has no wheezes. She has no rales.  Musculoskeletal:       Right lower leg: She exhibits tenderness, swelling and edema.       Right foot: There is swelling.  Tenderness noted upon palpation behind right knee and when straightening leg. Negative Homan's sign 1 + nonpitting edema noted in right ankle  Measurements of extremities are noted below: Right calf:  43 cm  Right knee:  49 cm Left calf:  45 cm  Left Knee:  46cm   Lymphadenopathy:    She has no cervical adenopathy.  Neurological: She is alert and oriented to person, place, and time.  Skin: Skin is warm and dry.  Psychiatric: She has a normal mood and affect. Her behavior is normal.       Assessment & Plan:  1. Calf pain, right Exam and history support ultrasound of right LE to rule out DVT versus Baker's cyst or muscle strain or injury while kneeling on the floor with her granddaughter. Patient appointment today at [facility]. Results will be called to patient and plan of care will be developed.  - LE VENOUS; Future  2. Ankle edema   3. Right knee pain

## 2015-08-05 NOTE — Telephone Encounter (Signed)
Patient has an appointment 08/05/15

## 2015-08-05 NOTE — Patient Instructions (Signed)
Please go to [facility] for ultrasound of lower leg at 4:00pm. Results will be called to you. If you develop any SOB, chest pain, palpitations, or new symptoms, please seek medical care.

## 2015-08-05 NOTE — Telephone Encounter (Signed)
[facility] - Client [facility] [place] [facility] Patient Name: Amanda Eaton DOB: 08/15/1955 Initial Comment Caller states c/o leg swelling and edema, hurts to walk Nurse Assessment Nurse: Dimas Chyle, RN, Dellis Filbert Date/Time Eilene Ghazi Time): 08/05/2015 9:17:20 AM Confirm and document reason for call. If symptomatic, describe symptoms. You must click the next button to save text entered. ---Caller states c/o leg swelling and edema, hurts to walk. Pain and swelling in calf. Started last week. No fever. Has the patient traveled out of the country within the last 30 days? ---No Does the patient have any new or worsening symptoms? ---Yes Will a triage be completed? ---Yes Related visit to physician within the last 2 weeks? ---No Does the PT have any chronic conditions? (i.e. diabetes, asthma, etc.) ---Yes List chronic conditions. ---Diabetes type 2, HTN Is this a behavioral health or substance abuse call? ---No Guidelines Guideline Title Affirmed Question Affirmed Notes Leg Swelling and Edema [1] Thigh, calf, or ankle swelling AND [2] only 1 side Final Disposition User See Physician within 4 Hours (or PCP triage) Dimas Chyle, RN, Dellis Filbert Referrals REFERRED TO PCP OFFICE Disagree/Comply: Leta Baptist

## 2015-08-05 NOTE — Progress Notes (Signed)
Pre visit review using our clinic review tool, if applicable. No additional management support is needed unless otherwise documented below in the visit note. 

## 2015-08-12 ENCOUNTER — Telehealth: Payer: Self-pay | Admitting: Family Medicine

## 2015-08-12 ENCOUNTER — Telehealth: Payer: Self-pay

## 2015-08-12 NOTE — Telephone Encounter (Signed)
Spoke with pt and she is aware of her results

## 2015-08-12 NOTE — Telephone Encounter (Signed)
Voicemail was left for pt to return my call   

## 2015-08-12 NOTE — Telephone Encounter (Signed)
Negative for right leg thrombus. Suspect strain due to activity kneeling on the floor with grandchildren. If symptoms do not improve, worsen, or new symptoms develop please contact clinic for further evaluation.

## 2015-10-07 ENCOUNTER — Telehealth: Payer: Self-pay | Admitting: Adult Health

## 2015-10-07 NOTE — Telephone Encounter (Signed)
Pt states [facility] will not refill her SitaGLIPtin-MetFORMIN HCl (JANUMET XR) [phone] MG TB24  Even though it has refills. Pt called and they want dr to call  [phone] Ref no  [id number]

## 2015-10-07 NOTE — Telephone Encounter (Signed)
Pharmacy needed [place]'s license number - I gave information so Rx could be filled. Thanks.

## 2016-02-15 ENCOUNTER — Encounter: Payer: Self-pay | Admitting: Adult Health

## 2016-02-15 ENCOUNTER — Ambulatory Visit (INDEPENDENT_AMBULATORY_CARE_PROVIDER_SITE_OTHER): Payer: BLUE CROSS/BLUE SHIELD | Admitting: Adult Health

## 2016-02-15 VITALS — BP 122/72 | Temp 98.2°F | Ht 66.0 in | Wt 179.2 lb

## 2016-02-15 DIAGNOSIS — I10 Essential (primary) hypertension: Secondary | ICD-10-CM | POA: Diagnosis not present

## 2016-02-15 DIAGNOSIS — Z23 Encounter for immunization: Secondary | ICD-10-CM | POA: Diagnosis not present

## 2016-02-15 DIAGNOSIS — E785 Hyperlipidemia, unspecified: Secondary | ICD-10-CM | POA: Diagnosis not present

## 2016-02-15 DIAGNOSIS — E669 Obesity, unspecified: Secondary | ICD-10-CM | POA: Diagnosis not present

## 2016-02-15 DIAGNOSIS — E1169 Type 2 diabetes mellitus with other specified complication: Secondary | ICD-10-CM

## 2016-02-15 DIAGNOSIS — M25561 Pain in right knee: Secondary | ICD-10-CM

## 2016-02-15 LAB — LIPID PANEL
CHOL/HDL RATIO: 3
CHOLESTEROL: 206 mg/dL — AB (ref 0–200)
HDL: 67.6 mg/dL (ref >39.00)
LDL CALC: 127 mg/dL — AB (ref 0–99)
NONHDL: 138.26
Triglycerides: 58 mg/dL (ref 0.0–149.0)
VLDL: 11.6 mg/dL (ref 0.0–40.0)

## 2016-02-15 LAB — POCT GLYCOSYLATED HEMOGLOBIN (HGB A1C): HEMOGLOBIN A1C: 5.8

## 2016-02-15 MED ORDER — SITAGLIP PHOS-METFORMIN HCL ER 100-1000 MG PO TB24: 1.0000 | ORAL_TABLET | Freq: Every day | ORAL | 3 refills | Status: DC

## 2016-02-15 MED ORDER — LISINOPRIL-HYDROCHLOROTHIAZIDE 10-12.5 MG PO TABS: 1.0000 | ORAL_TABLET | Freq: Every day | ORAL | 3 refills | Status: DC

## 2016-02-15 NOTE — Progress Notes (Signed)
Subjective:    Patient ID: Amanda Eaton, female    DOB: [birth year], 60 y.o.   MRN: [id number]  HPI  60 year old female who  has a past medical history of DIABETES MELLITUS, TYPE II (11/23/2006); HYPERLIPIDEMIA, BORDERLINE (04/08/2008); HYPERTENSION (11/23/2006); and WEIGHT GAIN (10/12/2008). She presents to the office today for follow up regarding diabetes and hypertension.   Her last A1c was 6.2 on 05/07/2015. She currently takes Janumet 100-1000mg . She has been exercising a little bit more and she is eating healthier. Her blood sugars at home have been WNL  For hypertension she takes Prinzide 10-12.5 mg and is well controlled at home.   She was seen by a different provider in April with right knee tenderness and pain.[place] was negative for DVT. She reports that she continues to have tenderness to the medial side of the right knee as well as edema to the right knee. She denies any loss of ROM. She originally injured her knee while playing on the ground with her grandchildren.   She needs to have her lips checked for her health insurance at work.   Wt Readings from Last 3 Encounters:  02/15/16 179 lb 3.2 oz (81.3 kg)  08/05/15 185 lb 9.6 oz (84.2 kg)  06/28/15 187 lb (84.8 kg)      Review of Systems  Constitutional: Negative.   HENT: Negative.   Respiratory: Negative.   Cardiovascular: Negative.   Musculoskeletal: Positive for joint swelling and myalgias. Negative for gait problem.  Neurological: Negative.   All other systems reviewed and are negative.  Past Medical History:  Diagnosis Date  . DIABETES MELLITUS, TYPE II 11/23/2006  . [place], [place] 04/08/2008  . HYPERTENSION 11/23/2006  . WEIGHT GAIN 10/12/2008    Social History   Social History  . Marital status: Married    Spouse name: N/A  . Number of children: N/A  . Years of education: N/A   Occupational History  . Not on file.   Social History Main Topics  . Smoking status: Never Smoker  . Smokeless  tobacco: Never Used  . Alcohol use No  . Drug use: No  . Sexual activity: Yes   Other Topics Concern  . 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 All live locally     Past Surgical History:  Procedure Laterality Date  . ANTERIOR AND POSTERIOR REPAIR N/A 07/17/2012   Procedure: ANTERIOR (CYSTOCELE) AND POSTERIOR REPAIR (RECTOCELE);  Surgeon: Princess Bruins, MD;  Location: [facility];  Service: Gynecology;  Laterality: N/A;  . CATARACT EXTRACTION  2015  . HYSTEROSCOPY W/ ENDOMETRIAL ABLATION     1987  . MYOMECTOMY VAGINAL APPROACH    . ROBOTIC ASSISTED TOTAL HYSTERECTOMY N/A 07/17/2012   Procedure: ROBOTIC ASSISTED TOTAL HYSTERECTOMY with Utero-sacral Ligament Suspension;  Surgeon: Princess Bruins, MD;  Location: [facility];  Service: Gynecology;  Laterality: N/A;    Family History  Problem Relation Age of Onset  . Diabetes Mother   . Hyperlipidemia Mother   . Hypertension Mother   . COPD Father   . Lung cancer Brother   . Colon cancer Neg Hx     No Known Allergies  Current Outpatient Prescriptions on File Prior to Visit  Medication Sig Dispense Refill  . ibuprofen (ADVIL,MOTRIN) 200 MG tablet Take 400 mg by mouth every 6 (six) hours as needed  for moderate pain. Reported on 05/10/2015     No current facility-administered medications on file prior to visit.     BP 122/72   Temp 98.2 F (36.8 C) (Oral)   Ht 5\' 6"  (1.676 m)   Wt 179 lb 3.2 oz (81.3 kg)   BMI 28.92 kg/m       Objective:   Physical Exam  Constitutional: She is oriented to person, place, and time. She appears well-developed and well-nourished. No distress.  Cardiovascular: Normal rate, regular rhythm, normal heart sounds and intact distal pulses.  Exam reveals no gallop and no friction rub.   No murmur heard. Pulmonary/Chest: Effort normal and breath sounds normal. No respiratory distress. She has no  wheezes. She has no rales. She exhibits no tenderness.  Musculoskeletal: Normal range of motion. She exhibits edema (right knee) and tenderness (Medial aspect of right knee). She exhibits no deformity.       Right knee: She exhibits swelling. She exhibits normal range of motion, no effusion, no erythema, no bony tenderness and no MCL laxity. Tenderness found. Medial joint line and MCL tenderness noted. No LCL and no patellar tendon tenderness noted.  Neurological: She is alert and oriented to person, place, and time.  Skin: Skin is warm and dry. No rash noted. She is not diaphoretic. No erythema. No pallor.  Psychiatric: She has a normal mood and affect. Her behavior is normal. Judgment and thought content normal.  Nursing note and vitals reviewed.     Assessment & Plan:  1. Diabetes mellitus type 2 in obese ([place]) - Well controlled - Lipid panel - SitaGLIPtin-MetFORMIN HCl (JANUMET XR) [phone] MG TB24; Take 1 tablet by mouth daily.  Dispense: 90 tablet; Refill: 3 - Basic metabolic panel; Future - POC HgB A1c - Follow up in 3 months  - Will consider decreasing medication if she continues to lose weight and her blood sugars stay stable   2. Essential hypertension - Well controlled - Lipid panel - lisinopril-hydrochlorothiazide (PRINZIDE,ZESTORETIC) 10-12.5 MG tablet; Take 1 tablet by mouth daily.  Dispense: 90 tablet; Refill: 3 - Basic metabolic panel; Future  3. Hyperlipidemia, unspecified hyperlipidemia type  - Lipid panel  4. Acute pain of right knee - Ambulatory referral to Sports Medicine  Dorothyann Peng, NP

## 2016-02-15 NOTE — Patient Instructions (Signed)
It was great seeing you today!  Your A1c dropped from 6.2 to 5.8- that is GREAT! Keep up the good work.   I will follow up with you regarding your blood work   Follow up with me in 3 months

## 2016-03-08 ENCOUNTER — Telehealth: Payer: Self-pay | Admitting: Adult Health

## 2016-03-13 NOTE — Telephone Encounter (Signed)
error 

## 2016-03-15 ENCOUNTER — Ambulatory Visit: Payer: Self-pay

## 2016-03-15 ENCOUNTER — Ambulatory Visit (INDEPENDENT_AMBULATORY_CARE_PROVIDER_SITE_OTHER): Payer: BLUE CROSS/BLUE SHIELD | Admitting: Sports Medicine

## 2016-03-15 ENCOUNTER — Encounter: Payer: Self-pay | Admitting: Sports Medicine

## 2016-03-15 VITALS — BP 127/50 | Ht 66.0 in | Wt 179.0 lb

## 2016-03-15 DIAGNOSIS — M25561 Pain in right knee: Secondary | ICD-10-CM

## 2016-03-15 NOTE — Assessment & Plan Note (Signed)
Pain is most likely related to arthritic changes within the knee. She has a component of weak pelvic stabilizers as well. - Provided strengthening exercises for hip abduction as well as quad strengthening. - Advised that she continue to wear compression - Encouraged to follow-up in 4 weeks. If no improvement consider an injection and/or obtain x-rays.

## 2016-03-15 NOTE — Progress Notes (Signed)
  Amanda Eaton - 60 y.o. female MRN [id number]  Date of birth: 1955-07-12  SUBJECTIVE:  Including CC & ROS.   Amanda Eaton is a 60 year old female that is presenting with right knee pain. She reports the pain has been on the medial aspect of her right knee since March. She was playing with her grandchild at that time is unsure if she had his injury when it occurred. She reports the pain as stated the same since that time. She has had a study to rule out a DVT. She feels like the pain is worse when she lies on the affected side and it wakes her up at night. She denies having any formal physical therapy or taking any medications. She denies any locking of the knee. She feels like the knee might buckle from time to time. She works as a Secretary/administrator and the pain is worse when standing through the course of the day.  ROS: No unexpected weight loss, fever, chills, muscle pain, numbness/tingling, redness, otherwise see HPI    HISTORY: Past Medical, Surgical, Social, and Family History Reviewed & Updated per EMR.   Pertinent Historical Findings include: PMSHx -  Dm2, HLD, hysterectomy  PSHx -  No tobacco or alcohol use, works as a Secretary/administrator  FHx -  None  Medications - janumet   DATA REVIEWED: 08/05/15: LE venous: Negative for right leg thrombus   PHYSICAL EXAM:  VS: BP:(!) 127/50  HR: bpm  TEMP: ( )  RESP:   HT:5\' 6"  (167.6 cm)   WT:179 lb (81.2 kg)  BMI:29 PHYSICAL EXAM: Gen: NAD, alert, cooperative with exam, well-appearing HEENT: clear conjunctiva, EOMI CV:  no edema, capillary refill brisk,  Resp: non-labored, normal speech Skin: no rashes, normal turgor  Neuro: no gross deficits.  Psych:  alert and oriented Right knee:  Obvious effusion of the right knee. Tenderness palpation of the medial joint line. No tennis palpation over the lateral joint line. Normal strength with flexion and extension. Normal and points with valgus and varus stress testing. Quadrant patellar tendon  strength. Negative McMurray's test. Negative Thessaly test. No pain with patellar grind or compression. Neurovascularly intact. Weak hip abduction bilaterally.  Limited ultrasound: Right knee: There is a mild effusion in the suprapatellar pouch. Quadricep tendon and patellar tendon reviewed in long axis and found to be normal. The lateral meniscus was reviewed and found to be normal. The medial meniscus has some outpouching as well as narrowing of the medial compartment to suggest arthritic changes in the area. The trochlear groove has spurring of the medial compartment to suggest arthritic changes.   ASSESSMENT & PLAN:   Acute pain of right knee Pain is most likely related to arthritic changes within the knee. She has a component of weak pelvic stabilizers as well. - Provided strengthening exercises for hip abduction as well as quad strengthening. - Advised that she continue to wear compression - Encouraged to follow-up in 4 weeks. If no improvement consider an injection and/or obtain x-rays.

## 2016-07-03 LAB — HM DIABETES EYE EXAM

## 2016-07-05 ENCOUNTER — Encounter: Payer: Self-pay | Admitting: Family Medicine

## 2016-07-05 DIAGNOSIS — Z1231 Encounter for screening mammogram for malignant neoplasm of breast: Secondary | ICD-10-CM | POA: Diagnosis not present

## 2016-07-05 DIAGNOSIS — Z6829 Body mass index (BMI) 29.0-29.9, adult: Secondary | ICD-10-CM | POA: Diagnosis not present

## 2016-07-05 DIAGNOSIS — Z01419 Encounter for gynecological examination (general) (routine) without abnormal findings: Secondary | ICD-10-CM | POA: Diagnosis not present

## 2016-07-05 LAB — HM PAP SMEAR: HM Pap smear: NORMAL

## 2016-07-09 ENCOUNTER — Other Ambulatory Visit: Payer: Self-pay | Admitting: Adult Health

## 2016-07-09 DIAGNOSIS — E1169 Type 2 diabetes mellitus with other specified complication: Secondary | ICD-10-CM

## 2016-07-09 DIAGNOSIS — E669 Obesity, unspecified: Principal | ICD-10-CM

## 2017-01-29 ENCOUNTER — Telehealth: Payer: Self-pay | Admitting: Adult Health

## 2017-01-29 DIAGNOSIS — I10 Essential (primary) hypertension: Secondary | ICD-10-CM

## 2017-01-30 NOTE — Telephone Encounter (Signed)
Left a message on home and cell for a return call. 

## 2017-01-31 NOTE — Telephone Encounter (Signed)
Noted  

## 2017-01-31 NOTE — Telephone Encounter (Signed)
Pt has made an appointment for 11/13 and states she has enough med to get her through until then. Thanks!

## 2017-01-31 NOTE — Telephone Encounter (Signed)
Amanda Eaton routed conversation to You 34 minutes ago (12:29 PM)    Amanda Eaton 35 minutes ago (12:29 PM)      Pt has made an appointment for 11/13 and states she has enough med to get her through until then. Thanks!

## 2017-03-06 ENCOUNTER — Other Ambulatory Visit: Payer: Self-pay | Admitting: Family Medicine

## 2017-03-06 ENCOUNTER — Telehealth: Payer: Self-pay | Admitting: Adult Health

## 2017-03-06 ENCOUNTER — Encounter: Payer: Self-pay | Admitting: Adult Health

## 2017-03-06 ENCOUNTER — Ambulatory Visit (INDEPENDENT_AMBULATORY_CARE_PROVIDER_SITE_OTHER): Payer: BLUE CROSS/BLUE SHIELD | Admitting: Adult Health

## 2017-03-06 VITALS — BP 122/84 | Temp 98.0°F | Ht 65.0 in | Wt 182.0 lb

## 2017-03-06 DIAGNOSIS — E1169 Type 2 diabetes mellitus with other specified complication: Secondary | ICD-10-CM

## 2017-03-06 DIAGNOSIS — E782 Mixed hyperlipidemia: Secondary | ICD-10-CM

## 2017-03-06 DIAGNOSIS — Z23 Encounter for immunization: Secondary | ICD-10-CM | POA: Diagnosis not present

## 2017-03-06 DIAGNOSIS — E669 Obesity, unspecified: Secondary | ICD-10-CM | POA: Diagnosis not present

## 2017-03-06 DIAGNOSIS — Z0000 Encounter for general adult medical examination without abnormal findings: Secondary | ICD-10-CM

## 2017-03-06 DIAGNOSIS — I10 Essential (primary) hypertension: Secondary | ICD-10-CM

## 2017-03-06 LAB — LIPID PANEL
CHOL/HDL RATIO: 3
Cholesterol: 195 mg/dL (ref 0–200)
HDL: 67.3 mg/dL (ref >39.00)
LDL CALC: 116 mg/dL — AB (ref 0–99)
NonHDL: 128
TRIGLYCERIDES: 58 mg/dL (ref 0.0–149.0)
VLDL: 11.6 mg/dL (ref 0.0–40.0)

## 2017-03-06 LAB — CBC WITH DIFFERENTIAL/PLATELET
BASOS ABS: 0 10*3/uL (ref 0.0–0.1)
Basophils Relative: 0.5 % (ref 0.0–3.0)
EOS ABS: 0.1 10*3/uL (ref 0.0–0.7)
Eosinophils Relative: 2.6 % (ref 0.0–5.0)
HCT: 39 % (ref 36.0–46.0)
Hemoglobin: 12.5 g/dL (ref 12.0–15.0)
LYMPHS ABS: 1.4 10*3/uL (ref 0.7–4.0)
LYMPHS PCT: 35.9 % (ref 12.0–46.0)
MCHC: 32.1 g/dL (ref 30.0–36.0)
MCV: 89.1 fl (ref 78.0–100.0)
Monocytes Absolute: 0.3 10*3/uL (ref 0.1–1.0)
Monocytes Relative: 8.3 % (ref 3.0–12.0)
NEUTROS ABS: 2 10*3/uL (ref 1.4–7.7)
NEUTROS PCT: 52.7 % (ref 43.0–77.0)
PLATELETS: 198 10*3/uL (ref 150.0–400.0)
RBC: 4.38 Mil/uL (ref 3.87–5.11)
RDW: 13.5 % (ref 11.5–15.5)
WBC: 3.8 10*3/uL — ABNORMAL LOW (ref 4.0–10.5)

## 2017-03-06 LAB — TSH: TSH: 1.03 u[IU]/mL (ref 0.35–4.50)

## 2017-03-06 LAB — HEPATIC FUNCTION PANEL
ALK PHOS: 48 U/L (ref 39–117)
ALT: 11 U/L (ref 0–35)
AST: 14 U/L (ref 0–37)
Albumin: 3.9 g/dL (ref 3.5–5.2)
BILIRUBIN DIRECT: 0.1 mg/dL (ref 0.0–0.3)
BILIRUBIN TOTAL: 0.6 mg/dL (ref 0.2–1.2)
Total Protein: 6.2 g/dL (ref 6.0–8.3)

## 2017-03-06 LAB — BASIC METABOLIC PANEL
BUN: 16 mg/dL (ref 6–23)
CALCIUM: 9.2 mg/dL (ref 8.4–10.5)
CO2: 33 meq/L — AB (ref 19–32)
CREATININE: 0.62 mg/dL (ref 0.40–1.20)
Chloride: 104 mEq/L (ref 96–112)
GFR: 103.47 mL/min (ref >60.00)
GLUCOSE: 100 mg/dL — AB (ref 70–99)
Potassium: 4 mEq/L (ref 3.5–5.1)
SODIUM: 141 meq/L (ref 135–145)

## 2017-03-06 LAB — HEMOGLOBIN A1C: Hgb A1c MFr Bld: 6.2 % (ref 4.6–6.5)

## 2017-03-06 MED ORDER — SIMVASTATIN 5 MG PO TABS: 5.0000 mg | ORAL_TABLET | Freq: Every day | ORAL | 3 refills | Status: DC

## 2017-03-06 MED ORDER — SITAGLIP PHOS-METFORMIN HCL ER 100-1000 MG PO TB24: 1.0000 | ORAL_TABLET | Freq: Every day | ORAL | 1 refills | Status: DC

## 2017-03-06 MED ORDER — LISINOPRIL-HYDROCHLOROTHIAZIDE 10-12.5 MG PO TABS: 1.0000 | ORAL_TABLET | Freq: Every day | ORAL | 3 refills | Status: DC

## 2017-03-06 NOTE — Progress Notes (Signed)
Subjective:    Patient ID: Amanda Eaton, female    DOB: 09-11-1955, 61 y.o.   MRN: [id number]  HPI  Patient presents for yearly preventative medicine examination. He is a pleasant 61 year old female who  has a past medical history of DIABETES MELLITUS, TYPE II (11/23/2006), HYPERLIPIDEMIA, BORDERLINE (04/08/2008), HYPERTENSION (11/23/2006), and WEIGHT GAIN (10/12/2008).  She takes Janumet for history of DM. Her blood sugars have been well controlled in the past . She is monitoring her blood sugars periodically at home and have been in the 90-100's at home.  Lab Results  Component Value Date   HGBA1C 5.8 02/15/2016   She takes Lisinopril-HCTZ 10-12.5 mg for hypertension - stable   All immunizations and health maintenance protocols were reviewed with the patient and needed orders were placed. She is due for her flu shot and tdap   Appropriate screening laboratory values were ordered for the patient including screening of hyperlipidemia, renal function and hepatic function.  Medication reconciliation,  past medical history, social history, problem list and allergies were reviewed in detail with the patient  Goals were established with regard to weight loss, exercise, and  diet in compliance with medications. She is staying active with her grandchildren. She is still eating a healthy diet.   Wt Readings from Last 3 Encounters:  03/06/17 182 lb (82.6 kg)  03/15/16 179 lb (81.2 kg)  02/15/16 179 lb 3.2 oz (81.3 kg)   End of life planning was discussed.  She is up to date on her colonoscopy. She does participate in routine dental and vision screens. She is up to date on her mammograms and PAP, March 2018. I do not have these records.    Review of Systems  Constitutional: Negative.   HENT: Negative.   Eyes: Negative.   Respiratory: Negative.   Cardiovascular: Negative.   Gastrointestinal: Negative.   Genitourinary: Negative.   Musculoskeletal: Positive for arthralgias.  Skin:  Negative.   Allergic/Immunologic: Negative.   Neurological: Negative.   Hematological: Negative.   Psychiatric/Behavioral: Negative.   All other systems reviewed and are negative.  Past Medical History:  Diagnosis Date  . DIABETES MELLITUS, TYPE II 11/23/2006  . [place], [place] 04/08/2008  . HYPERTENSION 11/23/2006  . WEIGHT GAIN 10/12/2008    Social History   Socioeconomic History  . Marital status: Married    Spouse name: Not on file  . Number of children: Not on file  . Years of education: Not on file  . Highest education level: Not on file  Social Needs  . Financial resource strain: Not on file  . Food insecurity - worry: Not on file  . Food insecurity - inability: Not on file  . Transportation needs - medical: Not on file  . Transportation needs - non-medical: Not on file  Occupational History  . Not on file  Tobacco Use  . Smoking status: Never Smoker  . Smokeless tobacco: Never Used  Substance and Sexual Activity  . Alcohol use: No    Alcohol/week: 0.0 oz  . Drug use: No  . Sexual activity: Yes  Other Topics Concern  . 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 All live locally     Past Surgical History:  Procedure Laterality Date  . CATARACT EXTRACTION  2015  . HYSTEROSCOPY W/ ENDOMETRIAL ABLATION     1987  .  MYOMECTOMY VAGINAL APPROACH      Family History  Problem Relation Age of Onset  . Diabetes Mother   . Hyperlipidemia Mother   . Hypertension Mother   . COPD Father   . Lung cancer Brother   . Colon cancer Neg Hx     No Known Allergies  Current Outpatient Medications on File Prior to Visit  Medication Sig Dispense Refill  . ibuprofen (ADVIL,MOTRIN) 200 MG tablet Take 400 mg by mouth every 6 (six) hours as needed for moderate pain. Reported on 05/10/2015     No current facility-administered medications on file prior to visit.      BP 122/84 (BP Location: Left Arm)   Temp 98 F (36.7 C) (Oral)   Ht 5\' 5"  (1.651 m)   Wt 182 lb (82.6 kg)   BMI 30.29 kg/m       Objective:   Physical Exam  Constitutional: She is oriented to person, place, and time. She appears well-developed and well-nourished. No distress.  HENT:  Head: Normocephalic and atraumatic.  Right Ear: External ear normal.  Left Ear: External ear normal.  Nose: Nose normal.  Mouth/Throat: Oropharynx is clear and moist. No oropharyngeal exudate.  Eyes: Conjunctivae and EOM are normal. Pupils are equal, round, and reactive to light. Right eye exhibits no discharge. Left eye exhibits no discharge. No scleral icterus.  Neck: Trachea normal, normal range of motion and phonation normal. Neck supple. No JVD present. No tracheal tenderness present. Carotid bruit is not present. No tracheal deviation present. No thyroid mass and no thyromegaly present.  Cardiovascular: Normal rate, regular rhythm, normal heart sounds and intact distal pulses. Exam reveals no gallop and no friction rub.  No murmur heard. Pulmonary/Chest: Effort normal and breath sounds normal. No stridor. No respiratory distress. She has no wheezes. She has no rales. She exhibits no tenderness.  Abdominal: Soft. Bowel sounds are normal. She exhibits no distension and no mass. There is no tenderness. There is no rebound and no guarding.  Genitourinary:  Genitourinary Comments: Done by GYN   Musculoskeletal: Normal range of motion. She exhibits no edema, tenderness or deformity.  Lymphadenopathy:    She has no cervical adenopathy.  Neurological: She is alert and oriented to person, place, and time. She has normal reflexes. She displays normal reflexes. No cranial nerve deficit. She exhibits normal muscle tone. Coordination normal.  Skin: Skin is warm and dry. No rash noted. She is not diaphoretic. No erythema. No pallor.  Psychiatric: She has a normal mood and affect. Her behavior is normal.  Judgment and thought content normal.  Nursing note and vitals reviewed.     Assessment & Plan:  1. Routine general medical examination at a health care facility - Continue to eat healthy and exercise - She is UTD on vaccinations after today  - Follow up in one year or sooner if needed - Basic metabolic panel - CBC with Differential/Platelet - Hemoglobin A1c - Hepatic function panel - Lipid panel - TSH  2. Mixed hyperlipidemia - Diet controlled. Consider statin  - Basic metabolic panel - CBC with Differential/Platelet - Hemoglobin A1c - Hepatic function panel - Lipid panel - TSH  3. Diabetes mellitus type 2 in obese (HCC) - Well controlled. Consider medication dose change  - Basic metabolic panel - CBC with Differential/Platelet - Hemoglobin A1c - Hepatic function panel - Lipid panel - TSH - SitaGLIPtin-MetFORMIN HCl (JANUMET XR) [phone] MG TB24; Take 1 tablet daily by mouth.  Dispense: 90 tablet; Refill: 1  4. Essential hypertension - Well controlled. No change in medications  - Basic metabolic panel - CBC with Differential/Platelet - Hemoglobin A1c - Hepatic function panel - Lipid panel - TSH - lisinopril-hydrochlorothiazide (PRINZIDE,ZESTORETIC) 10-12.5 MG tablet; Take 1 tablet daily by mouth.  Dispense: 90 tablet; Refill: 3  5. Need for Tdap vaccination  - Tdap vaccine greater than or equal to 7yo IM  6. Need for influenza vaccination  - Flu Vaccine QUAD 6+ mos PF IM (Fluarix Quad PF)   Dorothyann Peng, NP

## 2017-03-06 NOTE — Patient Instructions (Signed)
It was great seeing you today   I will follow up with you regarding your labs   Follow up with me in one year or sooner if needed

## 2017-03-06 NOTE — Telephone Encounter (Signed)
Patients husband Annie Main dropped off Physician results Form that the patient forgot to bring in this morning when she seen [facility].   Call the patient for pick up at: [phone]  Disposition: Dr's folder

## 2017-03-09 NOTE — Telephone Encounter (Signed)
Pt notified to pick up at the front desk. 

## 2017-03-09 NOTE — Telephone Encounter (Signed)
Form filled out.  Waiting on signature

## 2017-07-09 DIAGNOSIS — E119 Type 2 diabetes mellitus without complications: Secondary | ICD-10-CM | POA: Diagnosis not present

## 2017-07-09 DIAGNOSIS — Z1231 Encounter for screening mammogram for malignant neoplasm of breast: Secondary | ICD-10-CM | POA: Diagnosis not present

## 2017-07-09 LAB — HM DIABETES EYE EXAM

## 2017-07-12 ENCOUNTER — Ambulatory Visit (INDEPENDENT_AMBULATORY_CARE_PROVIDER_SITE_OTHER): Payer: BLUE CROSS/BLUE SHIELD | Admitting: Obstetrics & Gynecology

## 2017-07-12 ENCOUNTER — Encounter: Payer: Self-pay | Admitting: Obstetrics & Gynecology

## 2017-07-12 VITALS — BP 132/78 | Ht 65.5 in | Wt 183.0 lb

## 2017-07-12 DIAGNOSIS — Z780 Asymptomatic menopausal state: Secondary | ICD-10-CM

## 2017-07-12 DIAGNOSIS — Z13820 Encounter for screening for osteoporosis: Secondary | ICD-10-CM | POA: Diagnosis not present

## 2017-07-12 DIAGNOSIS — Z01411 Encounter for gynecological examination (general) (routine) with abnormal findings: Secondary | ICD-10-CM

## 2017-07-12 DIAGNOSIS — Z90710 Acquired absence of both cervix and uterus: Secondary | ICD-10-CM

## 2017-07-12 DIAGNOSIS — N8110 Cystocele, unspecified: Secondary | ICD-10-CM

## 2017-07-12 NOTE — Patient Instructions (Signed)
1. Encounter for gynecological examination with abnormal finding Gynecologic exam status post total hysterectomy.  Pap test negative March 2018.  Not indicated this year.  Breast exam normal.  Screening mammogram was negative March 2019.  Health labs with family physician.  Colonoscopy 2017.  2. History of total hysterectomy  3. Menopause present Status post hysterectomy.  Well on no hormone replacement therapy.  4. Baden-Walker grade 4 cystocele Asymptomatic.  We will continue to observe.  Recommend avoiding pelvic pressure and over filling of bladder.  5. Screening for osteoporosis Vitamin D supplements, calcium rich nutrition and regular weightbearing physical activity recommended.  Will schedule bone density here now. - DG Bone Density; Future  Amanda Eaton, it was a pleasure seeing you today!  I will inform you of your bone density results as soon as they become available.   Health Maintenance for Postmenopausal Women Menopause is a normal process in which your reproductive ability comes to an end. This process happens gradually over a span of months to years, usually between the ages of 66 and 4. Menopause is complete when you have missed 12 consecutive menstrual periods. It is important to talk with your health care provider about some of the most common conditions that affect postmenopausal women, such as heart disease, cancer, and bone loss (osteoporosis). Adopting a healthy lifestyle and getting preventive care can help to promote your health and wellness. Those actions can also lower your chances of developing some of these common conditions. What should I know about menopause? During menopause, you may experience a number of symptoms, such as:  Moderate-to-severe hot flashes.  Night sweats.  Decrease in sex drive.  Mood swings.  Headaches.  Tiredness.  Irritability.  Memory problems.  Insomnia.  Choosing to treat or not to treat menopausal changes is an individual  decision that you make with your health care provider. What should I know about hormone replacement therapy and supplements? Hormone therapy products are effective for treating symptoms that are associated with menopause, such as hot flashes and night sweats. Hormone replacement carries certain risks, especially as you become older. If you are thinking about using estrogen or estrogen with progestin treatments, discuss the benefits and risks with your health care provider. What should I know about heart disease and stroke? Heart disease, heart attack, and stroke become more likely as you age. This may be due, in part, to the hormonal changes that your body experiences during menopause. These can affect how your body processes dietary fats, triglycerides, and cholesterol. Heart attack and stroke are both medical emergencies. There are many things that you can do to help prevent heart disease and stroke:  Have your blood pressure checked at least every 1-2 years. High blood pressure causes heart disease and increases the risk of stroke.  If you are 26-72 years old, ask your health care provider if you should take aspirin to prevent a heart attack or a stroke.  Do not use any tobacco products, including cigarettes, chewing tobacco, or electronic cigarettes. If you need help quitting, ask your health care provider.  It is important to eat a healthy diet and maintain a healthy weight. ? Be sure to include plenty of vegetables, fruits, low-fat dairy products, and lean protein. ? Avoid eating foods that are high in solid fats, added sugars, or salt (sodium).  Get regular exercise. This is one of the most important things that you can do for your health. ? Try to exercise for at least 150 minutes each week. The  type of exercise that you do should increase your heart rate and make you sweat. This is known as moderate-intensity exercise. ? Try to do strengthening exercises at least twice each week. Do these  in addition to the moderate-intensity exercise.  Know your numbers.Ask your health care provider to check your cholesterol and your blood glucose. Continue to have your blood tested as directed by your health care provider.  What should I know about cancer screening? There are several types of cancer. Take the following steps to reduce your risk and to catch any cancer development as early as possible. Breast Cancer  Practice breast self-awareness. ? This means understanding how your breasts normally appear and feel. ? It also means doing regular breast self-exams. Let your health care provider know about any changes, no matter how small.  If you are 68 or older, have a clinician do a breast exam (clinical breast exam or CBE) every year. Depending on your age, family history, and medical history, it may be recommended that you also have a yearly breast X-ray (mammogram).  If you have a family history of breast cancer, talk with your health care provider about genetic screening.  If you are at high risk for breast cancer, talk with your health care provider about having an MRI and a mammogram every year.  Breast cancer (BRCA) gene test is recommended for women who have family members with BRCA-related cancers. Results of the assessment will determine the need for genetic counseling and BRCA1 and for BRCA2 testing. BRCA-related cancers include these types: ? Breast. This occurs in males or females. ? Ovarian. ? Tubal. This may also be called fallopian tube cancer. ? Cancer of the abdominal or pelvic lining (peritoneal cancer). ? Prostate. ? Pancreatic.  Cervical, Uterine, and Ovarian Cancer Your health care provider may recommend that you be screened regularly for cancer of the pelvic organs. These include your ovaries, uterus, and vagina. This screening involves a pelvic exam, which includes checking for microscopic changes to the surface of your cervix (Pap test).  For women ages 21-65,  health care providers may recommend a pelvic exam and a Pap test every three years. For women ages 70-65, they may recommend the Pap test and pelvic exam, combined with testing for human papilloma virus (HPV), every five years. Some types of HPV increase your risk of cervical cancer. Testing for HPV may also be done on women of any age who have unclear Pap test results.  Other health care providers may not recommend any screening for nonpregnant women who are considered low risk for pelvic cancer and have no symptoms. Ask your health care provider if a screening pelvic exam is right for you.  If you have had past treatment for cervical cancer or a condition that could lead to cancer, you need Pap tests and screening for cancer for at least 20 years after your treatment. If Pap tests have been discontinued for you, your risk factors (such as having a new sexual partner) need to be reassessed to determine if you should start having screenings again. Some women have medical problems that increase the chance of getting cervical cancer. In these cases, your health care provider may recommend that you have screening and Pap tests more often.  If you have a family history of uterine cancer or ovarian cancer, talk with your health care provider about genetic screening.  If you have vaginal bleeding after reaching menopause, tell your health care provider.  There are currently no reliable  tests available to screen for ovarian cancer.  Lung Cancer Lung cancer screening is recommended for adults 67-25 years old who are at high risk for lung cancer because of a history of smoking. A yearly low-dose CT scan of the lungs is recommended if you:  Currently smoke.  Have a history of at least 30 pack-years of smoking and you currently smoke or have quit within the past 15 years. A pack-year is smoking an average of one pack of cigarettes per day for one year.  Yearly screening should:  Continue until it has been  15 years since you quit.  Stop if you develop a health problem that would prevent you from having lung cancer treatment.  Colorectal Cancer  This type of cancer can be detected and can often be prevented.  Routine colorectal cancer screening usually begins at age 31 and continues through age 47.  If you have risk factors for colon cancer, your health care provider may recommend that you be screened at an earlier age.  If you have a family history of colorectal cancer, talk with your health care provider about genetic screening.  Your health care provider may also recommend using home test kits to check for hidden blood in your stool.  A small camera at the end of a tube can be used to examine your colon directly (sigmoidoscopy or colonoscopy). This is done to check for the earliest forms of colorectal cancer.  Direct examination of the colon should be repeated every 5-10 years until age 29. However, if early forms of precancerous polyps or small growths are found or if you have a family history or genetic risk for colorectal cancer, you may need to be screened more often.  Skin Cancer  Check your skin from head to toe regularly.  Monitor any moles. Be sure to tell your health care provider: ? About any new moles or changes in moles, especially if there is a change in a mole's shape or color. ? If you have a mole that is larger than the size of a pencil eraser.  If any of your family members has a history of skin cancer, especially at a young age, talk with your health care provider about genetic screening.  Always use sunscreen. Apply sunscreen liberally and repeatedly throughout the day.  Whenever you are outside, protect yourself by wearing long sleeves, pants, a wide-brimmed hat, and sunglasses.  What should I know about osteoporosis? Osteoporosis is a condition in which bone destruction happens more quickly than new bone creation. After menopause, you may be at an increased  risk for osteoporosis. To help prevent osteoporosis or the bone fractures that can happen because of osteoporosis, the following is recommended:  If you are 59-30 years old, get at least 1,000 mg of calcium and at least 600 mg of vitamin D per day.  If you are older than age 53 but younger than age 54, get at least 1,200 mg of calcium and at least 600 mg of vitamin D per day.  If you are older than age 31, get at least 1,200 mg of calcium and at least 800 mg of vitamin D per day.  Smoking and excessive alcohol intake increase the risk of osteoporosis. Eat foods that are rich in calcium and vitamin D, and do weight-bearing exercises several times each week as directed by your health care provider. What should I know about how menopause affects my mental health? Depression may occur at any age, but it is more  common as you become older. Common symptoms of depression include:  Low or sad mood.  Changes in sleep patterns.  Changes in appetite or eating patterns.  Feeling an overall lack of motivation or enjoyment of activities that you previously enjoyed.  Frequent crying spells.  Talk with your health care provider if you think that you are experiencing depression. What should I know about immunizations? It is important that you get and maintain your immunizations. These include:  Tetanus, diphtheria, and pertussis (Tdap) booster vaccine.  Influenza every year before the flu season begins.  Pneumonia vaccine.  Shingles vaccine.  Your health care provider may also recommend other immunizations. This information is not intended to replace advice given to you by your health care provider. Make sure you discuss any questions you have with your health care provider. Document Released: 06/02/2005 Document Revised: 10/29/2015 Document Reviewed: 01/12/2015 Elsevier Interactive Patient Education  2018 [employer].

## 2017-07-12 NOTE — Progress Notes (Signed)
Amanda Eaton [birth year] [id number]   History:    62 y.o. G3P3L3 Married.  Enjoying spending time with her 4 grand-children.  RP:  Established patient presenting for annual gyn exam   HPI: Status post hysterectomy.  Well on no hormone replacement therapy.  No pelvic pain.  Sexually active with no problem.  Urine normal, no urinary incontinence.  Known cystocele, but asymptomatic.  Bowel movements normal.  Breasts normal.  Screening mammogram March 2019 negative.  Health labs with family physician.  Body mass index 29.99.  Active lifestyle.  Past medical history,surgical history, family history and social history were all reviewed and documented in the EPIC chart.  Gynecologic History No LMP recorded. Patient has had a hysterectomy. Contraception: status post hysterectomy Last Pap: 06/2016. Results were: Negative Last mammogram: 06/2017. Results were: Negative Bone Density: Never, will schedule here Colonoscopy: 2017  Obstetric History OB History  Gravida Para Term Preterm AB Living  3 3       3   SAB TAB Ectopic Multiple Live Births               # Outcome Date GA Lbr Len/2nd Weight Sex Delivery Anes PTL Lv  3 Para           2 Para           1 Para              ROS: A ROS was performed and pertinent positives and negatives are included in the history.  GENERAL: No fevers or chills. HEENT: No change in vision, no earache, sore throat or sinus congestion. NECK: No pain or stiffness. CARDIOVASCULAR: No chest pain or pressure. No palpitations. PULMONARY: No shortness of breath, cough or wheeze. GASTROINTESTINAL: No abdominal pain, nausea, vomiting or diarrhea, melena or bright red blood per rectum. GENITOURINARY: No urinary frequency, urgency, hesitancy or dysuria. MUSCULOSKELETAL: No joint or muscle pain, no back pain, no recent trauma. DERMATOLOGIC: No rash, no itching, no lesions. ENDOCRINE: No polyuria, polydipsia, no heat or cold intolerance. No recent change in weight.  HEMATOLOGICAL: No anemia or easy bruising or bleeding. NEUROLOGIC: No headache, seizures, numbness, tingling or weakness. PSYCHIATRIC: No depression, no loss of interest in normal activity or change in sleep pattern.     Exam:   BP 132/78   Ht 5' 5.5" (1.664 m)   Wt 183 lb (83 kg)   BMI 29.99 kg/m   Body mass index is 29.99 kg/m.  General appearance : Well developed well nourished female. No acute distress HEENT: Eyes: no retinal hemorrhage or exudates,  Neck supple, trachea midline, no carotid bruits, no thyroidmegaly Lungs: Clear to auscultation, no rhonchi or wheezes, or rib retractions  Heart: Regular rate and rhythm, no murmurs or gallops Breast:Examined in sitting and supine position were symmetrical in appearance, no palpable masses or tenderness,  no skin retraction, no nipple inversion, no nipple discharge, no skin discoloration, no axillary or supraclavicular lymphadenopathy Abdomen: no palpable masses or tenderness, no rebound or guarding Extremities: no edema or skin discoloration or tenderness  Pelvic: Vulva: Normal             Vagina: No gross lesions or discharge  Cervix/Uterus absent  Adnexa  Without masses or tenderness  Anus: Normal   Assessment/Plan:  62 y.o. female for annual exam   1. Encounter for gynecological examination with abnormal finding Gynecologic exam status post total hysterectomy.  Pap test negative March 2018.  Not indicated this year.  Breast exam normal.  Screening mammogram was negative March 2019.  Health labs with family physician.  Colonoscopy 2017.  2. History of total hysterectomy  3. Menopause present Status post hysterectomy.  Well on no hormone replacement therapy.  4. Baden-Walker grade 4 cystocele Asymptomatic.  We will continue to observe.  Recommend avoiding pelvic pressure and over filling of bladder.  5. Screening for osteoporosis Vitamin D supplements, calcium rich nutrition and regular weightbearing physical activity  recommended.  Will schedule bone density here now. - DG Bone Density; Future  Princess Bruins MD, 11:13 AM 07/12/2017

## 2017-07-13 ENCOUNTER — Encounter: Payer: Self-pay | Admitting: Family Medicine

## 2017-08-02 ENCOUNTER — Encounter: Payer: Self-pay | Admitting: Internal Medicine

## 2017-08-23 ENCOUNTER — Encounter: Payer: Self-pay | Admitting: Adult Health

## 2017-08-23 ENCOUNTER — Ambulatory Visit: Payer: BLUE CROSS/BLUE SHIELD | Admitting: Adult Health

## 2017-08-23 VITALS — BP 104/60 | Temp 98.1°F | Wt 184.0 lb

## 2017-08-23 DIAGNOSIS — T148XXA Other injury of unspecified body region, initial encounter: Secondary | ICD-10-CM | POA: Diagnosis not present

## 2017-08-23 NOTE — Addendum Note (Signed)
Addended by: Apolinar Junes on: 08/23/2017 02:51 PM   Modules accepted: Level of Service

## 2017-08-23 NOTE — Progress Notes (Addendum)
Subjective:    Patient ID: Amanda Eaton, female    DOB: Dec 05, 1955, 62 y.o.   MRN: [id number]  HPI  62 year old female who  has a past medical history of DIABETES MELLITUS, TYPE II (11/23/2006), HYPERLIPIDEMIA, BORDERLINE (04/08/2008), HYPERTENSION (11/23/2006), and WEIGHT GAIN (10/12/2008).   She presents for an acute issue of a " brown spot" on her right ankle. Has been present for unknown amount of time. She feels as the area is getting larger.   Denies any pain or trauma to the area   Review of Systems See HPI   Past Medical History:  Diagnosis Date  . DIABETES MELLITUS, TYPE II 11/23/2006  . [place], [place] 04/08/2008  . HYPERTENSION 11/23/2006  . WEIGHT GAIN 10/12/2008    Social History   Socioeconomic History  . Marital status: Married    Spouse name: Not on file  . Number of children: Not on file  . Years of education: Not on file  . Highest education level: Not on file  Occupational History  . Not on file  Social Needs  . Financial resource strain: Not on file  . Food insecurity:    Worry: Not on file    Inability: Not on file  . Transportation needs:    Medical: Not on file    Non-medical: Not on file  Tobacco Use  . Smoking status: Never Smoker  . Smokeless tobacco: Never Used  Substance and Sexual Activity  . Alcohol use: No    Alcohol/week: 0.0 oz  . Drug use: No  . Sexual activity: Yes    Partners: Male    Comment: 1st intercourse- 37, married- 31 yrs   Lifestyle  . Physical activity:    Days per week: Not on file    Minutes per session: Not on file  . Stress: Not on file  Relationships  . Social connections:    Talks on phone: Not on file    Gets together: Not on file    Attends religious service: Not on file    Active member of club or organization: Not on file    Attends meetings of clubs or organizations: Not on file    Relationship status: Not on file  . Intimate partner violence:    Fear of current or ex partner: Not on file   Emotionally abused: Not on file    Physically abused: Not on file    Forced sexual activity: Not on file  Other Topics Concern  . 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 All live locally     Past Surgical History:  Procedure Laterality Date  . ANTERIOR AND POSTERIOR REPAIR N/A 07/17/2012   Procedure: ANTERIOR (CYSTOCELE) AND POSTERIOR REPAIR (RECTOCELE);  Surgeon: Princess Bruins, MD;  Location: [facility];  Service: Gynecology;  Laterality: N/A;  . CATARACT EXTRACTION  2015  . HYSTEROSCOPY W/ ENDOMETRIAL ABLATION     1987  . MYOMECTOMY VAGINAL APPROACH    . ROBOTIC ASSISTED TOTAL HYSTERECTOMY N/A 07/17/2012   Procedure: ROBOTIC ASSISTED TOTAL HYSTERECTOMY with Utero-sacral Ligament Suspension;  Surgeon: Princess Bruins, MD;  Location: [facility];  Service: Gynecology;  Laterality: N/A;    Family History  Problem Relation Age of Onset  . Diabetes Mother   . Hyperlipidemia Mother   . Hypertension Mother   . COPD Father   . Lung  cancer Brother        lung  . Diabetes Maternal Grandfather   . Colon cancer Neg Hx     No Known Allergies  Current Outpatient Medications on File Prior to Visit  Medication Sig Dispense Refill  . ibuprofen (ADVIL,MOTRIN) 200 MG tablet Take 400 mg by mouth every 6 (six) hours as needed for moderate pain. Reported on 05/10/2015    . lisinopril-hydrochlorothiazide (PRINZIDE,ZESTORETIC) 10-12.5 MG tablet Take 1 tablet daily by mouth. 90 tablet 3  . SitaGLIPtin-MetFORMIN HCl (JANUMET XR) [phone] MG TB24 Take 1 tablet daily by mouth. 90 tablet 1  . simvastatin (ZOCOR) 5 MG tablet Take 1 tablet (5 mg total) daily by mouth. (Patient not taking: Reported on 07/12/2017) 90 tablet 3   No current facility-administered medications on file prior to visit.     BP 104/60   Temp 98.1 F (36.7 C) (Oral)   Wt 184 lb (83.5 kg)   BMI 30.15 kg/m       Objective:     Physical Exam  Constitutional: She is oriented to person, place, and time. She appears well-developed and well-nourished. No distress.  Neurological: She is alert and oriented to person, place, and time.  Skin: Skin is warm and dry. Capillary refill takes less than 2 seconds.  Quarter sized area on right ankle. Appears as healing hematoma   Psychiatric: She has a normal mood and affect. Her behavior is normal. Judgment and thought content normal.  Nursing note and vitals reviewed.     Assessment & Plan:  1. Hematoma - Appears as superficial hematoma  - Follow up if area gets larger  - Consider referral to dermatology   Dorothyann Peng, NP

## 2017-09-18 ENCOUNTER — Telehealth: Payer: Self-pay | Admitting: Adult Health

## 2017-09-18 DIAGNOSIS — E1169 Type 2 diabetes mellitus with other specified complication: Secondary | ICD-10-CM

## 2017-09-18 DIAGNOSIS — E669 Obesity, unspecified: Principal | ICD-10-CM

## 2017-09-18 NOTE — Telephone Encounter (Signed)
Copied from [place] [id number]. Topic: Quick Communication - Rx Refill/Question >> Sep 18, 2017  5:13 PM Robina Ade, Helene Kelp D wrote: Medication: SitaGLIPtin-MetFORMIN HCl (JANUMET XR) [phone] MG TB24,simvastatin (ZOCOR) 5 MG tablet  Has the patient contacted their pharmacy? Yes (Agent: If no, request that the patient contact the pharmacy for the refill.) (Agent: If yes, when and what did the pharmacy advise?)  Preferred Pharmacy (with phone number or street name): [facility], [place] to Registered [facility]  Agent: Please be advised that RX refills may take up to 3 business days. We ask that you follow-up with your pharmacy.

## 2017-09-19 MED ORDER — SITAGLIP PHOS-METFORMIN HCL ER 100-1000 MG PO TB24: 1.0000 | ORAL_TABLET | Freq: Every day | ORAL | 1 refills | Status: DC

## 2017-09-19 NOTE — Telephone Encounter (Signed)
Left message for pt that medication was going to be filled and sent to requested pharmacy.

## 2017-12-19 ENCOUNTER — Ambulatory Visit: Payer: BLUE CROSS/BLUE SHIELD | Admitting: Adult Health

## 2017-12-19 ENCOUNTER — Encounter: Payer: Self-pay | Admitting: Adult Health

## 2017-12-19 VITALS — BP 108/62 | Temp 98.3°F | Wt 180.0 lb

## 2017-12-19 DIAGNOSIS — H6122 Impacted cerumen, left ear: Secondary | ICD-10-CM | POA: Diagnosis not present

## 2017-12-19 DIAGNOSIS — H6690 Otitis media, unspecified, unspecified ear: Secondary | ICD-10-CM

## 2017-12-19 MED ORDER — AMOXICILLIN-POT CLAVULANATE 875-125 MG PO TABS: 1.0000 | ORAL_TABLET | Freq: Two times a day (BID) | ORAL | 0 refills | Status: DC

## 2017-12-19 NOTE — Progress Notes (Signed)
Subjective:    Patient ID: Amanda Eaton, female    DOB: 1955-05-10, 62 y.o.   MRN: [id number]  URI   This is a new problem. The maximum temperature recorded prior to her arrival was 100.4 - 100.9 F. The fever has been present for less than 1 day. Associated symptoms include coughing (dry ), ear pain and a plugged ear sensation. Pertinent negatives include no congestion, headaches, nausea, neck pain, rhinorrhea, sinus pain, sneezing, sore throat or wheezing. She has tried antihistamine for the symptoms. The treatment provided mild relief.      Review of Systems  Constitutional: Negative.   HENT: Positive for ear pain and hearing loss. Negative for congestion, ear discharge, postnasal drip, rhinorrhea, sinus pressure, sinus pain, sneezing and sore throat.   Respiratory: Positive for cough (dry ). Negative for wheezing.   Gastrointestinal: Negative for nausea.  Musculoskeletal: Negative for neck pain.  Neurological: Negative for headaches.  All other systems reviewed and are negative.  Past Medical History:  Diagnosis Date  . DIABETES MELLITUS, TYPE II 11/23/2006  . [place], [place] 04/08/2008  . HYPERTENSION 11/23/2006  . WEIGHT GAIN 10/12/2008    Social History   Socioeconomic History  . Marital status: Married    Spouse name: Not on file  . Number of children: Not on file  . Years of education: Not on file  . Highest education level: Not on file  Occupational History  . Not on file  Social Needs  . Financial resource strain: Not on file  . Food insecurity:    Worry: Not on file    Inability: Not on file  . Transportation needs:    Medical: Not on file    Non-medical: Not on file  Tobacco Use  . Smoking status: Never Smoker  . Smokeless tobacco: Never Used  Substance and Sexual Activity  . Alcohol use: No    Alcohol/week: 0.0 standard drinks  . Drug use: No  . Sexual activity: Yes    Partners: Male    Comment: 1st intercourse- 34, married- 71 yrs     Lifestyle  . Physical activity:    Days per week: Not on file    Minutes per session: Not on file  . Stress: Not on file  Relationships  . Social connections:    Talks on phone: Not on file    Gets together: Not on file    Attends religious service: Not on file    Active member of club or organization: Not on file    Attends meetings of clubs or organizations: Not on file    Relationship status: Not on file  . Intimate partner violence:    Fear of current or ex partner: Not on file    Emotionally abused: Not on file    Physically abused: Not on file    Forced sexual activity: Not on file  Other Topics Concern  . 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 All live locally     Past Surgical History:  Procedure Laterality Date  . ANTERIOR AND POSTERIOR REPAIR N/A 07/17/2012   Procedure: ANTERIOR (CYSTOCELE) AND POSTERIOR REPAIR (RECTOCELE);  Surgeon: Princess Bruins, MD;  Location: [facility];  Service: Gynecology;  Laterality: N/A;  . CATARACT EXTRACTION  2015  . HYSTEROSCOPY W/ ENDOMETRIAL ABLATION     1987  . MYOMECTOMY VAGINAL  APPROACH    . ROBOTIC ASSISTED TOTAL HYSTERECTOMY N/A 07/17/2012   Procedure: ROBOTIC ASSISTED TOTAL HYSTERECTOMY with Utero-sacral Ligament Suspension;  Surgeon: Princess Bruins, MD;  Location: [facility];  Service: Gynecology;  Laterality: N/A;    Family History  Problem Relation Age of Onset  . Diabetes Mother   . Hyperlipidemia Mother   . Hypertension Mother   . COPD Father   . Lung cancer Brother        lung  . Diabetes Maternal Grandfather   . Colon cancer Neg Hx     No Known Allergies  Current Outpatient Medications on File Prior to Visit  Medication Sig Dispense Refill  . ibuprofen (ADVIL,MOTRIN) 200 MG tablet Take 400 mg by mouth every 6 (six) hours as needed for moderate pain. Reported on 05/10/2015    .  lisinopril-hydrochlorothiazide (PRINZIDE,ZESTORETIC) 10-12.5 MG tablet Take 1 tablet daily by mouth. 90 tablet 3  . SitaGLIPtin-MetFORMIN HCl (JANUMET XR) [phone] MG TB24 Take 1 tablet by mouth daily. 90 tablet 1  . simvastatin (ZOCOR) 5 MG tablet Take 1 tablet (5 mg total) daily by mouth. (Patient not taking: Reported on 07/12/2017) 90 tablet 3   No current facility-administered medications on file prior to visit.     BP 108/62   Temp 98.3 F (36.8 C) (Oral)   Wt 180 lb (81.6 kg)   BMI 29.50 kg/m       Objective:   Physical Exam  Constitutional: She appears well-developed and well-nourished.  HENT:  Right Ear: Hearing, tympanic membrane, external ear and ear canal normal.  Left Ear: Tympanic membrane is erythematous and bulging. A middle ear effusion is present.  Cerumen impaction in left ear   Eyes: Pupils are equal, round, and reactive to light. EOM are normal.  Neck: Normal range of motion. Neck supple. No thyromegaly present.  Cardiovascular: Normal rate, regular rhythm, normal heart sounds and intact distal pulses.  Pulmonary/Chest: Effort normal and breath sounds normal.  Skin: Skin is warm. She is not diaphoretic.  Nursing note and vitals reviewed.     Assessment & Plan:  1. Impacted cerumen of left ear - Cerumen impaction was easily removed with irrigation. Patient tolerated procedure well. Once cerumen was removed, it was noticed that left TM was bulging and erythematous. She denies improvement in her symptoms.   2. Acute otitis media, unspecified otitis media type  - amoxicillin-clavulanate (AUGMENTIN) 875-125 MG tablet; Take 1 tablet by mouth 2 (two) times daily.  Dispense: 20 tablet; Refill: 0  Dorothyann Peng, NP

## 2017-12-25 ENCOUNTER — Telehealth: Payer: Self-pay | Admitting: Adult Health

## 2017-12-25 NOTE — Telephone Encounter (Signed)
Copied from [place]. Topic: Quick Communication - See Telephone Encounter >> Dec 25, 2017  7:26 AM Conception Chancy, NT wrote: patient was seen on 12/19/17 for a left ear infection/clogged. Patient states she was told to call back today 12/25/17 if it was not better. Patient states it is still clogged and would like something else called in for this. Please advise, patient states okay to leave voicemail.  [place], [place] - [address] [place] [place] [address] Phone: [phone] Fax: [phone]

## 2017-12-25 NOTE — Telephone Encounter (Signed)
She should still have about 5 days of Augmentin. Is she feeling any improvement?

## 2017-12-25 NOTE — Telephone Encounter (Signed)
Patient said she will try these, thanks!

## 2017-12-25 NOTE — Telephone Encounter (Signed)
She can use Afrin x 3 days or Flonase

## 2017-12-25 NOTE — Telephone Encounter (Signed)
Spoke to the pt and advised that she should finish the antibiotics.  She states she can hear a "swooshing" sound in her ear and it feels clogged.  Was wondering if there was an ear drop she could try.  Other recommendations?

## 2017-12-25 NOTE — Telephone Encounter (Signed)
Left a message for a return call.

## 2018-02-05 ENCOUNTER — Ambulatory Visit: Payer: BLUE CROSS/BLUE SHIELD | Admitting: Adult Health

## 2018-02-05 ENCOUNTER — Encounter: Payer: Self-pay | Admitting: Adult Health

## 2018-02-05 VITALS — BP 120/84 | HR 62 | Temp 97.9°F | Ht 66.0 in | Wt 180.3 lb

## 2018-02-05 DIAGNOSIS — E782 Mixed hyperlipidemia: Secondary | ICD-10-CM

## 2018-02-05 DIAGNOSIS — E669 Obesity, unspecified: Secondary | ICD-10-CM

## 2018-02-05 DIAGNOSIS — E1169 Type 2 diabetes mellitus with other specified complication: Secondary | ICD-10-CM

## 2018-02-05 DIAGNOSIS — Z23 Encounter for immunization: Secondary | ICD-10-CM

## 2018-02-05 LAB — LIPID PANEL
Cholesterol: 164 mg/dL (ref 0–200)
HDL: 70.3 mg/dL (ref >39.00)
LDL Cholesterol: 83 mg/dL (ref 0–99)
NonHDL: 93.88
TRIGLYCERIDES: 52 mg/dL (ref 0.0–149.0)
Total CHOL/HDL Ratio: 2
VLDL: 10.4 mg/dL (ref 0.0–40.0)

## 2018-02-05 LAB — POCT GLYCOSYLATED HEMOGLOBIN (HGB A1C): Hemoglobin A1C: 5.8 % — AB (ref 4.0–5.6)

## 2018-02-05 NOTE — Progress Notes (Signed)
Subjective:    Patient ID: Amanda Eaton, female    DOB: [birth year], 62 y.o.   MRN: [id number]  HPI 62 year old female who  has a past medical history of DIABETES MELLITUS, TYPE II (11/23/2006), HYPERLIPIDEMIA, BORDERLINE (04/08/2008), HYPERTENSION (11/23/2006), and WEIGHT GAIN (10/12/2008).  She presents to the office today for follow up regarding DM. She is currently prescribed Janumet [phone] mg daily. Her last A1c was 6.2 in 02/2017.  Lab Results  Component Value Date   HGBA1C 6.2 03/06/2017   She has been working on her diet and is eating more fish and lean meats.   She does not check her blood sugars on a regular basis but this morning her blood sugar was 101. She denies any hypoglycemia    She needs her cholesterol for work    Review of Systems See HPI   Past Medical History:  Diagnosis Date  . DIABETES MELLITUS, TYPE II 11/23/2006  . [place], [place] 04/08/2008  . HYPERTENSION 11/23/2006  . WEIGHT GAIN 10/12/2008    Social History   Socioeconomic History  . Marital status: Married    Spouse name: Not on file  . Number of children: Not on file  . Years of education: Not on file  . Highest education level: Not on file  Occupational History  . Not on file  Social Needs  . Financial resource strain: Not on file  . Food insecurity:    Worry: Not on file    Inability: Not on file  . Transportation needs:    Medical: Not on file    Non-medical: Not on file  Tobacco Use  . Smoking status: Never Smoker  . Smokeless tobacco: Never Used  Substance and Sexual Activity  . Alcohol use: No    Alcohol/week: 0.0 standard drinks  . Drug use: No  . Sexual activity: Yes    Partners: Male    Comment: 1st intercourse- 12, married- 62 yrs   Lifestyle  . Physical activity:    Days per week: Not on file    Minutes per session: Not on file  . Stress: Not on file  Relationships  . Social connections:    Talks on phone: Not on file    Gets together: Not on file   Attends religious service: Not on file    Active member of club or organization: Not on file    Attends meetings of clubs or organizations: Not on file    Relationship status: Not on file  . Intimate partner violence:    Fear of current or ex partner: Not on file    Emotionally abused: Not on file    Physically abused: Not on file    Forced sexual activity: Not on file  Other Topics Concern  . 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 All live locally     Past Surgical History:  Procedure Laterality Date  . ANTERIOR AND POSTERIOR REPAIR N/A 07/17/2012   Procedure: ANTERIOR (CYSTOCELE) AND POSTERIOR REPAIR (RECTOCELE);  Surgeon: Princess Bruins, MD;  Location: [facility];  Service: Gynecology;  Laterality: N/A;  . CATARACT EXTRACTION  2015  . HYSTEROSCOPY W/ ENDOMETRIAL ABLATION     1987  . MYOMECTOMY VAGINAL APPROACH    . ROBOTIC ASSISTED TOTAL HYSTERECTOMY N/A 07/17/2012   Procedure: ROBOTIC ASSISTED TOTAL HYSTERECTOMY with Utero-sacral Ligament Suspension;  Surgeon: Princess Bruins, MD;  Location: [facility];  Service: Gynecology;  Laterality: N/A;    Family History  Problem Relation Age of Onset  . Diabetes Mother   . Hyperlipidemia Mother   . Hypertension Mother   . COPD Father   . Lung cancer Brother        lung  . Diabetes Maternal Grandfather   . Colon cancer Neg Hx     No Known Allergies  Current Outpatient Medications on File Prior to Visit  Medication Sig Dispense Refill  . ibuprofen (ADVIL,MOTRIN) 200 MG tablet Take 400 mg by mouth every 6 (six) hours as needed for moderate pain. Reported on 05/10/2015    . lisinopril-hydrochlorothiazide (PRINZIDE,ZESTORETIC) 10-12.5 MG tablet Take 1 tablet daily by mouth. 90 tablet 3  . Multiple Vitamins-Minerals (WOMENS MULTI VITAMIN & MINERAL PO) Take by mouth.    . simvastatin (ZOCOR) 5 MG tablet Take 1 tablet (5 mg total)  daily by mouth. 90 tablet 3  . SitaGLIPtin-MetFORMIN HCl (JANUMET XR) [phone] MG TB24 Take 1 tablet by mouth daily. 90 tablet 1  . TURMERIC PO Take by mouth.     No current facility-administered medications on file prior to visit.     BP 120/84 (BP Location: Left Arm, Patient Position: Sitting, Cuff Size: Large)   Pulse 62   Temp 97.9 F (36.6 C) (Oral)   Ht 5\' 6"  (1.676 m)   Wt 180 lb 4.8 oz (81.8 kg)   SpO2 98%   BMI 29.10 kg/m       Objective:   Physical Exam  Constitutional: She is oriented to person, place, and time. She appears well-developed and well-nourished. No distress.  Cardiovascular: Normal rate, regular rhythm, normal heart sounds and intact distal pulses.  Pulmonary/Chest: Effort normal and breath sounds normal.  Neurological: She is alert and oriented to person, place, and time.  Skin: Skin is warm and dry. She is not diaphoretic.  Psychiatric: She has a normal mood and affect. Her behavior is normal. Judgment and thought content normal.  Nursing note and vitals reviewed.     Assessment & Plan:  1. Diabetes mellitus type 2 in obese (HCC) - POCT glycosylated hemoglobin (Hb A1C) - Has improved to 5.8  - Continue to eat healthy and start exercising   2. Mixed hyperlipidemia - Lipid panel  3. Needs flu shot  - Flu Vaccine QUAD 6+ mos PF IM (Fluarix Quad PF)   Dorothyann Peng, NP

## 2018-02-25 ENCOUNTER — Other Ambulatory Visit: Payer: Self-pay | Admitting: Adult Health

## 2018-02-25 DIAGNOSIS — E669 Obesity, unspecified: Principal | ICD-10-CM

## 2018-02-25 DIAGNOSIS — E1169 Type 2 diabetes mellitus with other specified complication: Secondary | ICD-10-CM

## 2018-04-09 ENCOUNTER — Encounter: Payer: Self-pay | Admitting: Adult Health

## 2018-04-09 ENCOUNTER — Ambulatory Visit (INDEPENDENT_AMBULATORY_CARE_PROVIDER_SITE_OTHER): Payer: BLUE CROSS/BLUE SHIELD | Admitting: Adult Health

## 2018-04-09 VITALS — BP 100/66 | Temp 98.1°F | Ht 65.5 in | Wt 181.0 lb

## 2018-04-09 DIAGNOSIS — Z1159 Encounter for screening for other viral diseases: Secondary | ICD-10-CM | POA: Diagnosis not present

## 2018-04-09 DIAGNOSIS — E1169 Type 2 diabetes mellitus with other specified complication: Secondary | ICD-10-CM

## 2018-04-09 DIAGNOSIS — Z114 Encounter for screening for human immunodeficiency virus [HIV]: Secondary | ICD-10-CM

## 2018-04-09 DIAGNOSIS — I10 Essential (primary) hypertension: Secondary | ICD-10-CM | POA: Diagnosis not present

## 2018-04-09 DIAGNOSIS — Z0000 Encounter for general adult medical examination without abnormal findings: Secondary | ICD-10-CM | POA: Diagnosis not present

## 2018-04-09 DIAGNOSIS — E782 Mixed hyperlipidemia: Secondary | ICD-10-CM | POA: Diagnosis not present

## 2018-04-09 DIAGNOSIS — E669 Obesity, unspecified: Secondary | ICD-10-CM

## 2018-04-09 LAB — CBC WITH DIFFERENTIAL/PLATELET
BASOS PCT: 0.5 % (ref 0.0–3.0)
Basophils Absolute: 0 10*3/uL (ref 0.0–0.1)
EOS PCT: 2.2 % (ref 0.0–5.0)
Eosinophils Absolute: 0.1 10*3/uL (ref 0.0–0.7)
HCT: 37.5 % (ref 36.0–46.0)
Hemoglobin: 12.3 g/dL (ref 12.0–15.0)
LYMPHS ABS: 1.2 10*3/uL (ref 0.7–4.0)
Lymphocytes Relative: 32.7 % (ref 12.0–46.0)
MCHC: 32.9 g/dL (ref 30.0–36.0)
MCV: 88.3 fl (ref 78.0–100.0)
MONOS PCT: 6.8 % (ref 3.0–12.0)
Monocytes Absolute: 0.3 10*3/uL (ref 0.1–1.0)
NEUTROS ABS: 2.1 10*3/uL (ref 1.4–7.7)
NEUTROS PCT: 57.8 % (ref 43.0–77.0)
PLATELETS: 197 10*3/uL (ref 150.0–400.0)
RBC: 4.25 Mil/uL (ref 3.87–5.11)
RDW: 13.6 % (ref 11.5–15.5)
WBC: 3.7 10*3/uL — ABNORMAL LOW (ref 4.0–10.5)

## 2018-04-09 LAB — COMPREHENSIVE METABOLIC PANEL
ALT: 16 U/L (ref 0–35)
AST: 16 U/L (ref 0–37)
Albumin: 4.2 g/dL (ref 3.5–5.2)
Alkaline Phosphatase: 51 U/L (ref 39–117)
BUN: 20 mg/dL (ref 6–23)
CO2: 30 meq/L (ref 19–32)
Calcium: 9.2 mg/dL (ref 8.4–10.5)
Chloride: 104 mEq/L (ref 96–112)
Creatinine, Ser: 0.66 mg/dL (ref 0.40–1.20)
GFR: 95.93 mL/min (ref >60.00)
GLUCOSE: 92 mg/dL (ref 70–99)
POTASSIUM: 4.2 meq/L (ref 3.5–5.1)
Sodium: 141 mEq/L (ref 135–145)
Total Bilirubin: 0.5 mg/dL (ref 0.2–1.2)
Total Protein: 6.3 g/dL (ref 6.0–8.3)

## 2018-04-09 LAB — LIPID PANEL
CHOL/HDL RATIO: 2
Cholesterol: 160 mg/dL (ref 0–200)
HDL: 74.7 mg/dL (ref >39.00)
LDL Cholesterol: 77 mg/dL (ref 0–99)
NonHDL: 85.23
Triglycerides: 43 mg/dL (ref 0.0–149.0)
VLDL: 8.6 mg/dL (ref 0.0–40.0)

## 2018-04-09 LAB — HEMOGLOBIN A1C: Hgb A1c MFr Bld: 6.1 % (ref 4.6–6.5)

## 2018-04-09 LAB — TSH: TSH: 1.32 u[IU]/mL (ref 0.35–4.50)

## 2018-04-09 MED ORDER — SIMVASTATIN 5 MG PO TABS: 5.0000 mg | ORAL_TABLET | Freq: Every day | ORAL | 3 refills | Status: DC

## 2018-04-09 NOTE — Progress Notes (Signed)
Subjective:    Patient ID: Amanda Eaton, female    DOB: 1955/05/21, 62 y.o.   MRN: [id number]  HPI  Patient presents for yearly preventative medicine examination. She is a pleasant 62 year old female who  has a past medical history of DIABETES MELLITUS, TYPE II (11/23/2006), HYPERLIPIDEMIA, BORDERLINE (04/08/2008), HYPERTENSION (11/23/2006), and WEIGHT GAIN (10/12/2008).   DM, type 2 -her blood sugars have been well controlled in the past.  She monitors her blood sugars periodically at home and has been getting readings from 90-100.  Currently she is prescribed Janumet. Denies hypoglycemic episodes  Lab Results  Component Value Date   HGBA1C 5.8 (A) 02/05/2018   Essential Hypertension -lisinopril-hydrochlorothiazide 10-12.5 mg- 1/2 tablet. Stable  BP Readings from Last 3 Encounters:  04/09/18 100/66  02/05/18 120/84  12/19/17 108/62   Hyperlipidemia - takes Zocor 5 mg  Lab Results  Component Value Date   CHOL 164 02/05/2018   HDL 70.30 02/05/2018   LDLCALC 83 02/05/2018   LDLDIRECT 158.8 10/04/2010   TRIG 52.0 02/05/2018   CHOLHDL 2 02/05/2018   All immunizations and health maintenance protocols were reviewed with the patient and needed orders were placed. utd  Appropriate screening laboratory values were ordered for the patient including screening of hyperlipidemia, renal function and hepatic function.  Medication reconciliation,  past medical history, social history, problem list and allergies were reviewed in detail with the patient  Goals were established with regard to weight loss, exercise, and  diet in compliance with medications.  She eats a heart healthy diet and stays active.  She does not exercise on a routine basis  Wt Readings from Last 3 Encounters:  04/09/18 181 lb (82.1 kg)  02/05/18 180 lb 4.8 oz (81.8 kg)  12/19/17 180 lb (81.6 kg)   End of life planning was discussed.  This she is up-to-date on all health maintenance items such as dental and vision  screens, colonoscopy, and mammogram.  Review of Systems  Constitutional: Negative.   HENT: Negative.   Eyes: Negative.   Respiratory: Negative.   Cardiovascular: Negative.   Gastrointestinal: Negative.   Endocrine: Negative.   Genitourinary: Negative.   Musculoskeletal: Negative.   Skin: Negative.   Allergic/Immunologic: Negative.   Neurological: Negative.   Hematological: Negative.   Psychiatric/Behavioral: Negative.    Past Medical History:  Diagnosis Date  . DIABETES MELLITUS, TYPE II 11/23/2006  . [name], [place] 04/08/2008  . HYPERTENSION 11/23/2006  . WEIGHT GAIN 10/12/2008    Social History   Socioeconomic History  . Marital status: Married    Spouse name: Not on file  . Number of children: Not on file  . Years of education: Not on file  . Highest education level: Not on file  Occupational History  . Not on file  Social Needs  . Financial resource strain: Not on file  . Food insecurity:    Worry: Not on file    Inability: Not on file  . Transportation needs:    Medical: Not on file    Non-medical: Not on file  Tobacco Use  . Smoking status: Never Smoker  . Smokeless tobacco: Never Used  Substance and Sexual Activity  . Alcohol use: No    Alcohol/week: 0.0 standard drinks  . Drug use: No  . Sexual activity: Yes    Partners: Male    Comment: 1st intercourse- 74, married- 62 yrs   Lifestyle  . Physical activity:    Days per week: Not on file  Minutes per session: Not on file  . Stress: Not on file  Relationships  . Social connections:    Talks on phone: Not on file    Gets together: Not on file    Attends religious service: Not on file    Active member of club or organization: Not on file    Attends meetings of clubs or organizations: Not on file    Relationship status: Not on file  . Intimate partner violence:    Fear of current or ex partner: Not on file    Emotionally abused: Not on file    Physically abused: Not on file    Forced  sexual activity: Not on file  Other Topics Concern  . 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 All live locally     Past Surgical History:  Procedure Laterality Date  . ANTERIOR AND POSTERIOR REPAIR N/A 07/17/2012   Procedure: ANTERIOR (CYSTOCELE) AND POSTERIOR REPAIR (RECTOCELE);  Surgeon: Princess Bruins, MD;  Location: [facility];  Service: Gynecology;  Laterality: N/A;  . CATARACT EXTRACTION  2015  . HYSTEROSCOPY W/ ENDOMETRIAL ABLATION     1987  . MYOMECTOMY VAGINAL APPROACH    . ROBOTIC ASSISTED TOTAL HYSTERECTOMY N/A 07/17/2012   Procedure: ROBOTIC ASSISTED TOTAL HYSTERECTOMY with Utero-sacral Ligament Suspension;  Surgeon: Princess Bruins, MD;  Location: [facility];  Service: Gynecology;  Laterality: N/A;    Family History  Problem Relation Age of Onset  . Diabetes Mother   . Hyperlipidemia Mother   . Hypertension Mother   . COPD Father   . Lung cancer Brother        lung  . Diabetes Maternal Grandfather   . Colon cancer Neg Hx     No Known Allergies  Current Outpatient Medications on File Prior to Visit  Medication Sig Dispense Refill  . ibuprofen (ADVIL,MOTRIN) 200 MG tablet Take 400 mg by mouth every 6 (six) hours as needed for moderate pain. Reported on 05/10/2015    . JANUMET XR [phone] MG TB24 TAKE 1 TABLET DAILY 90 tablet 1  . lisinopril-hydrochlorothiazide (PRINZIDE,ZESTORETIC) 10-12.5 MG tablet Take 1 tablet daily by mouth. (Patient taking differently: Take 0.5 tablets by mouth daily. ) 90 tablet 3  . Multiple Vitamins-Minerals (WOMENS MULTI VITAMIN & MINERAL PO) Take by mouth.    . TURMERIC PO Take by mouth.     No current facility-administered medications on file prior to visit.     BP 100/66   Temp 98.1 F (36.7 C)   Ht 5' 5.5" (1.664 m)   Wt 181 lb (82.1 kg)   BMI 29.66 kg/m       Objective:   Physical Exam Vitals signs and nursing note  reviewed.  Constitutional:      General: She is not in acute distress.    Appearance: Normal appearance. She is well-developed and normal weight.  HENT:     Head: Normocephalic and atraumatic.     Right Ear: Tympanic membrane, ear canal and external ear normal.     Left Ear: Tympanic membrane, ear canal and external ear normal.     Nose: Nose normal. No congestion or rhinorrhea.     Mouth/Throat:     Mouth: Mucous membranes are moist.     Pharynx: Oropharynx is clear. No oropharyngeal exudate or posterior oropharyngeal erythema.  Eyes:  General: No scleral icterus.       Right eye: No discharge.        Left eye: No discharge.     Extraocular Movements: Extraocular movements intact.     Conjunctiva/sclera: Conjunctivae normal.     Pupils: Pupils are equal, round, and reactive to light.  Neck:     Musculoskeletal: Normal range of motion and neck supple. No neck rigidity or muscular tenderness.     Thyroid: No thyromegaly.     Vascular: No carotid bruit.     Trachea: No tracheal deviation.  Cardiovascular:     Rate and Rhythm: Normal rate and regular rhythm.     Heart sounds: Normal heart sounds. No murmur. No friction rub. No gallop.   Pulmonary:     Effort: Pulmonary effort is normal. No respiratory distress.     Breath sounds: Normal breath sounds. No stridor. No wheezing, rhonchi or rales.  Chest:     Chest wall: No tenderness.  Abdominal:     General: Bowel sounds are normal. There is no distension.     Palpations: Abdomen is soft. There is no mass.     Tenderness: There is no abdominal tenderness. There is no right CVA tenderness, left CVA tenderness, guarding or rebound.     Hernia: No hernia is present.  Musculoskeletal: Normal range of motion.  Lymphadenopathy:     Cervical: No cervical adenopathy.  Skin:    General: Skin is warm and dry.     Capillary Refill: Capillary refill takes less than 2 seconds.     Coloration: Skin is not jaundiced or pale.     Findings:  No bruising, erythema, lesion or rash.  Neurological:     General: No focal deficit present.     Mental Status: She is alert and oriented to person, place, and time. Mental status is at baseline.     Cranial Nerves: No cranial nerve deficit.     Coordination: Coordination normal.  Psychiatric:        Mood and Affect: Mood normal.        Behavior: Behavior normal.        Thought Content: Thought content normal.        Judgment: Judgment normal.       Assessment & Plan:  1. Routine general medical examination at a health care facility - Benign exam  - Encouraged moderate weight loss.  - Follow up in one year or sooner if needed - CBC with Differential/Platelet - Comprehensive metabolic panel - Hemoglobin A1c - Lipid panel - TSH  2. Diabetes mellitus type 2 in obese ([place]) - Consider adding agent  - Likely follow up in 6 months  - CBC with Differential/Platelet - Comprehensive metabolic panel - Hemoglobin A1c - Lipid panel - TSH  3. Essential hypertension - Well controlled. No change in medications  - CBC with Differential/Platelet - Comprehensive metabolic panel - Hemoglobin A1c - Lipid panel - TSH  4. Mixed hyperlipidemia - Well controlled.  - CBC with Differential/Platelet - Comprehensive metabolic panel - Hemoglobin A1c - Lipid panel - TSH - simvastatin (ZOCOR) 5 MG tablet; Take 1 tablet (5 mg total) by mouth daily.  Dispense: 90 tablet; Refill: 3  5. Need for hepatitis C screening test  - Hep C Antibody  6. Encounter for screening for HIV  - HIV Antibody (routine testing w rflx)  Dorothyann Peng, NP

## 2018-04-10 LAB — HEPATITIS C ANTIBODY
Hepatitis C Ab: NONREACTIVE
SIGNAL TO CUT-OFF: 0.02 (ref <1.00)

## 2018-04-10 LAB — HIV ANTIBODY (ROUTINE TESTING W REFLEX): HIV 1&2 Ab, 4th Generation: NONREACTIVE

## 2018-04-28 ENCOUNTER — Encounter (HOSPITAL_COMMUNITY): Payer: Self-pay | Admitting: Emergency Medicine

## 2018-04-28 ENCOUNTER — Emergency Department (HOSPITAL_COMMUNITY)
Admission: EM | Admit: 2018-04-28 | Discharge: 2018-04-28 | Disposition: A | Discharge status: Home / Self Care | Payer: BLUE CROSS/BLUE SHIELD | Attending: Emergency Medicine | Admitting: Emergency Medicine

## 2018-04-28 ENCOUNTER — Other Ambulatory Visit: Payer: Self-pay

## 2018-04-28 DIAGNOSIS — Z7984 Long term (current) use of oral hypoglycemic drugs: Secondary | ICD-10-CM | POA: Diagnosis not present

## 2018-04-28 DIAGNOSIS — E119 Type 2 diabetes mellitus without complications: Secondary | ICD-10-CM | POA: Diagnosis not present

## 2018-04-28 DIAGNOSIS — K529 Noninfective gastroenteritis and colitis, unspecified: Secondary | ICD-10-CM

## 2018-04-28 DIAGNOSIS — E876 Hypokalemia: Secondary | ICD-10-CM | POA: Diagnosis not present

## 2018-04-28 DIAGNOSIS — Z79899 Other long term (current) drug therapy: Secondary | ICD-10-CM | POA: Diagnosis not present

## 2018-04-28 DIAGNOSIS — R109 Unspecified abdominal pain: Secondary | ICD-10-CM | POA: Diagnosis not present

## 2018-04-28 DIAGNOSIS — E860 Dehydration: Secondary | ICD-10-CM | POA: Insufficient documentation

## 2018-04-28 DIAGNOSIS — I10 Essential (primary) hypertension: Secondary | ICD-10-CM | POA: Insufficient documentation

## 2018-04-28 DIAGNOSIS — R197 Diarrhea, unspecified: Secondary | ICD-10-CM | POA: Diagnosis not present

## 2018-04-28 LAB — LIPASE, BLOOD: Lipase: 48 U/L (ref 11–51)

## 2018-04-28 LAB — CBC
HCT: 42.6 % (ref 36.0–46.0)
Hemoglobin: 14 g/dL (ref 12.0–15.0)
MCH: 28.5 pg (ref 26.0–34.0)
MCHC: 32.9 g/dL (ref 30.0–36.0)
MCV: 86.6 fL (ref 80.0–100.0)
Platelets: 223 10*3/uL (ref 150–400)
RBC: 4.92 MIL/uL (ref 3.87–5.11)
RDW: 13.1 % (ref 11.5–15.5)
WBC: 14 10*3/uL — ABNORMAL HIGH (ref 4.0–10.5)
nRBC: 0 % (ref 0.0–0.2)

## 2018-04-28 LAB — COMPREHENSIVE METABOLIC PANEL
ALT: 24 U/L (ref 0–44)
AST: 23 U/L (ref 15–41)
Albumin: 4.2 g/dL (ref 3.5–5.0)
Alkaline Phosphatase: 42 U/L (ref 38–126)
Anion gap: 14 (ref 5–15)
BUN: 18 mg/dL (ref 8–23)
CO2: 23 mmol/L (ref 22–32)
Calcium: 9.6 mg/dL (ref 8.9–10.3)
Chloride: 100 mmol/L (ref 98–111)
Creatinine, Ser: 1.04 mg/dL — ABNORMAL HIGH (ref 0.44–1.00)
GFR calc Af Amer: >60 mL/min (ref >60)
GFR calc non Af Amer: 57 mL/min — ABNORMAL LOW (ref >60)
GLUCOSE: 120 mg/dL — AB (ref 70–99)
Potassium: 2.9 mmol/L — ABNORMAL LOW (ref 3.5–5.1)
Sodium: 137 mmol/L (ref 135–145)
Total Bilirubin: 1.4 mg/dL — ABNORMAL HIGH (ref 0.3–1.2)
Total Protein: 7.2 g/dL (ref 6.5–8.1)

## 2018-04-28 MED ORDER — SODIUM CHLORIDE 0.9 % IV BOLUS
2000.0000 mL | Freq: Once | INTRAVENOUS | Status: AC
  Administered 2018-04-28: 2000 mL via INTRAVENOUS

## 2018-04-28 MED ORDER — METOCLOPRAMIDE HCL 10 MG PO TABS: 10.0000 mg | ORAL_TABLET | Freq: Four times a day (QID) | ORAL | 0 refills | Status: DC | PRN

## 2018-04-28 MED ORDER — METOCLOPRAMIDE HCL 5 MG/ML IJ SOLN
5.0000 mg | Freq: Once | INTRAMUSCULAR | Status: AC
  Administered 2018-04-28: 5 mg via INTRAVENOUS
  Filled 2018-04-28: qty 2

## 2018-04-28 MED ORDER — POTASSIUM CHLORIDE CRYS ER 20 MEQ PO TBCR
40.0000 meq | EXTENDED_RELEASE_TABLET | Freq: Once | ORAL | Status: AC
  Administered 2018-04-28: 40 meq via ORAL
  Filled 2018-04-28: qty 2

## 2018-04-28 NOTE — Discharge Instructions (Signed)
Take the medication prescribed as needed for nausea.  Avoid local foods containing milk such as cheese or ice cream while having diarrhea.Make sure that you drink at least six 8 ounce glasses of water or Gatorade each day in order to stay well-hydrated.  See your primary care provider if not continue to improve in 2 to 3 days.  Return to the emergency department if you feel worse or if you continue to vomit after taking the medication prescribed or if concern for any reason

## 2018-04-28 NOTE — ED Triage Notes (Signed)
abd pain n/v /d since Thursday stomach bug going around her work

## 2018-04-28 NOTE — ED Notes (Signed)
Patient verbalizes understanding of discharge instructions. Opportunity for questioning and answers were provided. Armband removed by staff, pt discharged from ED ambulatory.   

## 2018-04-28 NOTE — ED Provider Notes (Signed)
[facility] EMERGENCY DEPARTMENT Provider Note   CSN: [id number] Arrival date & time: 04/28/18  1235     History   Chief Complaint Chief Complaint  Patient presents with  . Abdominal Pain  . Emesis    HPI Amanda Eaton is a 63 y.o. female complaining of intermittent crampy abdominal pain onset 3 days ago.  She has had several episodes of vomiting.  Last vomiting yesterday.  Continues to feel nauseated.  Patient also had diarrhea 3 days ago which has since resolved.  She presents today as she feels dehydrated and becomes lightheaded, worse with standing.  She has been eating and drinking less because of persistent nausea.  She denies cough denies fever.  She states that several other people where she works had "stomach bug" she feels symptoms are similar.  She denies abdominal pain presently.Marland Kitchen  HPI  Past Medical History:  Diagnosis Date  . DIABETES MELLITUS, TYPE II 11/23/2006  . [place], [place] 04/08/2008  . HYPERTENSION 11/23/2006  . WEIGHT GAIN 10/12/2008    Patient Active Problem List   Diagnosis Date Noted  . Acute pain of right knee 03/15/2016  . S/P hysterectomy 07/18/2012  . WEIGHT GAIN 10/12/2008  . Hyperlipidemia 04/08/2008  . Diabetes mellitus type 2 in obese ([place]) 11/23/2006  . Essential hypertension 11/23/2006    Past Surgical History:  Procedure Laterality Date  . ANTERIOR AND POSTERIOR REPAIR N/A 07/17/2012   Procedure: ANTERIOR (CYSTOCELE) AND POSTERIOR REPAIR (RECTOCELE);  Surgeon: Princess Bruins, MD;  Location: [facility];  Service: Gynecology;  Laterality: N/A;  . CATARACT EXTRACTION  2015  . HYSTEROSCOPY W/ ENDOMETRIAL ABLATION     1987  . MYOMECTOMY VAGINAL APPROACH    . ROBOTIC ASSISTED TOTAL HYSTERECTOMY N/A 07/17/2012   Procedure: ROBOTIC ASSISTED TOTAL HYSTERECTOMY with Utero-sacral Ligament Suspension;  Surgeon: Princess Bruins, MD;  Location: [facility];  Service: Gynecology;  Laterality: N/A;     OB History    Gravida    3   Para  3   Term      Preterm      AB      Living  3     SAB      TAB      Ectopic      Multiple      Live Births               Home Medications    Prior to Admission medications   Medication Sig Start Date End Date Taking? Authorizing Provider  ibuprofen (ADVIL,MOTRIN) 200 MG tablet Take 400 mg by mouth every 6 (six) hours as needed for moderate pain. Reported on 05/10/2015    [provider]  JANUMET XR [phone] MG TB24 TAKE 1 TABLET DAILY 02/27/18   Nafziger, Tommi Rumps, NP  lisinopril-hydrochlorothiazide (PRINZIDE,ZESTORETIC) 10-12.5 MG tablet Take 1 tablet daily by mouth. Patient taking differently: Take 0.5 tablets by mouth daily.  03/06/17   Nafziger, Tommi Rumps, NP  Multiple Vitamins-Minerals (WOMENS MULTI VITAMIN & MINERAL PO) Take by mouth.    [provider]  simvastatin (ZOCOR) 5 MG tablet Take 1 tablet (5 mg total) by mouth daily. 04/09/18   Dorothyann Peng, NP  TURMERIC PO Take by mouth.    [provider]    Family History Family History  Problem Relation Age of Onset  . Diabetes Mother   . Hyperlipidemia Mother   . Hypertension Mother   . COPD Father   . Lung cancer Brother  lung  . Diabetes Maternal Grandfather   . Colon cancer Neg Hx     Social History Social History   Tobacco Use  . Smoking status: Never Smoker  . Smokeless tobacco: Never Used  Substance Use Topics  . Alcohol use: No    Alcohol/week: 0.0 standard drinks  . Drug use: No     Allergies   Patient has no known allergies.   Review of Systems Review of Systems  Constitutional: Negative.   HENT: Negative.   Respiratory: Negative.   Cardiovascular: Negative.   Gastrointestinal: Positive for abdominal pain, diarrhea, nausea and vomiting.  Musculoskeletal: Negative.   Skin: Negative.   Allergic/Immunologic: Positive for immunocompromised state.       Diabetic  Neurological: Positive for light-headedness.  Psychiatric/Behavioral: Negative.       Physical Exam Updated Vital Signs BP 117/81   Pulse 78   Temp 99.1 F (37.3 C) (Oral)   Resp 20   SpO2 100%   Physical Exam Vitals signs and nursing note reviewed.  Constitutional:      Appearance: She is well-developed. She is not ill-appearing.     Comments: Mucous membranes dry  HENT:     Head: Normocephalic and atraumatic.  Eyes:     Conjunctiva/sclera: Conjunctivae normal.     Pupils: Pupils are equal, round, and reactive to light.  Neck:     Musculoskeletal: Neck supple.     Thyroid: No thyromegaly.     Trachea: No tracheal deviation.  Cardiovascular:     Rate and Rhythm: Normal rate and regular rhythm.     Heart sounds: No murmur.  Pulmonary:     Effort: Pulmonary effort is normal.     Breath sounds: Normal breath sounds.  Abdominal:     General: Bowel sounds are normal. There is no distension.     Palpations: Abdomen is soft.     Tenderness: There is no abdominal tenderness.  Musculoskeletal: Normal range of motion.        General: No tenderness.  Skin:    General: Skin is warm and dry.     Findings: No rash.  Neurological:     Mental Status: She is alert.     Coordination: Coordination normal.      ED Treatments / Results  Labs (all labs ordered are listed, but only abnormal results are displayed) Labs Reviewed  COMPREHENSIVE METABOLIC PANEL - Abnormal; Notable for the following components:      Result Value   Potassium 2.9 (*)    Glucose, Bld 120 (*)    Creatinine, Ser 1.04 (*)    Total Bilirubin 1.4 (*)    GFR calc non Af Amer 57 (*)    All other components within normal limits  CBC - Abnormal; Notable for the following components:   WBC 14.0 (*)    All other components within normal limits  LIPASE, BLOOD    EKG None  Radiology No results found.  Procedures Procedures (including critical care time)  Medications Ordered in ED Medications  sodium chloride 0.9 % bolus 2,000 mL (has no administration in time range)   metoCLOPramide (REGLAN) injection 5 mg (has no administration in time range)  potassium chloride SA (K-DUR,KLOR-CON) CR tablet 40 mEq (has no administration in time range)   Results for orders placed or performed during the hospital encounter of 04/28/18  Lipase, blood  Result Value Ref Range   Lipase 48 11 - 51 U/L  Comprehensive metabolic panel  Result Value Ref Range  Sodium 137 135 - 145 mmol/L   Potassium 2.9 (L) 3.5 - 5.1 mmol/L   Chloride 100 98 - 111 mmol/L   CO2 23 22 - 32 mmol/L   Glucose, Bld 120 (H) 70 - 99 mg/dL   BUN 18 8 - 23 mg/dL   Creatinine, Ser 1.04 (H) 0.44 - 1.00 mg/dL   Calcium 9.6 8.9 - 10.3 mg/dL   Total Protein 7.2 6.5 - 8.1 g/dL   Albumin 4.2 3.5 - 5.0 g/dL   AST 23 15 - 41 U/L   ALT 24 0 - 44 U/L   Alkaline Phosphatase 42 38 - 126 U/L   Total Bilirubin 1.4 (H) 0.3 - 1.2 mg/dL   GFR calc non Af Amer 57 (L) >60 mL/min   GFR calc Af Amer >60 >60 mL/min   Anion gap 14 5 - 15  CBC  Result Value Ref Range   WBC 14.0 (H) 4.0 - 10.5 K/uL   RBC 4.92 3.87 - 5.11 MIL/uL   Hemoglobin 14.0 12.0 - 15.0 g/dL   HCT 42.6 36.0 - 46.0 %   MCV 86.6 80.0 - 100.0 fL   MCH 28.5 26.0 - 34.0 pg   MCHC 32.9 30.0 - 36.0 g/dL   RDW 13.1 11.5 - 15.5 %   Platelets 223 150 - 400 K/uL   nRBC 0.0 0.0 - 0.2 %   No results found.  Initial Impression / Assessment and Plan / ED Course  I have reviewed the triage vital signs and the nursing notes.  Pertinent labs & imaging results that were available during my care of the patient were reviewed by me and considered in my medical decision making (see chart for details).     4:30 PM feels much improved after treatment with intravenous hydration with normal saline and IV Reglan.  She is able to drink without nausea.  She is not lightheaded on standing.  She received oral potassium supplementation Lab work consistent with mild hypokalemia and dehydration.  Symptoms consistent with gastroenteritis encourage oral hydration.   Prescription Reglan.  Avoid dairy.  Follow-up with PMD if not continue to feel improved in 2 or 3 days Final Clinical Impressions(s) / ED Diagnoses  #1 gastroenteritis #2 mild dehydration #3 hypokalemia Final diagnoses:  None    ED Discharge Orders    None       Orlie Dakin, MD 04/28/18 1644

## 2018-04-29 ENCOUNTER — Ambulatory Visit: Payer: Self-pay

## 2018-04-29 NOTE — Telephone Encounter (Signed)
Pt husband called to say his wife was seen in the ED yesterday after a stomach virus made her weak.Amanda Eaton She was given IV fluids and nausea medication and sent home. No vomiting today. No diarrhea. Today her husband states that she has no apitite and is not drinking enough. Pt states that she is voiding. Two times through the night and once this AM. It is clear with no color Pt is diabetic and BS 146 but has not taken her medication yet today. Pt is walking but just feels weak and wants to stay in bed. Pt husband given suggestions for hydration and told that if his wife stopped urinating or would not drink he need to call back or go to urgent care. Husband agreed. Care advice read to husband to include if symptoms worsen please go to urgent care or ED. Reason for Disposition . Poor fluid intake probably caused the weakness  Answer Assessment - Initial Assessment Questions 1. DESCRIPTION: "Describe how you are feeling."     Weak, no appitite 2. SEVERITY: "How bad is it?"  "Can you stand and walk?"   - MILD - Feels weak or tired, but does not interfere with work, school or normal activities   - [place] to stand and walk; weakness interferes with work, school, or normal activities   - SEVERE - Unable to stand or walk     moderate 3. ONSET:  "When did the weakness begin?"     thursday 4. CAUSE: "What do you think is causing the weakness?"     Vomiting and dehydration 5. MEDICINES: "Have you recently started a new medicine or had a change in the amount of a medicine?"     No 6. OTHER SYMPTOMS: "Do you have any other symptoms?" (e.g., chest pain, fever, cough, SOB, vomiting, diarrhea, bleeding, other areas of pain)     No 7. PREGNANCY: "Is there any chance you are pregnant?" "When was your last menstrual period?"     N/A  Protocols used: WEAKNESS (GENERALIZED) AND FATIGUE-A-AH

## 2018-04-30 NOTE — Telephone Encounter (Signed)
Noted  

## 2018-07-17 ENCOUNTER — Other Ambulatory Visit: Payer: Self-pay | Admitting: Adult Health

## 2018-07-17 DIAGNOSIS — I10 Essential (primary) hypertension: Secondary | ICD-10-CM

## 2018-07-18 NOTE — Telephone Encounter (Signed)
Sent to the pharmacy by e-scribe. 

## 2018-07-19 ENCOUNTER — Encounter: Payer: BLUE CROSS/BLUE SHIELD | Admitting: Obstetrics & Gynecology

## 2018-08-30 ENCOUNTER — Other Ambulatory Visit: Payer: Self-pay

## 2018-09-02 ENCOUNTER — Other Ambulatory Visit: Payer: Self-pay

## 2018-09-02 ENCOUNTER — Encounter: Payer: Self-pay | Admitting: Obstetrics & Gynecology

## 2018-09-02 ENCOUNTER — Ambulatory Visit: Payer: BLUE CROSS/BLUE SHIELD | Admitting: Obstetrics & Gynecology

## 2018-09-02 VITALS — BP 150/100 | Ht 65.0 in | Wt 183.0 lb

## 2018-09-02 DIAGNOSIS — Z13820 Encounter for screening for osteoporosis: Secondary | ICD-10-CM

## 2018-09-02 DIAGNOSIS — E6609 Other obesity due to excess calories: Secondary | ICD-10-CM

## 2018-09-02 DIAGNOSIS — Z90710 Acquired absence of both cervix and uterus: Secondary | ICD-10-CM | POA: Diagnosis not present

## 2018-09-02 DIAGNOSIS — Z780 Asymptomatic menopausal state: Secondary | ICD-10-CM | POA: Diagnosis not present

## 2018-09-02 DIAGNOSIS — Z6830 Body mass index (BMI) 30.0-30.9, adult: Secondary | ICD-10-CM

## 2018-09-02 DIAGNOSIS — Z01419 Encounter for gynecological examination (general) (routine) without abnormal findings: Secondary | ICD-10-CM | POA: Diagnosis not present

## 2018-09-02 NOTE — Progress Notes (Signed)
Amanda Eaton 11/23/1955 [id number]   History:    63 y.o. G3P3L3 Married.  4 grand-children  RP:  Established patient presenting for annual gyn exam   HPI: S/P Total Hysterectomy.  Menopause, well on no HRT.  No pelvic pain.  Urine/BMs normal.  Breasts normal.  BMI 30.45.  Health labs with Fam MD.  DM on Janumet.  cHTN on Lisinopril/HCT.  Hypercholesterolemia on Zocor.  Past medical history,surgical history, family history and social history were all reviewed and documented in the EPIC chart.  Gynecologic History No LMP recorded. Patient has had a hysterectomy. Contraception: status post hysterectomy Last Pap: 06/2016. Results were: Negative Last mammogram: 06/2017.  Results were: Negative Bone Density: Will schedule now Colonoscopy: 2017  Obstetric History OB History  Gravida Para Term Preterm AB Living  3 3       3   SAB TAB Ectopic Multiple Live Births               # Outcome Date GA Lbr Len/2nd Weight Sex Delivery Anes PTL Lv  3 Para           2 Para           1 Para              ROS: A ROS was performed and pertinent positives and negatives are included in the history.  GENERAL: No fevers or chills. HEENT: No change in vision, no earache, sore throat or sinus congestion. NECK: No pain or stiffness. CARDIOVASCULAR: No chest pain or pressure. No palpitations. PULMONARY: No shortness of breath, cough or wheeze. GASTROINTESTINAL: No abdominal pain, nausea, vomiting or diarrhea, melena or bright red blood per rectum. GENITOURINARY: No urinary frequency, urgency, hesitancy or dysuria. MUSCULOSKELETAL: No joint or muscle pain, no back pain, no recent trauma. DERMATOLOGIC: No rash, no itching, no lesions. ENDOCRINE: No polyuria, polydipsia, no heat or cold intolerance. No recent change in weight. HEMATOLOGICAL: No anemia or easy bruising or bleeding. NEUROLOGIC: No headache, seizures, numbness, tingling or weakness. PSYCHIATRIC: No depression, no loss of interest in normal activity or  change in sleep pattern.     Exam:   BP (!) 150/92   Ht 5\' 5"  (1.651 m)   Wt 183 lb (83 kg)   BMI 30.45 kg/m   Body mass index is 30.45 kg/m.  General appearance : Well developed well nourished female. No acute distress HEENT: Eyes: no retinal hemorrhage or exudates,  Neck supple, trachea midline, no carotid bruits, no thyroidmegaly Lungs: Clear to auscultation, no rhonchi or wheezes, or rib retractions  Heart: Regular rate and rhythm, no murmurs or gallops Breast:Examined in sitting and supine position were symmetrical in appearance, no palpable masses or tenderness,  no skin retraction, no nipple inversion, no nipple discharge, no skin discoloration, no axillary or supraclavicular lymphadenopathy Abdomen: no palpable masses or tenderness, no rebound or guarding Extremities: no edema or skin discoloration or tenderness  Pelvic: Vulva: Normal             Vagina: No gross lesions or discharge  Cervix/Uterus absent  Adnexa  Without masses or tenderness  Anus: Normal   Assessment/Plan:  63 y.o. female for annual exam   1. Well female exam with routine gynecological exam Gynecologic exam status post total hysterectomy in menopause.  Last Pap test March 2018 was negative, no indication to repeat a Pap test at this time.  Breast exam normal.  Screening mammogram March 2019 was negative, patient will schedule a repeat screening mammogram  now.  Health labs with family nurse practitioner colonoscopy in 2017.Marland Kitchen  Referred to [facility] for full skin exam.  2. S/P total hysterectomy  3. Postmenopause Well on no hormone replacement therapy.  4. Screening for osteoporosis Schedule bone density here now.  Vitamin D supplements, calcium intake of 1200 to 1500 mg daily and regular weightbearing physical activities. - DG Bone Density; Future  5. Class 1 obesity due to excess calories with serious comorbidity with body mass index (BMI) of 30.0 to 30.9 in adult Recommend a lower calorie/carb  diet such as [employer] and aerobic physical activities 5 times a week with weight lifting every 2 days.  Princess Bruins MD, 1:27 PM 09/02/2018

## 2018-09-03 ENCOUNTER — Other Ambulatory Visit: Payer: Self-pay | Admitting: Adult Health

## 2018-09-03 ENCOUNTER — Encounter: Payer: Self-pay | Admitting: Obstetrics & Gynecology

## 2018-09-03 DIAGNOSIS — H35372 Puckering of macula, left eye: Secondary | ICD-10-CM | POA: Diagnosis not present

## 2018-09-03 DIAGNOSIS — E1169 Type 2 diabetes mellitus with other specified complication: Secondary | ICD-10-CM

## 2018-09-03 DIAGNOSIS — E119 Type 2 diabetes mellitus without complications: Secondary | ICD-10-CM | POA: Diagnosis not present

## 2018-09-03 DIAGNOSIS — E669 Obesity, unspecified: Secondary | ICD-10-CM

## 2018-09-03 DIAGNOSIS — H33312 Horseshoe tear of retina without detachment, left eye: Secondary | ICD-10-CM | POA: Diagnosis not present

## 2018-09-03 DIAGNOSIS — H26492 Other secondary cataract, left eye: Secondary | ICD-10-CM | POA: Diagnosis not present

## 2018-09-03 LAB — HM DIABETES EYE EXAM

## 2018-09-03 NOTE — Patient Instructions (Signed)
1. Well female exam with routine gynecological exam Gynecologic exam status post total hysterectomy in menopause.  Last Pap test March 2018 was negative, no indication to repeat a Pap test at this time.  Breast exam normal.  Screening mammogram March 2019 was negative, patient will schedule a repeat screening mammogram now.  Health labs with family nurse practitioner colonoscopy in 2017.Marland Kitchen  Referred to [facility] for full skin exam.  2. S/P total hysterectomy  3. Postmenopause Well on no hormone replacement therapy.  4. Screening for osteoporosis Schedule bone density here now.  Vitamin D supplements, calcium intake of 1200 to 1500 mg daily and regular weightbearing physical activities. - DG Bone Density; Future  5. Class 1 obesity due to excess calories with serious comorbidity with body mass index (BMI) of 30.0 to 30.9 in adult Recommend a lower calorie/carb diet such as [employer] and aerobic physical activities 5 times a week with weight lifting every 2 days.  Amanda Eaton, it was a pleasure seeing you today!

## 2018-09-04 ENCOUNTER — Other Ambulatory Visit: Payer: Self-pay | Admitting: Family Medicine

## 2018-09-04 DIAGNOSIS — E1169 Type 2 diabetes mellitus with other specified complication: Secondary | ICD-10-CM

## 2018-09-04 DIAGNOSIS — E669 Obesity, unspecified: Secondary | ICD-10-CM

## 2018-09-04 NOTE — Telephone Encounter (Signed)
Pt now scheduled to see Tommi Rumps and have a1c.

## 2018-09-05 ENCOUNTER — Other Ambulatory Visit: Payer: Self-pay

## 2018-09-05 ENCOUNTER — Other Ambulatory Visit (INDEPENDENT_AMBULATORY_CARE_PROVIDER_SITE_OTHER): Payer: BLUE CROSS/BLUE SHIELD

## 2018-09-05 DIAGNOSIS — E669 Obesity, unspecified: Secondary | ICD-10-CM

## 2018-09-05 DIAGNOSIS — E1169 Type 2 diabetes mellitus with other specified complication: Secondary | ICD-10-CM

## 2018-09-05 LAB — HEMOGLOBIN A1C: Hgb A1c MFr Bld: 6.1 % (ref 4.6–6.5)

## 2018-09-06 ENCOUNTER — Other Ambulatory Visit: Payer: Self-pay

## 2018-09-06 ENCOUNTER — Ambulatory Visit (INDEPENDENT_AMBULATORY_CARE_PROVIDER_SITE_OTHER): Payer: BLUE CROSS/BLUE SHIELD | Admitting: Adult Health

## 2018-09-06 ENCOUNTER — Encounter: Payer: Self-pay | Admitting: Adult Health

## 2018-09-06 DIAGNOSIS — E1169 Type 2 diabetes mellitus with other specified complication: Secondary | ICD-10-CM

## 2018-09-06 DIAGNOSIS — E669 Obesity, unspecified: Secondary | ICD-10-CM | POA: Diagnosis not present

## 2018-09-06 DIAGNOSIS — Z1231 Encounter for screening mammogram for malignant neoplasm of breast: Secondary | ICD-10-CM | POA: Diagnosis not present

## 2018-09-06 MED ORDER — SITAGLIP PHOS-METFORMIN HCL ER 100-1000 MG PO TB24: 1.0000 | ORAL_TABLET | Freq: Every day | ORAL | 1 refills | Status: DC

## 2018-09-06 NOTE — Progress Notes (Signed)
Virtual Visit via Video Note  I connected with Amanda Eaton  on 09/06/18 at  1:00 PM EDT by a video enabled telemedicine application and verified that I am speaking with the correct person using two identifiers.  Location patient: home Location provider:work or home office Persons participating in the virtual visit: patient, provider  I discussed the limitations of evaluation and management by telemedicine and the availability of in person appointments. The patient expressed understanding and agreed to proceed.   HPI: 63 year old female who is being evaluated today for follow-up regarding diabetes mellitus.  He has been well controlled in the past.  She does periodically monitor her blood sugars at home and reports readings between 90 and 110.  Regimen includes Janumet [phone] mg daily.  Her last A1c in December 2019 was 6.1.  She reports that she has been snacking more that she is quarantined but she is also trying to exercise. She has not had any instances of hypoglycemia.   She was seen by her eye doctor this week and had no changes.    She denies any acute issues.    ROS: See pertinent positives and negatives per HPI.  Past Medical History:  Diagnosis Date  . DIABETES MELLITUS, TYPE II 11/23/2006  . [place], [place] 04/08/2008  . HYPERTENSION 11/23/2006  . WEIGHT GAIN 10/12/2008    Past Surgical History:  Procedure Laterality Date  . ANTERIOR AND POSTERIOR REPAIR N/A 07/17/2012   Procedure: ANTERIOR (CYSTOCELE) AND POSTERIOR REPAIR (RECTOCELE);  Surgeon: Princess Bruins, MD;  Location: [facility];  Service: Gynecology;  Laterality: N/A;  . CATARACT EXTRACTION  2015  . HYSTEROSCOPY W/ ENDOMETRIAL ABLATION     1987  . MYOMECTOMY VAGINAL APPROACH    . ROBOTIC ASSISTED TOTAL HYSTERECTOMY N/A 07/17/2012   Procedure: ROBOTIC ASSISTED TOTAL HYSTERECTOMY with Utero-sacral Ligament Suspension;  Surgeon: Princess Bruins, MD;  Location: [facility];  Service: Gynecology;  Laterality:  N/A;    Family History  Problem Relation Age of Onset  . Diabetes Mother   . Hyperlipidemia Mother   . Hypertension Mother   . COPD Father   . Lung cancer Brother        lung  . Diabetes Maternal Grandfather   . Colon cancer Neg Hx      Current Outpatient Medications:  .  ibuprofen (ADVIL,MOTRIN) 200 MG tablet, Take 400 mg by mouth every 6 (six) hours as needed for moderate Eaton. , Disp: , Rfl:  .  lisinopril-hydrochlorothiazide (PRINZIDE,ZESTORETIC) 10-12.5 MG tablet, Take 0.5 tablets by mouth daily., Disp: 45 tablet, Rfl: 2 .  metoCLOPramide (REGLAN) 10 MG tablet, Take 1 tablet (10 mg total) by mouth every 6 (six) hours as needed for nausea or vomiting., Disp: 6 tablet, Rfl: 0 .  Multiple Vitamins-Minerals (WOMENS MULTI VITAMIN & MINERAL PO), Take 1 tablet by mouth 2 (two) times daily. , Disp: , Rfl:  .  simvastatin (ZOCOR) 5 MG tablet, Take 1 tablet (5 mg total) by mouth daily., Disp: 90 tablet, Rfl: 3 .  SitaGLIPtin-MetFORMIN HCl (JANUMET XR) [phone] MG TB24, Take 1 tablet by mouth daily., Disp: 90 tablet, Rfl: 1 .  TURMERIC PO, Take 3 tablets by mouth 3 (three) times daily. , Disp: , Rfl:   EXAM:  VITALS per patient if applicable:  GENERAL: alert, oriented, appears well and in no acute distress  HEENT: atraumatic, conjunttiva clear, no obvious abnormalities on inspection of external nose and ears  NECK: normal movements of the head and neck  LUNGS: on inspection no signs  of respiratory distress, breathing rate appears normal, no obvious gross SOB, gasping or wheezing  CV: no obvious cyanosis  MS: moves all visible extremities without noticeable abnormality  PSYCH/NEURO: pleasant and cooperative, no obvious depression or anxiety, speech and thought processing grossly intact  ASSESSMENT AND PLAN:  Discussed the following assessment and plan:  A1c - 6.1- has not changed. Continue with lifestyle modifications and follow up in 6 months or sooner if needed  Diabetes  mellitus type 2 in obese ([place]) - Plan: SitaGLIPtin-MetFORMIN HCl (JANUMET XR) [phone] MG TB24     I discussed the assessment and treatment plan with the patient. The patient was provided an opportunity to ask questions and all were answered. The patient agreed with the plan and demonstrated an understanding of the instructions.   The patient was advised to call back or seek an in-person evaluation if the symptoms worsen or if the condition fails to improve as anticipated.   Dorothyann Peng, NP

## 2018-09-11 ENCOUNTER — Telehealth: Payer: Self-pay | Admitting: *Deleted

## 2018-09-11 NOTE — Telephone Encounter (Signed)
-----   Message from Princess Bruins, MD sent at 09/02/2018  1:41 PM EDT ----- Regarding: Refer to [facility].  Needs a full skin exam.

## 2018-09-11 NOTE — Telephone Encounter (Signed)
Patient scheduled on 10/03/18 @ 2:00pm at [facility] dermatology with Casimiro Needle, notes faxed to [phone]. Patient informed.

## 2018-09-17 ENCOUNTER — Telehealth: Payer: Self-pay | Admitting: Adult Health

## 2018-09-17 DIAGNOSIS — E1169 Type 2 diabetes mellitus with other specified complication: Secondary | ICD-10-CM

## 2018-09-17 MED ORDER — SITAGLIP PHOS-METFORMIN HCL ER 100-1000 MG PO TB24: 1.0000 | ORAL_TABLET | Freq: Every day | ORAL | 1 refills | Status: DC

## 2018-09-17 NOTE — Telephone Encounter (Signed)
Last sent to [facility] on [address].  Called and cancelled the prescriptions.  Pt had not picked up.  Sent to [facility] order.  Nothing further needed.

## 2018-09-17 NOTE — Telephone Encounter (Signed)
Copied from [place] [id number]. Topic: Quick Communication - Rx Refill/Question >> Sep 17, 2018 10:02 AM Marin Olp L wrote: Medication: SitaGLIPtin-MetFORMIN HCl (JANUMET XR) [phone] MG TB24 (sent to wrong pharmacy)  Has the patient contacted their pharmacy? {yes  (Agent: If no, request that the patient contact the pharmacy for the refill.) (Agent: If yes, when and what did the pharmacy advise?)  Preferred Pharmacy (with phone number or street name): [facility] [address] [place] [address] Phone: [phone] Fax: [phone]  Agent: Please be advised that RX refills may take up to 3 business days. We ask that you follow-up with your pharmacy.

## 2018-10-03 ENCOUNTER — Other Ambulatory Visit: Payer: Self-pay

## 2018-10-03 DIAGNOSIS — D485 Neoplasm of uncertain behavior of skin: Secondary | ICD-10-CM | POA: Diagnosis not present

## 2018-10-03 DIAGNOSIS — D229 Melanocytic nevi, unspecified: Secondary | ICD-10-CM | POA: Diagnosis not present

## 2018-10-31 ENCOUNTER — Encounter: Payer: Self-pay | Admitting: Family Medicine

## 2018-11-14 ENCOUNTER — Other Ambulatory Visit: Payer: Self-pay

## 2018-11-14 DIAGNOSIS — D235 Other benign neoplasm of skin of trunk: Secondary | ICD-10-CM | POA: Diagnosis not present

## 2019-02-21 ENCOUNTER — Other Ambulatory Visit: Payer: Self-pay | Admitting: Adult Health

## 2019-02-21 DIAGNOSIS — E669 Obesity, unspecified: Secondary | ICD-10-CM

## 2019-02-21 DIAGNOSIS — E1169 Type 2 diabetes mellitus with other specified complication: Secondary | ICD-10-CM

## 2019-02-25 NOTE — Telephone Encounter (Signed)
Sent to the pharmacy by e-scribe.  Pt scheduled for follow up.

## 2019-03-05 ENCOUNTER — Other Ambulatory Visit (INDEPENDENT_AMBULATORY_CARE_PROVIDER_SITE_OTHER): Payer: BC Managed Care – PPO

## 2019-03-05 ENCOUNTER — Other Ambulatory Visit: Payer: Self-pay

## 2019-03-05 DIAGNOSIS — E1169 Type 2 diabetes mellitus with other specified complication: Secondary | ICD-10-CM

## 2019-03-05 DIAGNOSIS — E669 Obesity, unspecified: Secondary | ICD-10-CM

## 2019-03-05 LAB — HEMOGLOBIN A1C: Hgb A1c MFr Bld: 6.1 % (ref 4.6–6.5)

## 2019-03-06 ENCOUNTER — Telehealth (INDEPENDENT_AMBULATORY_CARE_PROVIDER_SITE_OTHER): Payer: BC Managed Care – PPO | Admitting: Adult Health

## 2019-03-06 DIAGNOSIS — E118 Type 2 diabetes mellitus with unspecified complications: Secondary | ICD-10-CM

## 2019-03-06 NOTE — Progress Notes (Signed)
Virtual Visit via Video Note  I connected with Amanda Eaton  on 03/06/19 at  4:00 PM EST by a video enabled telemedicine application and verified that I am speaking with the correct person using two identifiers.  Location patient: home Location provider:work or home office Persons participating in the virtual visit: patient, provider  I discussed the limitations of evaluation and management by telemedicine and the availability of in person appointments. The patient expressed understanding and agreed to proceed.   HPI: 63 year old female who is being evaluated today for 39-month follow-up regarding diabetes mellitus.  She has been very well controlled on Janumet Exar [phone] mg.  He has been monitoring her blood sugars at home and reports readings between 90 and 110.  She denies hypoglycemic episodes.  She does report that her diet has not been as healthy as it has in the past.  Her 60 year old daughter has been battling ovarian cancer and finished with chemo recently.  They are waiting on results of her PET scan.  He has been doing more stress eating.   ROS: See pertinent positives and negatives per HPI.  Past Medical History:  Diagnosis Date  . DIABETES MELLITUS, TYPE II 11/23/2006  . [place], [place] 04/08/2008  . HYPERTENSION 11/23/2006  . WEIGHT GAIN 10/12/2008    Past Surgical History:  Procedure Laterality Date  . ANTERIOR AND POSTERIOR REPAIR N/A 07/17/2012   Procedure: ANTERIOR (CYSTOCELE) AND POSTERIOR REPAIR (RECTOCELE);  Surgeon: Princess Bruins, MD;  Location: [facility];  Service: Gynecology;  Laterality: N/A;  . CATARACT EXTRACTION  2015  . HYSTEROSCOPY W/ ENDOMETRIAL ABLATION     1987  . MYOMECTOMY VAGINAL APPROACH    . ROBOTIC ASSISTED TOTAL HYSTERECTOMY N/A 07/17/2012   Procedure: ROBOTIC ASSISTED TOTAL HYSTERECTOMY with Utero-sacral Ligament Suspension;  Surgeon: Princess Bruins, MD;  Location: [facility];  Service: Gynecology;  Laterality: N/A;    Family History   Problem Relation Age of Onset  . Diabetes Mother   . Hyperlipidemia Mother   . Hypertension Mother   . COPD Father   . Lung cancer Brother        lung  . Diabetes Maternal Grandfather   . Colon cancer Neg Hx       Current Outpatient Medications:  .  ibuprofen (ADVIL,MOTRIN) 200 MG tablet, Take 400 mg by mouth every 6 (six) hours as needed for moderate pain. , Disp: , Rfl:  .  JANUMET XR [phone] MG TB24, TAKE 1 TABLET DAILY, Disp: 90 tablet, Rfl: 0 .  lisinopril-hydrochlorothiazide (PRINZIDE,ZESTORETIC) 10-12.5 MG tablet, Take 0.5 tablets by mouth daily., Disp: 45 tablet, Rfl: 2 .  metoCLOPramide (REGLAN) 10 MG tablet, Take 1 tablet (10 mg total) by mouth every 6 (six) hours as needed for nausea or vomiting., Disp: 6 tablet, Rfl: 0 .  Multiple Vitamins-Minerals (WOMENS MULTI VITAMIN & MINERAL PO), Take 1 tablet by mouth 2 (two) times daily. , Disp: , Rfl:  .  simvastatin (ZOCOR) 5 MG tablet, Take 1 tablet (5 mg total) by mouth daily., Disp: 90 tablet, Rfl: 3 .  TURMERIC PO, Take 3 tablets by mouth 3 (three) times daily. , Disp: , Rfl:   EXAM:  VITALS per patient if applicable:  GENERAL: alert, oriented, appears well and in no acute distress  HEENT: atraumatic, conjunttiva clear, no obvious abnormalities on inspection of external nose and ears  NECK: normal movements of the head and neck  LUNGS: on inspection no signs of respiratory distress, breathing rate appears normal, no obvious gross SOB, gasping  or wheezing  CV: no obvious cyanosis  MS: moves all visible extremities without noticeable abnormality  PSYCH/NEURO: pleasant and cooperative, no obvious depression or anxiety, speech and thought processing grossly intact  ASSESSMENT AND PLAN:  Discussed the following assessment and plan:  1. Controlled type 2 diabetes mellitus with complication, without long-term current use of insulin (HCC) -A1c 6.1, this is no change over the last 6 months.  Continue with current  medication therapy - Work on lifestyle modifications      I discussed the assessment and treatment plan with the patient. The patient was provided an opportunity to ask questions and all were answered. The patient agreed with the plan and demonstrated an understanding of the instructions.   The patient was advised to call back or seek an in-person evaluation if the symptoms worsen or if the condition fails to improve as anticipated.   Dorothyann Peng, NP

## 2019-04-21 ENCOUNTER — Other Ambulatory Visit: Payer: Self-pay | Admitting: Adult Health

## 2019-04-21 DIAGNOSIS — E782 Mixed hyperlipidemia: Secondary | ICD-10-CM

## 2019-04-21 DIAGNOSIS — E1169 Type 2 diabetes mellitus with other specified complication: Secondary | ICD-10-CM

## 2019-04-21 DIAGNOSIS — E669 Obesity, unspecified: Secondary | ICD-10-CM

## 2019-04-21 DIAGNOSIS — I10 Essential (primary) hypertension: Secondary | ICD-10-CM

## 2019-04-24 DIAGNOSIS — U071 COVID-19: Secondary | ICD-10-CM | POA: Diagnosis not present

## 2019-09-01 ENCOUNTER — Other Ambulatory Visit: Payer: Self-pay

## 2019-09-02 ENCOUNTER — Other Ambulatory Visit: Payer: Self-pay

## 2019-09-03 ENCOUNTER — Encounter: Payer: Self-pay | Admitting: Obstetrics & Gynecology

## 2019-09-03 ENCOUNTER — Ambulatory Visit (INDEPENDENT_AMBULATORY_CARE_PROVIDER_SITE_OTHER): Payer: BC Managed Care – PPO | Admitting: Obstetrics & Gynecology

## 2019-09-03 VITALS — BP 130/80 | Ht 66.0 in | Wt 193.0 lb

## 2019-09-03 DIAGNOSIS — Z90710 Acquired absence of both cervix and uterus: Secondary | ICD-10-CM

## 2019-09-03 DIAGNOSIS — Z13820 Encounter for screening for osteoporosis: Secondary | ICD-10-CM

## 2019-09-03 DIAGNOSIS — Z01419 Encounter for gynecological examination (general) (routine) without abnormal findings: Secondary | ICD-10-CM

## 2019-09-03 DIAGNOSIS — Z780 Asymptomatic menopausal state: Secondary | ICD-10-CM | POA: Diagnosis not present

## 2019-09-03 DIAGNOSIS — E6609 Other obesity due to excess calories: Secondary | ICD-10-CM

## 2019-09-03 DIAGNOSIS — Z6831 Body mass index (BMI) 31.0-31.9, adult: Secondary | ICD-10-CM

## 2019-09-03 NOTE — Progress Notes (Signed)
Amanda Eaton 1955/06/16 [id number]   History:    64 y.o.  G3P3L3 Married.  4 grand-children  RP:  Established patient presenting for annual gyn exam   HPI: S/P Total Hysterectomy.  Menopause, well on no HRT.  No pelvic pain. No pain with IC. Urine/BMs normal.  Breasts normal.  BMI 31.15.  Health labs with Fam MD.  DM on Janumet.  cHTN on Lisinopril/HCT.  Hypercholesterolemia on Zocor.   Past medical history,surgical history, family history and social history were all reviewed and documented in the EPIC chart.  Gynecologic History No LMP recorded. Patient has had a hysterectomy.   Obstetric History OB History  Gravida Para Term Preterm AB Living  3 3 3     3   SAB TAB Ectopic Multiple Live Births               # Outcome Date GA Lbr Len/2nd Weight Sex Delivery Anes PTL Lv  3 Term           2 Term           1 Term              ROS: A ROS was performed and pertinent positives and negatives are included in the history.  GENERAL: No fevers or chills. HEENT: No change in vision, no earache, sore throat or sinus congestion. NECK: No pain or stiffness. CARDIOVASCULAR: No chest pain or pressure. No palpitations. PULMONARY: No shortness of breath, cough or wheeze. GASTROINTESTINAL: No abdominal pain, nausea, vomiting or diarrhea, melena or bright red blood per rectum. GENITOURINARY: No urinary frequency, urgency, hesitancy or dysuria. MUSCULOSKELETAL: No joint or muscle pain, no back pain, no recent trauma. DERMATOLOGIC: No rash, no itching, no lesions. ENDOCRINE: No polyuria, polydipsia, no heat or cold intolerance. No recent change in weight. HEMATOLOGICAL: No anemia or easy bruising or bleeding. NEUROLOGIC: No headache, seizures, numbness, tingling or weakness. PSYCHIATRIC: No depression, no loss of interest in normal activity or change in sleep pattern.     Exam:   BP 130/80 (BP Location: Right Arm, Patient Position: Sitting, Cuff Size: Normal)   Ht 5\' 6"  (1.676 m)   Wt 193 lb  (87.5 kg)   BMI 31.15 kg/m   Body mass index is 31.15 kg/m.  General appearance : Well developed well nourished female. No acute distress HEENT: Eyes: no retinal hemorrhage or exudates,  Neck supple, trachea midline, no carotid bruits, no thyroidmegaly Lungs: Clear to auscultation, no rhonchi or wheezes, or rib retractions  Heart: Regular rate and rhythm, no murmurs or gallops Breast:Examined in sitting and supine position were symmetrical in appearance, no palpable masses or tenderness,  no skin retraction, no nipple inversion, no nipple discharge, no skin discoloration, no axillary or supraclavicular lymphadenopathy Abdomen: no palpable masses or tenderness, no rebound or guarding Extremities: no edema or skin discoloration or tenderness  Pelvic: Vulva: Normal             Vagina: No gross lesions or discharge.  Cystocele grade 2 out of 4.  Cervix/Uterus absent  Adnexa  Without masses or tenderness  Anus: Normal   Assessment/Plan:  64 y.o. female for annual exam   1. Well female exam with routine gynecological exam Gynecologic exam status post total hysterectomy and menopause.  As symptomatic cystocele grade 2 out of 4.  No indication to repeat Pap tests.  Breast exam normal.  Screening mammogram scheduled in June 2021.  Colonoscopy March 2017.  Health labs with family physician.  2.  S/P total hysterectomy  3. Postmenopause Well on no hormone replacement therapy.  4. Screening for osteoporosis Schedule bone density here now.  Vitamin D supplements, calcium intake of 1200 mg daily and regular weightbearing physical activities to continue.  5. Class 1 obesity due to excess calories with serious comorbidity and body mass index (BMI) of 31.0 to 31.9 in adult Recommend a lower calorie/carb diet such as [employer] and diabetic diet.  Aerobic activities 5 times a week and light weightlifting to continue every 2 days.  Princess Bruins MD, 10:39 AM 09/03/2019

## 2019-09-03 NOTE — Patient Instructions (Signed)
1. Well female exam with routine gynecological exam Gynecologic exam status post total hysterectomy and menopause.  As symptomatic cystocele grade 2 out of 4.  No indication to repeat Pap tests.  Breast exam normal.  Screening mammogram scheduled in June 2021.  Colonoscopy March 2017.  Health labs with family physician.  2. S/P total hysterectomy  3. Postmenopause Well on no hormone replacement therapy.  4. Screening for osteoporosis Schedule bone density here now.  Vitamin D supplements, calcium intake of 1200 mg daily and regular weightbearing physical activities to continue.  5. Class 1 obesity due to excess calories with serious comorbidity and body mass index (BMI) of 31.0 to 31.9 in adult Recommend a lower calorie/carb diet such as [employer] and diabetic diet.  Aerobic activities 5 times a week and light weightlifting to continue every 2 days.  Amanda Eaton, it was a pleasure seeing you today!

## 2019-09-11 ENCOUNTER — Other Ambulatory Visit: Payer: Self-pay | Admitting: Adult Health

## 2019-09-11 DIAGNOSIS — I10 Essential (primary) hypertension: Secondary | ICD-10-CM

## 2019-09-12 NOTE — Telephone Encounter (Signed)
Denied.  Pt is past due for cpx. 

## 2019-09-13 ENCOUNTER — Other Ambulatory Visit: Payer: Self-pay | Admitting: Adult Health

## 2019-09-13 DIAGNOSIS — I10 Essential (primary) hypertension: Secondary | ICD-10-CM

## 2019-09-15 DIAGNOSIS — E119 Type 2 diabetes mellitus without complications: Secondary | ICD-10-CM | POA: Diagnosis not present

## 2019-09-15 DIAGNOSIS — H26492 Other secondary cataract, left eye: Secondary | ICD-10-CM | POA: Diagnosis not present

## 2019-09-15 DIAGNOSIS — H33312 Horseshoe tear of retina without detachment, left eye: Secondary | ICD-10-CM | POA: Diagnosis not present

## 2019-09-15 DIAGNOSIS — H35372 Puckering of macula, left eye: Secondary | ICD-10-CM | POA: Diagnosis not present

## 2019-09-16 NOTE — Telephone Encounter (Signed)
[place] TO THE PHARMACY.  PT IS PAST DUE FOR CPX.  NO FURTHER REFILLS UNTIL SCHEDULED.

## 2019-10-13 DIAGNOSIS — Z1231 Encounter for screening mammogram for malignant neoplasm of breast: Secondary | ICD-10-CM | POA: Diagnosis not present

## 2019-10-13 LAB — HM MAMMOGRAPHY

## 2019-10-14 ENCOUNTER — Other Ambulatory Visit: Payer: Self-pay

## 2019-10-14 ENCOUNTER — Ambulatory Visit (INDEPENDENT_AMBULATORY_CARE_PROVIDER_SITE_OTHER): Payer: BC Managed Care – PPO

## 2019-10-14 ENCOUNTER — Other Ambulatory Visit: Payer: Self-pay | Admitting: Obstetrics & Gynecology

## 2019-10-14 DIAGNOSIS — M8589 Other specified disorders of bone density and structure, multiple sites: Secondary | ICD-10-CM

## 2019-10-14 DIAGNOSIS — Z780 Asymptomatic menopausal state: Secondary | ICD-10-CM | POA: Diagnosis not present

## 2019-10-14 DIAGNOSIS — Z13820 Encounter for screening for osteoporosis: Secondary | ICD-10-CM

## 2019-10-22 ENCOUNTER — Encounter: Payer: Self-pay | Admitting: Family Medicine

## 2019-10-22 ENCOUNTER — Telehealth: Payer: Self-pay | Admitting: Adult Health

## 2019-10-22 DIAGNOSIS — I10 Essential (primary) hypertension: Secondary | ICD-10-CM

## 2019-10-22 DIAGNOSIS — E782 Mixed hyperlipidemia: Secondary | ICD-10-CM

## 2019-10-23 NOTE — Telephone Encounter (Signed)
Denied.  Pt needs to be scheduled for cpx.

## 2019-10-24 MED ORDER — LISINOPRIL-HYDROCHLOROTHIAZIDE 10-12.5 MG PO TABS: 0.5000 | ORAL_TABLET | Freq: Every day | ORAL | 0 refills | Status: DC

## 2019-10-24 MED ORDER — SIMVASTATIN 5 MG PO TABS: 5.0000 mg | ORAL_TABLET | Freq: Every day | ORAL | 0 refills | Status: DC

## 2019-10-24 NOTE — Telephone Encounter (Signed)
Pt is scheduled for CPE on August 24th  And asked that we refill her medication till she is able to be seen.

## 2019-10-24 NOTE — Telephone Encounter (Signed)
Sent to the pharmacy by e-scribe. 

## 2019-10-24 NOTE — Addendum Note (Signed)
Addended by: Miles Costain T on: 10/24/2019 12:08 PM   Modules accepted: Orders

## 2019-10-31 DIAGNOSIS — Z961 Presence of intraocular lens: Secondary | ICD-10-CM | POA: Diagnosis not present

## 2019-10-31 DIAGNOSIS — H26492 Other secondary cataract, left eye: Secondary | ICD-10-CM | POA: Diagnosis not present

## 2019-10-31 DIAGNOSIS — H18413 Arcus senilis, bilateral: Secondary | ICD-10-CM | POA: Diagnosis not present

## 2019-10-31 DIAGNOSIS — E119 Type 2 diabetes mellitus without complications: Secondary | ICD-10-CM | POA: Diagnosis not present

## 2019-11-10 DIAGNOSIS — H26492 Other secondary cataract, left eye: Secondary | ICD-10-CM | POA: Diagnosis not present

## 2019-12-16 ENCOUNTER — Ambulatory Visit (INDEPENDENT_AMBULATORY_CARE_PROVIDER_SITE_OTHER): Payer: PPO | Admitting: Adult Health

## 2019-12-16 ENCOUNTER — Other Ambulatory Visit: Payer: Self-pay

## 2019-12-16 ENCOUNTER — Encounter: Payer: Self-pay | Admitting: Adult Health

## 2019-12-16 VITALS — BP 110/80 | HR 61 | Temp 98.0°F | Ht 66.0 in | Wt 195.2 lb

## 2019-12-16 DIAGNOSIS — E782 Mixed hyperlipidemia: Secondary | ICD-10-CM | POA: Diagnosis not present

## 2019-12-16 DIAGNOSIS — E118 Type 2 diabetes mellitus with unspecified complications: Secondary | ICD-10-CM | POA: Diagnosis not present

## 2019-12-16 DIAGNOSIS — E1169 Type 2 diabetes mellitus with other specified complication: Secondary | ICD-10-CM | POA: Diagnosis not present

## 2019-12-16 DIAGNOSIS — Z0000 Encounter for general adult medical examination without abnormal findings: Secondary | ICD-10-CM

## 2019-12-16 DIAGNOSIS — E669 Obesity, unspecified: Secondary | ICD-10-CM

## 2019-12-16 DIAGNOSIS — I10 Essential (primary) hypertension: Secondary | ICD-10-CM

## 2019-12-16 MED ORDER — JANUMET XR 100-1000 MG PO TB24: 1.0000 | ORAL_TABLET | Freq: Every day | ORAL | 1 refills | Status: DC

## 2019-12-16 MED ORDER — LISINOPRIL-HYDROCHLOROTHIAZIDE 10-12.5 MG PO TABS: 0.5000 | ORAL_TABLET | Freq: Every day | ORAL | 3 refills | Status: DC

## 2019-12-16 MED ORDER — SIMVASTATIN 5 MG PO TABS: 5.0000 mg | ORAL_TABLET | Freq: Every day | ORAL | 3 refills | Status: DC

## 2019-12-16 NOTE — Addendum Note (Signed)
Addended by: Marrion Coy on: 12/16/2019 07:47 AM   Modules accepted: Orders

## 2019-12-16 NOTE — Progress Notes (Signed)
Subjective:    Patient ID: Amanda Eaton, female    DOB: 02/17/1956, 64 y.o.   MRN: [id number]  HPI Patient presents for yearly preventative medicine examination. She is a pleasant 64 year old female who  has a past medical history of DIABETES MELLITUS, TYPE II (11/23/2006), HYPERLIPIDEMIA, BORDERLINE (04/08/2008), HYPERTENSION (11/23/2006), and WEIGHT GAIN (10/12/2008).    DM -seen every 6 months.  She has been very well controlled on Janumet ER [phone] mg.  She does monitor her blood sugars at home and reports readings between 90 and 110.  She denies hypoglycemic events. Lab Results  Component Value Date   HGBA1C 6.1 03/05/2019   Hypertension-currently prescribed Zestoretic 10-12.5 mg daily.  She denies dizziness, lightheadedness, chest pain, shortness of breath, or syncopal episodes BP Readings from Last 3 Encounters:  12/16/19 110/80  09/03/19 130/80  09/02/18 (!) 150/100   Hyperlipidemia- currently prescribed simvastatin 5 mg daily Lab Results  Component Value Date   CHOL 160 04/09/2018   HDL 74.70 04/09/2018   LDLCALC 77 04/09/2018   LDLDIRECT 158.8 10/04/2010   TRIG 43.0 04/09/2018   CHOLHDL 2 04/09/2018    All immunizations and health maintenance protocols were reviewed with the patient and needed orders were placed.   Appropriate screening laboratory values were ordered for the patient including screening of hyperlipidemia, renal function and hepatic function.  Medication reconciliation,  past medical history, social history, problem list and allergies were reviewed in detail with the patient  Goals were established with regard to weight loss, exercise, and  diet in compliance with medications. She is walking 2 miles every day.   Wt Readings from Last 3 Encounters:  12/16/19 195 lb 3.2 oz (88.5 kg)  09/03/19 193 lb (87.5 kg)  09/02/18 183 lb (83 kg)   End of life planning was discussed.  She is up-to-date on routine colon cancer screening and mammograms   Review  of Systems  Constitutional: Negative.   HENT: Negative.   Eyes: Negative.   Respiratory: Negative.   Cardiovascular: Negative.   Gastrointestinal: Negative.   Endocrine: Negative.   Genitourinary: Negative.   Musculoskeletal: Negative.   Skin: Negative.   Allergic/Immunologic: Negative.   Neurological: Negative.   Hematological: Negative.   Psychiatric/Behavioral: Negative.    Past Medical History:  Diagnosis Date  . DIABETES MELLITUS, TYPE II 11/23/2006  . [place], [place] 04/08/2008  . HYPERTENSION 11/23/2006  . WEIGHT GAIN 10/12/2008    Social History   Socioeconomic History  . Marital status: Married    Spouse name: Not on file  . Number of children: Not on file  . Years of education: Not on file  . Highest education level: Not on file  Occupational History  . Not on file  Tobacco Use  . Smoking status: Never Smoker  . Smokeless tobacco: Never Used  Vaping Use  . Vaping Use: Never used  Substance and Sexual Activity  . Alcohol use: No    Alcohol/week: 0.0 standard drinks  . Drug use: No  . Sexual activity: Yes    Partners: Male    Comment: 1st intercourse- 10, married- 38 yrs   Other Topics Concern  . 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 All live locally    Social Determinants of Health   Financial Resource Strain:   . Difficulty of Paying Living Expenses: Not  on file  Food Insecurity:   . Worried About Charity fundraiser in the Last Year: Not on file  . Ran Out of Food in the Last Year: Not on file  Transportation Needs:   . Lack of Transportation (Medical): Not on file  . Lack of Transportation (Non-Medical): Not on file  Physical Activity:   . Days of Exercise per Week: Not on file  . Minutes of Exercise per Session: Not on file  Stress:   . Feeling of Stress : Not on file  Social Connections:   . Frequency of Communication with  Friends and Family: Not on file  . Frequency of Social Gatherings with Friends and Family: Not on file  . Attends Religious Services: Not on file  . Active Member of Clubs or Organizations: Not on file  . Attends Archivist Meetings: Not on file  . Marital Status: Not on file  Intimate Partner Violence:   . Fear of Current or Ex-Partner: Not on file  . Emotionally Abused: Not on file  . Physically Abused: Not on file  . Sexually Abused: Not on file    Past Surgical History:  Procedure Laterality Date  . ANTERIOR AND POSTERIOR REPAIR N/A 07/17/2012   Procedure: ANTERIOR (CYSTOCELE) AND POSTERIOR REPAIR (RECTOCELE);  Surgeon: Princess Bruins, MD;  Location: [facility];  Service: Gynecology;  Laterality: N/A;  . CATARACT EXTRACTION  2015  . HYSTEROSCOPY W/ ENDOMETRIAL ABLATION     1987  . MYOMECTOMY VAGINAL APPROACH    . ROBOTIC ASSISTED TOTAL HYSTERECTOMY N/A 07/17/2012   Procedure: ROBOTIC ASSISTED TOTAL HYSTERECTOMY with Utero-sacral Ligament Suspension;  Surgeon: Princess Bruins, MD;  Location: [facility];  Service: Gynecology;  Laterality: N/A;    Family History  Problem Relation Age of Onset  . Diabetes Mother   . Hyperlipidemia Mother   . Hypertension Mother   . COPD Father   . Lung cancer Brother        lung  . Diabetes Maternal Grandfather   . Cancer Daughter   . Colon cancer Neg Hx     No Known Allergies  Current Outpatient Medications on File Prior to Visit  Medication Sig Dispense Refill  . ibuprofen (ADVIL,MOTRIN) 200 MG tablet Take 400 mg by mouth every 6 (six) hours as needed for moderate pain.     Marland Kitchen JANUMET XR [phone] MG TB24 TAKE 1 TABLET DAILY 90 tablet 1  . lisinopril-hydrochlorothiazide (ZESTORETIC) 10-12.5 MG tablet Take 0.5 tablets by mouth daily. **DUE FOR YEARLY PHYSICAL** 45 tablet 0  . metoCLOPramide (REGLAN) 10 MG tablet Take 1 tablet (10 mg total) by mouth every 6 (six) hours as needed for nausea or vomiting. 6 tablet 0  . Multiple  Vitamins-Minerals (WOMENS MULTI VITAMIN & MINERAL PO) Take 1 tablet by mouth 2 (two) times daily.     . simvastatin (ZOCOR) 5 MG tablet Take 1 tablet (5 mg total) by mouth daily. 90 tablet 0  . TURMERIC PO Take 3 tablets by mouth 3 (three) times daily.      No current facility-administered medications on file prior to visit.    BP 110/80 (BP Location: Right Arm, Patient Position: Sitting, Cuff Size: Large)   Pulse 61   Temp 98 F (36.7 C) (Oral)   Ht '5\' 6"'  (1.676 m)   Wt 195 lb 3.2 oz (88.5 kg)   SpO2 97%   BMI 31.51 kg/m       Objective:   Physical Exam Vitals and nursing note reviewed.  Constitutional:      General: She is not in acute distress.    Appearance: Normal appearance. She is well-developed. She is obese. She is not ill-appearing.  HENT:     Head: Normocephalic and atraumatic.     Right Ear: Tympanic membrane, ear canal and external ear normal. There is no impacted cerumen.     Left Ear: Tympanic membrane, ear canal and external ear normal. There is no impacted cerumen.     Nose: Nose normal. No congestion or rhinorrhea.     Mouth/Throat:     Mouth: Mucous membranes are moist.     Pharynx: Oropharynx is clear. No oropharyngeal exudate or posterior oropharyngeal erythema.  Eyes:     General:        Right eye: No discharge.        Left eye: No discharge.     Extraocular Movements: Extraocular movements intact.     Conjunctiva/sclera: Conjunctivae normal.     Pupils: Pupils are equal, round, and reactive to light.  Neck:     Thyroid: No thyromegaly.     Vascular: No carotid bruit.     Trachea: No tracheal deviation.  Cardiovascular:     Rate and Rhythm: Normal rate and regular rhythm.     Pulses: Normal pulses.     Heart sounds: Normal heart sounds. No murmur heard.  No friction rub. No gallop.   Pulmonary:     Effort: Pulmonary effort is normal. No respiratory distress.     Breath sounds: Normal breath sounds. No stridor. No wheezing, rhonchi or rales.    Chest:     Chest wall: No tenderness.  Abdominal:     General: Abdomen is flat. Bowel sounds are normal. There is no distension.     Palpations: Abdomen is soft. There is no mass.     Tenderness: There is no abdominal tenderness. There is no right CVA tenderness, left CVA tenderness, guarding or rebound.     Hernia: No hernia is present.  Musculoskeletal:        General: No swelling, tenderness, deformity or signs of injury. Normal range of motion.     Cervical back: Normal range of motion and neck supple.     Right lower leg: No edema.     Left lower leg: No edema.  Lymphadenopathy:     Cervical: No cervical adenopathy.  Skin:    General: Skin is warm and dry.     Coloration: Skin is not jaundiced or pale.     Findings: No bruising, erythema, lesion or rash.  Neurological:     General: No focal deficit present.     Mental Status: She is alert and oriented to person, place, and time.     Cranial Nerves: No cranial nerve deficit.     Sensory: No sensory deficit.     Motor: No weakness.     Coordination: Coordination normal.     Gait: Gait normal.     Deep Tendon Reflexes: Reflexes normal.  Psychiatric:        Mood and Affect: Mood normal.        Behavior: Behavior normal.        Thought Content: Thought content normal.        Judgment: Judgment normal.        Assessment & Plan:  1. Routine general medical examination at a health care facility - Continue to eat healthy and exercise  - Follow up in one year or sooner if needed -  CBC with Differential/Platelet; Future - Hemoglobin A1c; Future - Lipid panel; Future - TSH; Future - CMP with eGFR([facility]); Future  2. Essential hypertension - well controlled. No change in medications  - Likely 6 month follow up.  - CBC with Differential/Platelet; Future - Hemoglobin A1c; Future - Lipid panel; Future - TSH; Future - CMP with eGFR([facility]); Future - lisinopril-hydrochlorothiazide (ZESTORETIC) 10-12.5 MG tablet; Take 0.5  tablets by mouth daily.  Dispense: 45 tablet; Refill: 3  3. Controlled type 2 diabetes mellitus with complication, without long-term current use of insulin ([place]) - Encouraged to continue to eat healthy and exercise  - CBC with Differential/Platelet; Future - Hemoglobin A1c; Future - Lipid panel; Future - TSH; Future - CMP with eGFR([facility]); Future - SitaGLIPtin-MetFORMIN HCl (JANUMET XR) [phone] MG TB24; Take 1 tablet by mouth daily.  Dispense: 90 tablet; Refill: 1  4. Mixed hyperlipidemia - Consider increasing Lipitor  - CBC with Differential/Platelet; Future - Hemoglobin A1c; Future - Lipid panel; Future - TSH; Future - CMP with eGFR([facility]); Future - simvastatin (ZOCOR) 5 MG tablet; Take 1 tablet (5 mg total) by mouth daily.  Dispense: 90 tablet; Refill: 3  Dorothyann Peng, NP

## 2019-12-17 ENCOUNTER — Telehealth: Payer: Self-pay | Admitting: Adult Health

## 2019-12-17 LAB — HEMOGLOBIN A1C
Hgb A1c MFr Bld: 5.8 % of total Hgb — ABNORMAL HIGH (ref <5.7)
Mean Plasma Glucose: 120 (calc)
eAG (mmol/L): 6.6 (calc)

## 2019-12-17 LAB — CBC WITH DIFFERENTIAL/PLATELET
Absolute Monocytes: 374 cells/uL (ref 200–950)
Basophils Absolute: 22 cells/uL (ref 0–200)
Basophils Relative: 0.5 %
Eosinophils Absolute: 101 cells/uL (ref 15–500)
Eosinophils Relative: 2.3 %
HCT: 37.4 % (ref 35.0–45.0)
Hemoglobin: 12.1 g/dL (ref 11.7–15.5)
Lymphs Abs: 1434 cells/uL (ref 850–3900)
MCH: 29.4 pg (ref 27.0–33.0)
MCHC: 32.4 g/dL (ref 32.0–36.0)
MCV: 91 fL (ref 80.0–100.0)
MPV: 11 fL (ref 7.5–12.5)
Monocytes Relative: 8.5 %
Neutro Abs: 2468 cells/uL (ref 1500–7800)
Neutrophils Relative %: 56.1 %
Platelets: 184 10*3/uL (ref 140–400)
RBC: 4.11 10*6/uL (ref 3.80–5.10)
RDW: 12.5 % (ref 11.0–15.0)
Total Lymphocyte: 32.6 %
WBC: 4.4 10*3/uL (ref 3.8–10.8)

## 2019-12-17 LAB — COMPLETE METABOLIC PANEL WITH GFR
AG Ratio: 2 (calc) (ref 1.0–2.5)
ALT: 13 U/L (ref 6–29)
AST: 13 U/L (ref 10–35)
Albumin: 4 g/dL (ref 3.6–5.1)
Alkaline phosphatase (APISO): 53 U/L (ref 37–153)
BUN: 25 mg/dL (ref 7–25)
CO2: 30 mmol/L (ref 20–32)
Calcium: 8.9 mg/dL (ref 8.6–10.4)
Chloride: 106 mmol/L (ref 98–110)
Creat: 0.77 mg/dL (ref 0.50–0.99)
GFR, Est African American: 94 mL/min/{1.73_m2} (ref >=60)
GFR, Est Non African American: 81 mL/min/{1.73_m2} (ref >=60)
Globulin: 2 g/dL (calc) (ref 1.9–3.7)
Glucose, Bld: 102 mg/dL — ABNORMAL HIGH (ref 65–99)
Potassium: 4.2 mmol/L (ref 3.5–5.3)
Sodium: 141 mmol/L (ref 135–146)
Total Bilirubin: 0.5 mg/dL (ref 0.2–1.2)
Total Protein: 6 g/dL — ABNORMAL LOW (ref 6.1–8.1)

## 2019-12-17 LAB — TSH: TSH: 1.47 mIU/L (ref 0.40–4.50)

## 2019-12-17 LAB — LIPID PANEL
Cholesterol: 159 mg/dL (ref <200)
HDL: 65 mg/dL (ref >=50)
LDL Cholesterol (Calc): 81 mg/dL (calc)
Non-HDL Cholesterol (Calc): 94 mg/dL (calc) (ref <130)
Total CHOL/HDL Ratio: 2.4 (calc) (ref <5.0)
Triglycerides: 52 mg/dL (ref <150)

## 2019-12-17 NOTE — Telephone Encounter (Signed)
Pt is calling back to get more clarification on her lab results. She did speak with someone but more questions has came up.  Please advise

## 2019-12-17 NOTE — Telephone Encounter (Signed)
Returned call to patient and confirmed A1C number. Patient verbalized understanding and stated that she scheduled her 6 month follow-up. Nothing further needed at this time.

## 2020-06-18 ENCOUNTER — Ambulatory Visit: Payer: PPO | Admitting: Adult Health

## 2020-06-21 ENCOUNTER — Other Ambulatory Visit: Payer: Self-pay

## 2020-06-22 ENCOUNTER — Ambulatory Visit (INDEPENDENT_AMBULATORY_CARE_PROVIDER_SITE_OTHER): Payer: PPO | Admitting: Adult Health

## 2020-06-22 VITALS — BP 139/85 | HR 67 | Temp 98.2°F | Ht 66.0 in | Wt 200.4 lb

## 2020-06-22 DIAGNOSIS — E782 Mixed hyperlipidemia: Secondary | ICD-10-CM

## 2020-06-22 DIAGNOSIS — I10 Essential (primary) hypertension: Secondary | ICD-10-CM | POA: Diagnosis not present

## 2020-06-22 DIAGNOSIS — E118 Type 2 diabetes mellitus with unspecified complications: Secondary | ICD-10-CM | POA: Diagnosis not present

## 2020-06-22 DIAGNOSIS — Z23 Encounter for immunization: Secondary | ICD-10-CM | POA: Diagnosis not present

## 2020-06-22 LAB — COMPREHENSIVE METABOLIC PANEL
ALT: 17 U/L (ref 0–35)
AST: 17 U/L (ref 0–37)
Albumin: 4.1 g/dL (ref 3.5–5.2)
Alkaline Phosphatase: 59 U/L (ref 39–117)
BUN: 27 mg/dL — ABNORMAL HIGH (ref 6–23)
CO2: 31 mEq/L (ref 19–32)
Calcium: 9.4 mg/dL (ref 8.4–10.5)
Chloride: 104 mEq/L (ref 96–112)
Creatinine, Ser: 0.74 mg/dL (ref 0.40–1.20)
GFR: 84.63 mL/min (ref >60.00)
Glucose, Bld: 95 mg/dL (ref 70–99)
Potassium: 4.1 mEq/L (ref 3.5–5.1)
Sodium: 139 mEq/L (ref 135–145)
Total Bilirubin: 0.6 mg/dL (ref 0.2–1.2)
Total Protein: 6.3 g/dL (ref 6.0–8.3)

## 2020-06-22 LAB — CBC WITH DIFFERENTIAL/PLATELET
Basophils Absolute: 0 10*3/uL (ref 0.0–0.1)
Basophils Relative: 0.4 % (ref 0.0–3.0)
Eosinophils Absolute: 0.1 10*3/uL (ref 0.0–0.7)
Eosinophils Relative: 2.3 % (ref 0.0–5.0)
HCT: 38.1 % (ref 36.0–46.0)
Hemoglobin: 12.7 g/dL (ref 12.0–15.0)
Lymphocytes Relative: 27.9 % (ref 12.0–46.0)
Lymphs Abs: 1.3 10*3/uL (ref 0.7–4.0)
MCHC: 33.4 g/dL (ref 30.0–36.0)
MCV: 86.9 fl (ref 78.0–100.0)
Monocytes Absolute: 0.4 10*3/uL (ref 0.1–1.0)
Monocytes Relative: 8.3 % (ref 3.0–12.0)
Neutro Abs: 2.9 10*3/uL (ref 1.4–7.7)
Neutrophils Relative %: 61.1 % (ref 43.0–77.0)
Platelets: 188 10*3/uL (ref 150.0–400.0)
RBC: 4.38 Mil/uL (ref 3.87–5.11)
RDW: 13.6 % (ref 11.5–15.5)
WBC: 4.8 10*3/uL (ref 4.0–10.5)

## 2020-06-22 LAB — LIPID PANEL
Cholesterol: 185 mg/dL (ref 0–200)
HDL: 70.9 mg/dL (ref >39.00)
LDL Cholesterol: 101 mg/dL — ABNORMAL HIGH (ref 0–99)
NonHDL: 114.26
Total CHOL/HDL Ratio: 3
Triglycerides: 64 mg/dL (ref 0.0–149.0)
VLDL: 12.8 mg/dL (ref 0.0–40.0)

## 2020-06-22 LAB — HEMOGLOBIN A1C: Hgb A1c MFr Bld: 6.3 % (ref 4.6–6.5)

## 2020-06-22 MED ORDER — JANUMET XR 100-1000 MG PO TB24: 1.0000 | ORAL_TABLET | Freq: Every day | ORAL | 1 refills | Status: DC

## 2020-06-22 NOTE — Progress Notes (Signed)
Subjective:    Patient ID: Amanda Eaton, female    DOB: 1955/11/05, 65 y.o.   MRN: [id number]  HPI Patient presents for six month follow up.  She is a pleasant 65 year old female who  has a past medical history of DIABETES MELLITUS, TYPE II (11/23/2006), HYPERLIPIDEMIA, BORDERLINE (04/08/2008), HYPERTENSION (11/23/2006), and WEIGHT GAIN (10/12/2008).  DM -is seen every 6 months.  She has been very well controlled on Janumet ER [phone] mg.  She does monitor her blood sugars at home periodically and reports readings between 90 and 110.  She denies hypoglycemic events  Lab Results  Component Value Date   HGBA1C 5.8 (H) 12/16/2019   Hypertension-is prescribed Zestoretic 10-12.5 mg daily- 1/2 tab.  She denies dizziness, lightheadedness, chest pain, shortness of breath, or syncopal episodes  BP Readings from Last 3 Encounters:  06/22/20 139/85  12/16/19 110/80  09/03/19 130/80   Hyperlipidemia-is currently prescribed simvastatin 5 mg daily.  She denies myalgia or fatigue  Lab Results  Component Value Date   CHOL 159 12/16/2019   HDL 65 12/16/2019   LDLCALC 81 12/16/2019   LDLDIRECT 158.8 10/04/2010   TRIG 52 12/16/2019   CHOLHDL 2.4 12/16/2019    All immunizations and health maintenance protocols were reviewed with the patient and needed orders were placed.  Appropriate screening laboratory values were ordered for the patient including screening of hyperlipidemia, renal function and hepatic function.  Medication reconciliation,  past medical history, social history, problem list and allergies were reviewed in detail with the patient  Goals were established with regard to weight loss, exercise, and  diet in compliance with medications  Wt Readings from Last 3 Encounters:  06/22/20 200 lb 6.4 oz (90.9 kg)  12/16/19 195 lb 3.2 oz (88.5 kg)  09/03/19 193 lb (87.5 kg)   She is up to date on routine colon cancer screening. She is due for diabetic eye exam.   Review of Systems   Constitutional: Negative.   HENT: Negative.   Eyes: Negative.   Respiratory: Negative.   Cardiovascular: Negative.   Gastrointestinal: Negative.   Endocrine: Negative.   Genitourinary: Negative.   Musculoskeletal: Negative.   Skin: Negative.   Allergic/Immunologic: Negative.   Neurological: Negative.   Hematological: Negative.   Psychiatric/Behavioral: Negative.    Past Medical History:  Diagnosis Date  . DIABETES MELLITUS, TYPE II 11/23/2006  . [place], [place] 04/08/2008  . HYPERTENSION 11/23/2006  . WEIGHT GAIN 10/12/2008    Social History   Socioeconomic History  . Marital status: Married    Spouse name: Not on file  . Number of children: Not on file  . Years of education: Not on file  . Highest education level: Not on file  Occupational History  . Not on file  Tobacco Use  . Smoking status: Never Smoker  . Smokeless tobacco: Never Used  Vaping Use  . Vaping Use: Never used  Substance and Sexual Activity  . Alcohol use: No    Alcohol/week: 0.0 standard drinks  . Drug use: No  . Sexual activity: Yes    Partners: Male    Comment: 1st intercourse- 41, married- 33 yrs   Other Topics Concern  . 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 All live locally    Social Determinants of Health   Financial Resource Strain: Not on file  Food Insecurity: Not on file  Transportation Needs: Not on file  Physical Activity: Not on file  Stress: Not on file  Social Connections: Not on file  Intimate Partner Violence: Not on file    Past Surgical History:  Procedure Laterality Date  . ANTERIOR AND POSTERIOR REPAIR N/A 07/17/2012   Procedure: ANTERIOR (CYSTOCELE) AND POSTERIOR REPAIR (RECTOCELE);  Surgeon: Princess Bruins, MD;  Location: [facility];  Service: Gynecology;  Laterality: N/A;  . CATARACT EXTRACTION  2015  . HYSTEROSCOPY W/ ENDOMETRIAL ABLATION      1987  . MYOMECTOMY VAGINAL APPROACH    . ROBOTIC ASSISTED TOTAL HYSTERECTOMY N/A 07/17/2012   Procedure: ROBOTIC ASSISTED TOTAL HYSTERECTOMY with Utero-sacral Ligament Suspension;  Surgeon: Princess Bruins, MD;  Location: [facility];  Service: Gynecology;  Laterality: N/A;    Family History  Problem Relation Age of Onset  . Diabetes Mother   . Hyperlipidemia Mother   . Hypertension Mother   . COPD Father   . Lung cancer Brother        lung  . Diabetes Maternal Grandfather   . Cancer Daughter   . Colon cancer Neg Hx     No Known Allergies  Current Outpatient Medications on File Prior to Visit  Medication Sig Dispense Refill  . ibuprofen (ADVIL,MOTRIN) 200 MG tablet Take 400 mg by mouth every 6 (six) hours as needed for moderate pain.     Marland Kitchen lisinopril-hydrochlorothiazide (ZESTORETIC) 10-12.5 MG tablet Take 0.5 tablets by mouth daily. 45 tablet 3  . Multiple Vitamins-Minerals (WOMENS MULTI VITAMIN & MINERAL PO) Take 1 tablet by mouth 2 (two) times daily.     . simvastatin (ZOCOR) 5 MG tablet Take 1 tablet (5 mg total) by mouth daily. 90 tablet 3  . SitaGLIPtin-MetFORMIN HCl (JANUMET XR) [phone] MG TB24 Take 1 tablet by mouth daily. 90 tablet 1  . TURMERIC PO Take 3 tablets by mouth 3 (three) times daily.     . metoCLOPramide (REGLAN) 10 MG tablet Take 1 tablet (10 mg total) by mouth every 6 (six) hours as needed for nausea or vomiting. (Patient not taking: Reported on 06/22/2020) 6 tablet 0   No current facility-administered medications on file prior to visit.    BP 139/85 (BP Location: Left Arm, Patient Position: Sitting, Cuff Size: Normal)   Pulse 67   Temp 98.2 F (36.8 C) (Oral)   Ht 5\' 6"  (1.676 m)   Wt 200 lb 6.4 oz (90.9 kg)   SpO2 97%   BMI 32.35 kg/m       Objective:   Physical Exam Vitals and nursing note reviewed.  Constitutional:      General: She is not in acute distress.    Appearance: Normal appearance. She is well-developed. She is not ill-appearing.   HENT:     Head: Normocephalic and atraumatic.     Right Ear: Tympanic membrane, ear canal and external ear normal. There is no impacted cerumen.     Left Ear: Tympanic membrane, ear canal and external ear normal. There is no impacted cerumen.     Nose: Nose normal. No congestion or rhinorrhea.     Mouth/Throat:     Mouth: Mucous membranes are moist.     Pharynx: Oropharynx is clear. No oropharyngeal exudate or posterior oropharyngeal erythema.  Eyes:     General:        Right eye: No discharge.        Left eye: No discharge.     Extraocular Movements: Extraocular movements intact.  Conjunctiva/sclera: Conjunctivae normal.     Pupils: Pupils are equal, round, and reactive to light.  Neck:     Thyroid: No thyromegaly.     Vascular: No carotid bruit.     Trachea: No tracheal deviation.  Cardiovascular:     Rate and Rhythm: Normal rate and regular rhythm.     Pulses: Normal pulses.     Heart sounds: Normal heart sounds. No murmur heard. No friction rub. No gallop.   Pulmonary:     Effort: Pulmonary effort is normal. No respiratory distress.     Breath sounds: Normal breath sounds. No stridor. No wheezing, rhonchi or rales.  Chest:     Chest wall: No tenderness.  Abdominal:     General: Abdomen is flat. Bowel sounds are normal. There is no distension.     Palpations: Abdomen is soft. There is no mass.     Tenderness: There is no abdominal tenderness. There is no right CVA tenderness, left CVA tenderness, guarding or rebound.     Hernia: No hernia is present.  Musculoskeletal:        General: No swelling, tenderness, deformity or signs of injury. Normal range of motion.     Cervical back: Normal range of motion and neck supple.     Right lower leg: No edema.     Left lower leg: No edema.  Lymphadenopathy:     Cervical: No cervical adenopathy.  Skin:    General: Skin is warm and dry.     Coloration: Skin is not jaundiced or pale.     Findings: No bruising, erythema, lesion  or rash.  Neurological:     General: No focal deficit present.     Mental Status: She is alert and oriented to person, place, and time.     Cranial Nerves: No cranial nerve deficit.     Sensory: No sensory deficit.     Motor: No weakness.     Coordination: Coordination normal.     Gait: Gait normal.     Deep Tendon Reflexes: Reflexes normal.  Psychiatric:        Mood and Affect: Mood normal.        Behavior: Behavior normal.        Thought Content: Thought content normal.        Judgment: Judgment normal.       Assessment & Plan:  1. Essential hypertension - Continue with current dose  - encouraged weight loss through diet and exercise - CBC with Differential/Platelet; Future - Comprehensive metabolic panel; Future - Hemoglobin A1c; Future - Lipid panel; Future  2. Controlled type 2 diabetes mellitus with complication, without long-term current use of insulin ([place]) - Consider adding agent  - CBC with Differential/Platelet; Future - Comprehensive metabolic panel; Future - Hemoglobin A1c; Future - Lipid panel; Future - SitaGLIPtin-MetFORMIN HCl (JANUMET XR) [phone] MG TB24; Take 1 tablet by mouth daily.  Dispense: 90 tablet; Refill: 1  3. Mixed hyperlipidemia - Consider increase in statin  - CBC with Differential/Platelet; Future - Comprehensive metabolic panel; Future - Hemoglobin A1c; Future - Lipid panel; Future  4. Need for Streptococcus pneumoniae vaccination  - Pneumococcal polysaccharide vaccine 23-valent greater than or equal to 2yo subcutaneous/IM  Dorothyann Peng, NP

## 2020-06-22 NOTE — Patient Instructions (Signed)
It was great seeing you today   We will follow up with you regarding your blood work   I will see you back in six month for your physical

## 2020-09-07 ENCOUNTER — Encounter: Payer: Self-pay | Admitting: Obstetrics & Gynecology

## 2020-09-07 ENCOUNTER — Ambulatory Visit: Payer: PPO | Admitting: Obstetrics & Gynecology

## 2020-09-07 ENCOUNTER — Other Ambulatory Visit: Payer: Self-pay

## 2020-09-07 VITALS — BP 110/72 | HR 69 | Ht 66.0 in | Wt 198.0 lb

## 2020-09-07 DIAGNOSIS — Z780 Asymptomatic menopausal state: Secondary | ICD-10-CM | POA: Diagnosis not present

## 2020-09-07 DIAGNOSIS — Z6831 Body mass index (BMI) 31.0-31.9, adult: Secondary | ICD-10-CM

## 2020-09-07 DIAGNOSIS — M85852 Other specified disorders of bone density and structure, left thigh: Secondary | ICD-10-CM

## 2020-09-07 DIAGNOSIS — E6609 Other obesity due to excess calories: Secondary | ICD-10-CM

## 2020-09-07 DIAGNOSIS — N8110 Cystocele, unspecified: Secondary | ICD-10-CM

## 2020-09-07 DIAGNOSIS — Z01419 Encounter for gynecological examination (general) (routine) without abnormal findings: Secondary | ICD-10-CM | POA: Diagnosis not present

## 2020-09-07 DIAGNOSIS — Z90710 Acquired absence of both cervix and uterus: Secondary | ICD-10-CM | POA: Diagnosis not present

## 2020-09-07 NOTE — Progress Notes (Signed)
Amanda Eaton [birth year] [id number]   History:    65 y.o. G3P3L3 Married. 4 grand-children  OV:[id number] presenting for annual gyn exam   HPI:S/P Total Hysterectomy. Postmenopause, well on no HRT. No pelvic pain. No pain with IC.Urine/BMs normal. Minimal Sxs from Cystocele.Breasts normal. BMI 31.96. Health labs with Fam MD. DM on Janumet. cHTN on Lisinopril/HCT. Hypercholesterolemia on Zocor.  Colono 2017 normal.   Past medical history,surgical history, family history and social history were all reviewed and documented in the EPIC chart.  Gynecologic History No LMP recorded. Patient has had a hysterectomy.  Obstetric History OB History  Gravida Para Term Preterm AB Living  3 3 3     3   SAB IAB Ectopic Multiple Live Births               # Outcome Date GA Lbr Len/2nd Weight Sex Delivery Anes PTL Lv  3 Term           2 Term           1 Term              ROS: A ROS was performed and pertinent positives and negatives are included in the history.  GENERAL: No fevers or chills. HEENT: No change in vision, no earache, sore throat or sinus congestion. NECK: No pain or stiffness. CARDIOVASCULAR: No chest pain or pressure. No palpitations. PULMONARY: No shortness of breath, cough or wheeze. GASTROINTESTINAL: No abdominal pain, nausea, vomiting or diarrhea, melena or bright red blood per rectum. GENITOURINARY: No urinary frequency, urgency, hesitancy or dysuria. MUSCULOSKELETAL: No joint or muscle pain, no back pain, no recent trauma. DERMATOLOGIC: No rash, no itching, no lesions. ENDOCRINE: No polyuria, polydipsia, no heat or cold intolerance. No recent change in weight. HEMATOLOGICAL: No anemia or easy bruising or bleeding. NEUROLOGIC: No headache, seizures, numbness, tingling or weakness. PSYCHIATRIC: No depression, no loss of interest in normal activity or change in sleep pattern.     Exam:   BP 110/72   Pulse 69   Ht 5\' 6"  (1.676 m)   Wt 198 lb (89.8 kg)    SpO2 98%   BMI 31.96 kg/m   Body mass index is 31.96 kg/m.  General appearance : Well developed well nourished female. No acute distress HEENT: Eyes: no retinal hemorrhage or exudates,  Neck supple, trachea midline, no carotid bruits, no thyroidmegaly Lungs: Clear to auscultation, no rhonchi or wheezes, or rib retractions  Heart: Regular rate and rhythm, no murmurs or gallops Breast:Examined in sitting and supine position were symmetrical in appearance, no palpable masses or tenderness,  no skin retraction, no nipple inversion, no nipple discharge, no skin discoloration, no axillary or supraclavicular lymphadenopathy Abdomen: no palpable masses or tenderness, no rebound or guarding Extremities: no edema or skin discoloration or tenderness  Pelvic: Vulva: Normal             Vagina: No gross lesions or discharge.  Cystocele grade 3/4.  Cervix/Uterus absent  Adnexa  Without masses or tenderness  Anus: Normal   Assessment/Plan:  65 y.o. female for annual exam   1. Well female exam with routine gynecological exam Gynecologic exam status post total hysterectomy and menopause.  No indication for a Pap test at this time.  Breast exam normal.  Screening mammogram is scheduled.  Colonoscopy normal in 2017.  Health labs with family physician.  2. S/P total hysterectomy  3. Postmenopause Well on no hormone replacement therapy.  4. Osteopenia of neck of left femur Bone  density June 2021 showed osteopenia with the lowest site at the left femoral neck with a T score of -1.7.  Vitamin D supplements, calcium intake total of 1.5 g/day and regular weightbearing physical activities.  We will repeat a bone density June 2023.  5. Baden-Walker grade 3 cystocele Minimally symptomatic stable cystocele grade 3/4.  Precautions reviewed.  6. Class 1 obesity due to excess calories with serious comorbidity and body mass index (BMI) of 31.0 to 31.9 in adult Recommend a lower calorie/carb diet.  Aerobic  activities 5 times a week and light weightlifting every 2 days.  Princess Bruins MD, 8:13 AM 09/07/2020

## 2020-09-16 DIAGNOSIS — H52223 Regular astigmatism, bilateral: Secondary | ICD-10-CM | POA: Diagnosis not present

## 2020-09-16 DIAGNOSIS — Z961 Presence of intraocular lens: Secondary | ICD-10-CM | POA: Diagnosis not present

## 2020-09-16 DIAGNOSIS — E119 Type 2 diabetes mellitus without complications: Secondary | ICD-10-CM | POA: Diagnosis not present

## 2020-09-16 DIAGNOSIS — H5203 Hypermetropia, bilateral: Secondary | ICD-10-CM | POA: Diagnosis not present

## 2020-09-16 DIAGNOSIS — H35372 Puckering of macula, left eye: Secondary | ICD-10-CM | POA: Diagnosis not present

## 2020-09-16 DIAGNOSIS — H33312 Horseshoe tear of retina without detachment, left eye: Secondary | ICD-10-CM | POA: Diagnosis not present

## 2020-10-13 ENCOUNTER — Telehealth (INDEPENDENT_AMBULATORY_CARE_PROVIDER_SITE_OTHER): Payer: PPO | Admitting: Family Medicine

## 2020-10-13 ENCOUNTER — Other Ambulatory Visit: Payer: Self-pay

## 2020-10-13 ENCOUNTER — Encounter: Payer: Self-pay | Admitting: Family Medicine

## 2020-10-13 ENCOUNTER — Ambulatory Visit (HOSPITAL_COMMUNITY)
Admission: RE | Admit: 2020-10-13 | Discharge: 2020-10-13 | Disposition: A | Discharge status: Home / Self Care | Payer: PPO | Source: Ambulatory Visit | Attending: Family Medicine | Admitting: Family Medicine

## 2020-10-13 VITALS — Temp 101.7°F

## 2020-10-13 DIAGNOSIS — E785 Hyperlipidemia, unspecified: Secondary | ICD-10-CM | POA: Diagnosis not present

## 2020-10-13 DIAGNOSIS — R059 Cough, unspecified: Secondary | ICD-10-CM | POA: Diagnosis not present

## 2020-10-13 DIAGNOSIS — I10 Essential (primary) hypertension: Secondary | ICD-10-CM | POA: Diagnosis not present

## 2020-10-13 DIAGNOSIS — R509 Fever, unspecified: Secondary | ICD-10-CM | POA: Diagnosis not present

## 2020-10-13 DIAGNOSIS — R0982 Postnasal drip: Secondary | ICD-10-CM | POA: Diagnosis not present

## 2020-10-13 MED ORDER — BENZONATATE 100 MG PO CAPS: 100.0000 mg | ORAL_CAPSULE | Freq: Two times a day (BID) | ORAL | 0 refills | Status: DC | PRN

## 2020-10-13 MED ORDER — AMOXICILLIN-POT CLAVULANATE 500-125 MG PO TABS
1.0000 | ORAL_TABLET | Freq: Two times a day (BID) | ORAL | 0 refills | Status: DC
Start: 2020-10-13 — End: 2020-10-16

## 2020-10-13 MED ORDER — FLUTICASONE PROPIONATE 50 MCG/ACT NA SUSP
1.0000 | Freq: Every day | NASAL | 0 refills | Status: DC
Start: 2020-10-13 — End: 2021-08-31

## 2020-10-13 NOTE — Progress Notes (Signed)
Virtual Visit via Video Note  I connected with Rylynn Mazzoni on 10/13/20 at 10:30 AM EDT by a video enabled telemedicine application 2/2 [id number] pandemic and verified that I am speaking with the correct person using two identifiers.  Location patient: home Location provider:work or home office Persons participating in the virtual visit: patient, provider  I discussed the limitations of evaluation and management by telemedicine and the availability of in person appointments. The patient expressed understanding and agreed to proceed.   HPI: Pt with cough that started at the beginning of June.  Cough lingered.  Now with fever 100.2 F.  Pt took 5 COVID test throughout the month of June that were all negative.  Cough causes back to ache. Back hurting on R side, more with laying down on that side.  Cough is nonproductive, more dry.  Having mild post nasal drainage, no appetite the last few days, some nausea this morning.  Able to lay flat but feels like she has to roll over to her side after a while.  Denies facial pain/pressure, ear pain/pressure, diarrhea, sick contacts, SOB, wheezing.  Has taken ibuprofen which helps some.   ROS: See pertinent positives and negatives per HPI.  Past Medical History:  Diagnosis Date   DIABETES MELLITUS, TYPE II 11/23/2006   HYPERLIPIDEMIA, BORDERLINE 04/08/2008   HYPERTENSION 11/23/2006   WEIGHT GAIN 10/12/2008    Past Surgical History:  Procedure Laterality Date   ANTERIOR AND POSTERIOR REPAIR N/A 07/17/2012   Procedure: ANTERIOR (CYSTOCELE) AND POSTERIOR REPAIR (RECTOCELE);  Surgeon: Princess Bruins, MD;  Location: [facility];  Service: Gynecology;  Laterality: N/A;   CATARACT EXTRACTION  2015   HYSTEROSCOPY W/ ENDOMETRIAL ABLATION     1987   MYOMECTOMY VAGINAL APPROACH     ROBOTIC ASSISTED TOTAL HYSTERECTOMY N/A 07/17/2012   Procedure: ROBOTIC ASSISTED TOTAL HYSTERECTOMY with Utero-sacral Ligament Suspension;  Surgeon: Princess Bruins, MD;  Location: [facility];   Service: Gynecology;  Laterality: N/A;    Family History  Problem Relation Age of Onset   Diabetes Mother    Hyperlipidemia Mother    Hypertension Mother    COPD Father    Lung cancer Brother        lung   Diabetes Maternal Grandfather    Cancer Daughter    Colon cancer Neg Hx      Current Outpatient Medications:    ibuprofen (ADVIL,MOTRIN) 200 MG tablet, Take 400 mg by mouth every 6 (six) hours as needed for moderate pain. , Disp: , Rfl:    lisinopril-hydrochlorothiazide (ZESTORETIC) 10-12.5 MG tablet, Take 0.5 tablets by mouth daily., Disp: 45 tablet, Rfl: 3   Multiple Vitamins-Minerals (WOMENS MULTI VITAMIN & MINERAL PO), Take 1 tablet by mouth 2 (two) times daily. , Disp: , Rfl:    simvastatin (ZOCOR) 5 MG tablet, Take 1 tablet (5 mg total) by mouth daily., Disp: 90 tablet, Rfl: 3   SitaGLIPtin-MetFORMIN HCl (JANUMET XR) [phone] MG TB24, Take 1 tablet by mouth daily., Disp: 90 tablet, Rfl: 1   TURMERIC PO, Take 3 tablets by mouth 3 (three) times daily. , Disp: , Rfl:   EXAM:  VITALS per patient if applicable: RR between 78-46 bpm  GENERAL: alert, oriented, appears well and in no acute distress  HEENT: atraumatic, conjunctiva clear, no obvious abnormalities on inspection of external nose and ears  NECK: normal movements of the head and neck  LUNGS: Intermittent dry cough.  Deep breathing induces coughing.  On inspection no signs of respiratory distress, breathing rate appears  normal, no obvious gross SOB, gasping or wheezing  CV: no obvious cyanosis  MS: moves all visible extremities without noticeable abnormality  PSYCH/NEURO: pleasant and cooperative, no obvious depression or anxiety, speech and thought processing grossly intact  ASSESSMENT AND PLAN:  Discussed the following assessment and plan:  Cough -Worsening -COVID testing negative x 5 throughout the month of June -Given continued cough and new fever concern for pneumonia.  Also consider atelectasis and  bronchitis -Discussed obtaining CXR at [facility] as x-ray unavailable at [facility] -We will start Augmentin twice daily and Tessalon -Patient encouraged to take deep breaths throughout the day for atelectasis - Plan: DG Chest 2 View, benzonatate (TESSALON) 100 MG capsule, amoxicillin-clavulanate (AUGMENTIN) 500-125 MG tablet  Postnasal drip -Reviewed proper nasal spray technique - Plan: fluticasone (FLONASE) 50 MCG/ACT nasal spray  Fever, unspecified -Continue supportive care including Tylenol and NSAIDs. -Patient advised she can alternate the Tylenol and NSAIDs -Discussed other supportive care -Given precautions  Follow-up as needed  I discussed the assessment and treatment plan with the patient. The patient was provided an opportunity to ask questions and all were answered. The patient agreed with the plan and demonstrated an understanding of the instructions.   The patient was advised to call back or seek an in-person evaluation if the symptoms worsen or if the condition fails to improve as anticipated.   Billie Ruddy, MD

## 2020-10-15 ENCOUNTER — Other Ambulatory Visit: Payer: Self-pay

## 2020-10-16 ENCOUNTER — Emergency Department (HOSPITAL_BASED_OUTPATIENT_CLINIC_OR_DEPARTMENT_OTHER): Payer: PPO

## 2020-10-16 ENCOUNTER — Encounter (HOSPITAL_BASED_OUTPATIENT_CLINIC_OR_DEPARTMENT_OTHER): Payer: Self-pay | Admitting: *Deleted

## 2020-10-16 ENCOUNTER — Emergency Department (HOSPITAL_BASED_OUTPATIENT_CLINIC_OR_DEPARTMENT_OTHER)
Admission: EM | Admit: 2020-10-16 | Discharge: 2020-10-16 | Disposition: A | Discharge status: Home / Self Care | Payer: PPO | Attending: Emergency Medicine | Admitting: Emergency Medicine

## 2020-10-16 DIAGNOSIS — R000 Tachycardia, unspecified: Secondary | ICD-10-CM | POA: Diagnosis not present

## 2020-10-16 DIAGNOSIS — E119 Type 2 diabetes mellitus without complications: Secondary | ICD-10-CM | POA: Diagnosis not present

## 2020-10-16 DIAGNOSIS — R110 Nausea: Secondary | ICD-10-CM | POA: Diagnosis not present

## 2020-10-16 DIAGNOSIS — R1011 Right upper quadrant pain: Secondary | ICD-10-CM | POA: Diagnosis not present

## 2020-10-16 DIAGNOSIS — Z7984 Long term (current) use of oral hypoglycemic drugs: Secondary | ICD-10-CM | POA: Diagnosis not present

## 2020-10-16 DIAGNOSIS — Z79899 Other long term (current) drug therapy: Secondary | ICD-10-CM | POA: Insufficient documentation

## 2020-10-16 DIAGNOSIS — Z20822 Contact with and (suspected) exposure to covid-19: Secondary | ICD-10-CM | POA: Insufficient documentation

## 2020-10-16 DIAGNOSIS — R5381 Other malaise: Secondary | ICD-10-CM | POA: Insufficient documentation

## 2020-10-16 DIAGNOSIS — R1013 Epigastric pain: Secondary | ICD-10-CM | POA: Insufficient documentation

## 2020-10-16 DIAGNOSIS — R509 Fever, unspecified: Secondary | ICD-10-CM | POA: Diagnosis not present

## 2020-10-16 DIAGNOSIS — R519 Headache, unspecified: Secondary | ICD-10-CM | POA: Insufficient documentation

## 2020-10-16 DIAGNOSIS — I10 Essential (primary) hypertension: Secondary | ICD-10-CM | POA: Insufficient documentation

## 2020-10-16 LAB — COMPREHENSIVE METABOLIC PANEL
ALT: 67 U/L — ABNORMAL HIGH (ref 0–44)
AST: 42 U/L — ABNORMAL HIGH (ref 15–41)
Albumin: 4.1 g/dL (ref 3.5–5.0)
Alkaline Phosphatase: 50 U/L (ref 38–126)
Anion gap: 13 (ref 5–15)
BUN: 18 mg/dL (ref 8–23)
CO2: 26 mmol/L (ref 22–32)
Calcium: 9.3 mg/dL (ref 8.9–10.3)
Chloride: 96 mmol/L — ABNORMAL LOW (ref 98–111)
Creatinine, Ser: 0.79 mg/dL (ref 0.44–1.00)
GFR, Estimated: >60 mL/min (ref >60)
Glucose, Bld: 126 mg/dL — ABNORMAL HIGH (ref 70–99)
Potassium: 3.1 mmol/L — ABNORMAL LOW (ref 3.5–5.1)
Sodium: 135 mmol/L (ref 135–145)
Total Bilirubin: 0.9 mg/dL (ref 0.3–1.2)
Total Protein: 6.4 g/dL — ABNORMAL LOW (ref 6.5–8.1)

## 2020-10-16 LAB — URINALYSIS, ROUTINE W REFLEX MICROSCOPIC
Bilirubin Urine: NEGATIVE
Glucose, UA: NEGATIVE mg/dL
Ketones, ur: 15 mg/dL — AB
Leukocytes,Ua: NEGATIVE
Nitrite: NEGATIVE
Specific Gravity, Urine: 1.02 (ref 1.005–1.030)
pH: 5.5 (ref 5.0–8.0)

## 2020-10-16 LAB — CBC
HCT: 39.8 % (ref 36.0–46.0)
Hemoglobin: 13.4 g/dL (ref 12.0–15.0)
MCH: 28.6 pg (ref 26.0–34.0)
MCHC: 33.7 g/dL (ref 30.0–36.0)
MCV: 84.9 fL (ref 80.0–100.0)
Platelets: 113 10*3/uL — ABNORMAL LOW (ref 150–400)
RBC: 4.69 MIL/uL (ref 3.87–5.11)
RDW: 13.2 % (ref 11.5–15.5)
WBC: 4.4 10*3/uL (ref 4.0–10.5)
nRBC: 0 % (ref 0.0–0.2)

## 2020-10-16 LAB — D-DIMER, QUANTITATIVE: D-Dimer, Quant: 3.49 ug/mL-FEU — ABNORMAL HIGH (ref 0.00–0.50)

## 2020-10-16 LAB — LIPASE, BLOOD: Lipase: 26 U/L (ref 11–51)

## 2020-10-16 MED ORDER — DROPERIDOL 2.5 MG/ML IJ SOLN
1.2500 mg | Freq: Once | INTRAMUSCULAR | Status: AC
  Administered 2020-10-16: 1.25 mg via INTRAVENOUS
  Filled 2020-10-16: qty 2

## 2020-10-16 MED ORDER — POTASSIUM CHLORIDE CRYS ER 20 MEQ PO TBCR
40.0000 meq | EXTENDED_RELEASE_TABLET | Freq: Once | ORAL | Status: AC
  Administered 2020-10-16: 40 meq via ORAL
  Filled 2020-10-16: qty 2

## 2020-10-16 MED ORDER — PROMETHAZINE HCL 25 MG PO TABS: 25.0000 mg | ORAL_TABLET | Freq: Four times a day (QID) | ORAL | 0 refills | Status: DC | PRN

## 2020-10-16 MED ORDER — ONDANSETRON HCL 4 MG/2ML IJ SOLN
4.0000 mg | Freq: Once | INTRAMUSCULAR | Status: AC
  Administered 2020-10-16: 4 mg via INTRAVENOUS
  Filled 2020-10-16: qty 2

## 2020-10-16 MED ORDER — IOHEXOL 350 MG/ML SOLN
75.0000 mL | Freq: Once | INTRAVENOUS | Status: AC | PRN
  Administered 2020-10-16: 75 mL via INTRAVENOUS

## 2020-10-16 MED ORDER — DOXYCYCLINE HYCLATE 100 MG PO CAPS: 100.0000 mg | ORAL_CAPSULE | Freq: Two times a day (BID) | ORAL | 0 refills | Status: DC

## 2020-10-16 NOTE — ED Triage Notes (Signed)
4 weeks ago cough, this week fever, virtual with PCP, chest xray Wed. (-) x ray, Amox-clav 500-125 mg then Nausea started. Benzonatate 100 mg given for cough.

## 2020-10-16 NOTE — ED Notes (Signed)
Patient continues to have nausea.  Zofran given

## 2020-10-16 NOTE — Discharge Instructions (Addendum)
Please follow-up with your family doctor in the next 3 to 5 days to have your labs rechecked.   Today your liver enzymes and platelets were slightly abnormal.   Stop taking the amoxicillin.   Start taking the doxycycline. Stay out of the sun. The doxycycline is for a possible tickborne illness.   Drink plenty of fluids. Get rechecked if you have new or concerning symptoms.

## 2020-10-16 NOTE — ED Notes (Signed)
Pt drinking water and has kept it down. RN Zenia Resides informed.

## 2020-10-16 NOTE — ED Provider Notes (Signed)
[facility] EMERGENCY DEPT Provider Note   CSN: [id number] Arrival date & time: 10/16/20  1045     History Chief Complaint  Patient presents with   Nausea    Amanda Eaton is a 65 y.o. female.  The history is provided by the patient and medical records.  Amanda Eaton is a 65 y.o. female who presents to the Emergency Department complaining of nausea.  Four weeks ago she developed a dry cough with post nasal drip.  Developed fever to 102 on fever.  Had a virtual visit and was started on augmentin and had an xray.  Started augmentin on Wednesday.  Nausea started on Thursday - mlid in nature.  Today nausea is significantly worse.  Overall cough is improving.  She is vomiting since yesterday.  Last fever was two days ago.    No sore throat, chest pain.  Has some right sided back pain for the last week - mid back.  Has some mild epigastric pain/tightness for two days, worse when lying supine.  No dysuria.  No leg swelling/pain. Has mild headache. No known tick bites. No rashes.  Does not take hormones.  No hx/o DVT/PE.       Past Medical History:  Diagnosis Date   DIABETES MELLITUS, TYPE II 11/23/2006   HYPERLIPIDEMIA, BORDERLINE 04/08/2008   HYPERTENSION 11/23/2006   WEIGHT GAIN 10/12/2008    Patient Active Problem List   Diagnosis Date Noted   Acute pain of right knee 03/15/2016   S/P hysterectomy 07/18/2012   WEIGHT GAIN 10/12/2008   Hyperlipidemia 04/08/2008   Diabetes mellitus type 2 in obese ([place]) 11/23/2006   Essential hypertension 11/23/2006    Past Surgical History:  Procedure Laterality Date   ABDOMINAL HYSTERECTOMY     ANTERIOR AND POSTERIOR REPAIR N/A 07/17/2012   Procedure: ANTERIOR (CYSTOCELE) AND POSTERIOR REPAIR (RECTOCELE);  Surgeon: Princess Bruins, MD;  Location: [facility];  Service: Gynecology;  Laterality: N/A;   CATARACT EXTRACTION  04/24/2013   HYSTEROSCOPY W/ ENDOMETRIAL ABLATION     1987   MYOMECTOMY VAGINAL APPROACH     ROBOTIC ASSISTED  TOTAL HYSTERECTOMY N/A 07/17/2012   Procedure: ROBOTIC ASSISTED TOTAL HYSTERECTOMY with Utero-sacral Ligament Suspension;  Surgeon: Princess Bruins, MD;  Location: [facility];  Service: Gynecology;  Laterality: N/A;     OB History     Gravida  3   Para  3   Term  3   Preterm      AB      Living  3      SAB      IAB      Ectopic      Multiple      Live Births              Family History  Problem Relation Age of Onset   Diabetes Mother    Hyperlipidemia Mother    Hypertension Mother    COPD Father    Lung cancer Brother        lung   Diabetes Maternal Grandfather    Cancer Daughter    Colon cancer Neg Hx     Social History   Tobacco Use   Smoking status: Never   Smokeless tobacco: Never  Vaping Use   Vaping Use: Never used  Substance Use Topics   Alcohol use: No    Alcohol/week: 0.0 standard drinks   Drug use: No    Home Medications Prior to Admission medications   Medication Sig Start Date End Date Taking?  Authorizing Provider  amoxicillin-clavulanate (AUGMENTIN) 500-125 MG tablet Take 1 tablet (500 mg total) by mouth in the morning and at bedtime for 7 days. 10/13/20 10/20/20 Yes Billie Ruddy, MD  benzonatate (TESSALON) 100 MG capsule Take 1 capsule (100 mg total) by mouth 2 (two) times daily as needed for cough. 10/13/20  Yes Billie Ruddy, MD  fluticasone (FLONASE) 50 MCG/ACT nasal spray Place 1 spray into both nostrils daily. 10/13/20  Yes Billie Ruddy, MD  ibuprofen (ADVIL,MOTRIN) 200 MG tablet Take 400 mg by mouth every 6 (six) hours as needed for moderate pain.    Yes [provider]  lisinopril-hydrochlorothiazide (ZESTORETIC) 10-12.5 MG tablet Take 0.5 tablets by mouth daily. 12/16/19  Yes Nafziger, Tommi Rumps, NP  Multiple Vitamins-Minerals (WOMENS MULTI VITAMIN & MINERAL PO) Take 1 tablet by mouth 2 (two) times daily.    Yes [provider]  simvastatin (ZOCOR) 5 MG tablet Take 1 tablet (5 mg total) by mouth daily.  12/16/19  Yes Nafziger, Tommi Rumps, NP  SitaGLIPtin-MetFORMIN HCl (JANUMET XR) [phone] MG TB24 Take 1 tablet by mouth daily. 06/22/20  Yes Nafziger, Tommi Rumps, NP  TURMERIC PO Take 3 tablets by mouth 3 (three) times daily.    Yes [provider]    Allergies    Patient has no known allergies.  Review of Systems   Review of Systems  All other systems reviewed and are negative.  Physical Exam Updated Vital Signs BP 115/80 (BP Location: Right Arm)   Pulse 97   Temp 98.8 F (37.1 C) (Oral)   Resp 18   Ht 5\' 6"  (1.676 m)   Wt 88.5 kg   SpO2 98%   BMI 31.47 kg/m   Physical Exam Vitals and nursing note reviewed.  Constitutional:      Appearance: She is well-developed.  HENT:     Head: Normocephalic and atraumatic.  Cardiovascular:     Rate and Rhythm: Normal rate and regular rhythm.     Heart sounds: No murmur heard. Pulmonary:     Effort: Pulmonary effort is normal. No respiratory distress.     Breath sounds: Normal breath sounds.  Abdominal:     Palpations: Abdomen is soft.     Tenderness: There is no guarding or rebound.     Comments: Mild upper abdominal tenderness  Musculoskeletal:        General: No tenderness.  Skin:    General: Skin is warm and dry.  Neurological:     Mental Status: She is alert and oriented to person, place, and time.  Psychiatric:        Behavior: Behavior normal.    ED Results / Procedures / Treatments   Labs (all labs ordered are listed, but only abnormal results are displayed) Labs Reviewed  LIPASE, BLOOD  COMPREHENSIVE METABOLIC PANEL  CBC  URINALYSIS, ROUTINE W REFLEX MICROSCOPIC    EKG None  Radiology No results found.  Procedures Procedures   Medications Ordered in ED Medications - No data to display  ED Course  I have reviewed the triage vital signs and the nursing notes.  Pertinent labs & imaging results that were available during my care of the patient were reviewed by me and considered in my medical decision making  (see chart for details).    MDM Rules/Calculators/A&P                         Patient here for evaluation of nausea, malaise and fevers. Last fever  was two days ago. She initially started feeling ill about four weeks ago with cough, which is overall improving. She is non-toxic appearing on evaluation. Labs with mild thrombocytopenia, mild elevation in transaminases. No evidence of acute infectious process at this time. Presentation is not consistent with meningitis, subarachnoid hemorrhage, septic embolism. Because of her right sided pain and cough a D dimer was obtained, which is elevated. CTA was obtained, which is negative for PE or pneumonia. In terms of her nausea, source of this is unclear. This may be secondary to an underlying illness versus related to recent antibiotic. Recommend discontinuing Augmentin at this time. Given her lab profile and the fact that she is out sided times in the garden will start doxycycline for possible RMSF. Discussed with patient importance of PCP follow-up for repeat evaluation as well as return precautions for progressive or concerning symptoms.  Final Clinical Impression(s) / ED Diagnoses Final diagnoses:  None    Rx / DC Orders ED Discharge Orders     None        Quintella Reichert, MD 10/16/20 1527

## 2020-10-17 LAB — SARS CORONAVIRUS 2 (TAT 6-24 HRS): SARS Coronavirus 2: NEGATIVE

## 2020-10-21 ENCOUNTER — Telehealth (INDEPENDENT_AMBULATORY_CARE_PROVIDER_SITE_OTHER): Payer: PPO | Admitting: Internal Medicine

## 2020-10-21 ENCOUNTER — Other Ambulatory Visit: Payer: Self-pay

## 2020-10-21 DIAGNOSIS — E1169 Type 2 diabetes mellitus with other specified complication: Secondary | ICD-10-CM | POA: Diagnosis not present

## 2020-10-21 DIAGNOSIS — E669 Obesity, unspecified: Secondary | ICD-10-CM

## 2020-10-21 DIAGNOSIS — R509 Fever, unspecified: Secondary | ICD-10-CM

## 2020-10-21 DIAGNOSIS — I10 Essential (primary) hypertension: Secondary | ICD-10-CM

## 2020-10-21 DIAGNOSIS — R112 Nausea with vomiting, unspecified: Secondary | ICD-10-CM

## 2020-10-21 NOTE — Progress Notes (Signed)
Virtual Visit via Video Note  I connected withNAME@ on 10/21/20 at  9:30 AM EDT by a video enabled telemedicine application and verified that I am speaking with the correct person using two identifiers. Location patient: home Location provider:home office Persons participating in the virtual visit: patient, provider  [place] national recommendations  regarding COVID 19 pandemic   video visit is advised over in office visit for this patient.  Patient aware  of the limitations of evaluation and management by telemedicine and  availability of in person appointments. and agreed to proceed.   HPI: Amanda Eaton presents for video visit On going illness  seen 6 22 dr banks  cough rx with augmentin  6/25 to ed nausea   elevated d dime low platelets  ,mild transaminitis  neg cta and abd [place]  changed to doxy in case tick related illness  , then IV fluids IV Phenergan and potassium because of a potassium of 3.1. She had stopped her medications when she was sick Janumet and blood pressure meds.  She did not begin the doxycycline because her in about side effects with her stomach being on statin. No one else got sick Fever went away for the last few days until last night did have a temp of 101.1 taking Tylenol. She was able to eat half a tuna sandwich and a little bit of oatmeal yesterday last night tried 1 bite of a steak salad and threw up. Since then she has been using Gatorade and keeping it down quite well and feels like she is maintaining her fluids. No rash   Covid negative  ROS: See pertinent positives and negatives per HPI.  Past Medical History:  Diagnosis Date   DIABETES MELLITUS, TYPE II 11/23/2006   HYPERLIPIDEMIA, BORDERLINE 04/08/2008   HYPERTENSION 11/23/2006   WEIGHT GAIN 10/12/2008    Past Surgical History:  Procedure Laterality Date   ABDOMINAL HYSTERECTOMY     ANTERIOR AND POSTERIOR REPAIR N/A 07/17/2012   Procedure: ANTERIOR (CYSTOCELE) AND POSTERIOR REPAIR (RECTOCELE);   Surgeon: Princess Bruins, MD;  Location: [facility];  Service: Gynecology;  Laterality: N/A;   CATARACT EXTRACTION  04/24/2013   HYSTEROSCOPY W/ ENDOMETRIAL ABLATION     1987   MYOMECTOMY VAGINAL APPROACH     ROBOTIC ASSISTED TOTAL HYSTERECTOMY N/A 07/17/2012   Procedure: ROBOTIC ASSISTED TOTAL HYSTERECTOMY with Utero-sacral Ligament Suspension;  Surgeon: Princess Bruins, MD;  Location: [facility];  Service: Gynecology;  Laterality: N/A;    Family History  Problem Relation Age of Onset   Diabetes Mother    Hyperlipidemia Mother    Hypertension Mother    COPD Father    Lung cancer Brother        lung   Diabetes Maternal Grandfather    Cancer Daughter    Colon cancer Neg Hx     Social History   Tobacco Use   Smoking status: Never   Smokeless tobacco: Never  Vaping Use   Vaping Use: Never used  Substance Use Topics   Alcohol use: No    Alcohol/week: 0.0 standard drinks   Drug use: No      Current Outpatient Medications:    benzonatate (TESSALON) 100 MG capsule, Take 1 capsule (100 mg total) by mouth 2 (two) times daily as needed for cough., Disp: 20 capsule, Rfl: 0   fluticasone (FLONASE) 50 MCG/ACT nasal spray, Place 1 spray into both nostrils daily., Disp: 16 g, Rfl: 0   ibuprofen (ADVIL,MOTRIN) 200 MG tablet, Take 400 mg by mouth every  6 (six) hours as needed for moderate pain. , Disp: , Rfl:    lisinopril-hydrochlorothiazide (ZESTORETIC) 10-12.5 MG tablet, Take 0.5 tablets by mouth daily., Disp: 45 tablet, Rfl: 3   Multiple Vitamins-Minerals (WOMENS MULTI VITAMIN & MINERAL PO), Take 1 tablet by mouth 2 (two) times daily. , Disp: , Rfl:    promethazine (PHENERGAN) 25 MG tablet, Take 1 tablet (25 mg total) by mouth every 6 (six) hours as needed for nausea or vomiting., Disp: 12 tablet, Rfl: 0   simvastatin (ZOCOR) 5 MG tablet, Take 1 tablet (5 mg total) by mouth daily., Disp: 90 tablet, Rfl: 3   SitaGLIPtin-MetFORMIN HCl (JANUMET XR) [phone] MG TB24, Take 1 tablet by mouth  daily., Disp: 90 tablet, Rfl: 1   TURMERIC PO, Take 3 tablets by mouth 3 (three) times daily. , Disp: , Rfl:   EXAM: BP Readings from Last 3 Encounters:  10/16/20 (!) 108/55  09/07/20 110/72  06/22/20 139/85    VITALS per patient if applicable:  GENERAL: alert, oriented, appears well and in no acute distress non icteric alert non toxic   HEENT: atraumatic, conjunttiva clear, no obvious abnormalities on inspection of external nose and ears  NECK: normal movements of the head and neck  LUNGS: on inspection no signs of respiratory distress, breathing rate appears normal, no obvious gross SOB, gasping or wheezing  CV: no obvious cyanosis  MS: moves all visible extremities without noticeable abnormality  PSYCH/NEURO: pleasant and cooperative, no obvious depression or anxiety, speech and thought processing grossly intact Lab Results  Component Value Date   WBC 4.4 10/16/2020   HGB 13.4 10/16/2020   HCT 39.8 10/16/2020   PLT 113 (L) 10/16/2020   GLUCOSE 126 (H) 10/16/2020   CHOL 185 06/22/2020   TRIG 64.0 06/22/2020   HDL 70.90 06/22/2020   LDLDIRECT 158.8 10/04/2010   LDLCALC 101 (H) 06/22/2020   ALT 67 (H) 10/16/2020   AST 42 (H) 10/16/2020   NA 135 10/16/2020   K 3.1 (L) 10/16/2020   CL 96 (L) 10/16/2020   CREATININE 0.79 10/16/2020   BUN 18 10/16/2020   CO2 26 10/16/2020   TSH 1.47 12/16/2019   HGBA1C 6.3 06/22/2020   MICROALBUR 1.8 05/07/2015  Record  ed review   ASSESSMENT AND PLAN:  Discussed the following assessment and plan:    ICD-10-CM   1. Acute febrile illness  R50.9    covid neg low platelets s transaminitis    2. Non-intractable vomiting with nausea, unspecified vomiting type  R11.2     3. Diabetes mellitus type 2 in obese (HCC)  E11.69    E66.9     4. Essential hypertension  I10      Prolonged febrile illness with mild thrombocytopenia and transaminitis after Augmentin empiric treatment COVID-negative increased D-dimer negative CTA for  pneumonia or clot and abdominal ultrasound unrevealing Underlying diabetes hypertension. Hydration appears adequate although food intake is minimal but improved. Some concern about temp of 101 last night. Explained the rationale for doxycycline for "summer flu" poss stick related infection  ( rmsf  etc) with her presentation. If her fever ireturns todnight elevated 101+ tonight  low threshold to start  doxycycline premidicate with Phenergan promethazine before taking for empiric treatment for tick related disease. She should contact on-call if any rash worsening reevaluation. Make a follow-up appointment with Georgina Snell next week in person will need follow-up labs eventually. Counseled.   Expectant management and discussion of plan and treatment with opportunity to ask questions and all  were answered. The patient agreed with the plan and demonstrated an understanding of the instructions.  32 minutes  review assess and counsel and plan   Advised to call back or seek an in-person evaluation if worsening  or having  further concerns . Return for next week  with PCP . Shanon Ace, MD

## 2020-10-26 ENCOUNTER — Other Ambulatory Visit: Payer: Self-pay

## 2020-10-27 ENCOUNTER — Encounter: Payer: Self-pay | Admitting: Adult Health

## 2020-10-27 ENCOUNTER — Ambulatory Visit (INDEPENDENT_AMBULATORY_CARE_PROVIDER_SITE_OTHER): Payer: PPO | Admitting: Adult Health

## 2020-10-27 VITALS — HR 89 | Wt 183.0 lb

## 2020-10-27 DIAGNOSIS — E860 Dehydration: Secondary | ICD-10-CM

## 2020-10-27 DIAGNOSIS — E1169 Type 2 diabetes mellitus with other specified complication: Secondary | ICD-10-CM | POA: Diagnosis not present

## 2020-10-27 DIAGNOSIS — R509 Fever, unspecified: Secondary | ICD-10-CM

## 2020-10-27 DIAGNOSIS — B349 Viral infection, unspecified: Secondary | ICD-10-CM

## 2020-10-27 DIAGNOSIS — R112 Nausea with vomiting, unspecified: Secondary | ICD-10-CM

## 2020-10-27 DIAGNOSIS — E669 Obesity, unspecified: Secondary | ICD-10-CM | POA: Diagnosis not present

## 2020-10-27 LAB — CBC WITH DIFFERENTIAL/PLATELET
Basophils Absolute: 0 10*3/uL (ref 0.0–0.1)
Basophils Relative: 0.7 % (ref 0.0–3.0)
Eosinophils Absolute: 0 10*3/uL (ref 0.0–0.7)
Eosinophils Relative: 0.6 % (ref 0.0–5.0)
HCT: 36.8 % (ref 36.0–46.0)
Hemoglobin: 12.6 g/dL (ref 12.0–15.0)
Lymphocytes Relative: 31.8 % (ref 12.0–46.0)
Lymphs Abs: 1.5 10*3/uL (ref 0.7–4.0)
MCHC: 34.3 g/dL (ref 30.0–36.0)
MCV: 85.6 fl (ref 78.0–100.0)
Monocytes Absolute: 0.6 10*3/uL (ref 0.1–1.0)
Monocytes Relative: 13.3 % — ABNORMAL HIGH (ref 3.0–12.0)
Neutro Abs: 2.5 10*3/uL (ref 1.4–7.7)
Neutrophils Relative %: 53.6 % (ref 43.0–77.0)
Platelets: 287 10*3/uL (ref 150.0–400.0)
RBC: 4.3 Mil/uL (ref 3.87–5.11)
RDW: 14.7 % (ref 11.5–15.5)
WBC: 4.6 10*3/uL (ref 4.0–10.5)

## 2020-10-27 MED ORDER — SUCRALFATE 1 G PO TABS: 1.0000 g | ORAL_TABLET | Freq: Three times a day (TID) | ORAL | 0 refills | Status: DC

## 2020-10-27 NOTE — Progress Notes (Signed)
Subjective:    Patient ID: Amanda Eaton, female    DOB: 28-Aug-1955, 64 y.o.   MRN: [id number]  HPI 65 year old female who  has a past medical history of DIABETES MELLITUS, TYPE II (11/23/2006), HYPERLIPIDEMIA, BORDERLINE (04/08/2008), HYPERTENSION (11/23/2006), and WEIGHT GAIN (10/12/2008).  She presents to the office today for follow-up after being seen virtually by another provider in the office on 10/21/2020.  Prior to that she was seen by a different provider on 10/13/2020 with ongoing illness with cough.  She was prescribed Augmentin.  On 10/16/2020 she went to the emergency room with nausea.  Work-up revealed an elevated D-dimer, low platelets, mild elevation in transaminase, negative CTA and abdominal ultrasound.  Antibiotics were changed to Doxy in case of tick related illness.  On 10/21/2020 she had not started the doxycycline yet because she read about side effects with her stomach and being on a statin.  She was seen on 10/21/2020 her fever went away until the night prior where she had a temp of 101 while taking Tylenol.  Today she reports she did not start the doxycycline.  She has not noticed any rashes throughout her body.  Dry heaves-vomiting/nausea resolved yesterday, she has not had a fever in 6 days and she was able to eat a little bit of food yesterday without any issues.  She continues to feel fatigued and extremely weak and has some mild discomfort in her upper abdomen at times.   Blood sugars at home have been in the 150s to 160s, she has not been taking any of her medication since she felt ill because of decreased p.o. intake   Review of Systems See HPI   Past Medical History:  Diagnosis Date   DIABETES MELLITUS, TYPE II 11/23/2006   HYPERLIPIDEMIA, BORDERLINE 04/08/2008   HYPERTENSION 11/23/2006   WEIGHT GAIN 10/12/2008    Social History   Socioeconomic History   Marital status: Married    Spouse name: Not on file   Number of children: Not on file   Years of education:  Not on file   Highest education level: Not on file  Occupational History   Not on file  Tobacco Use   Smoking status: Never   Smokeless tobacco: Never  Vaping Use   Vaping Use: Never used  Substance and Sexual Activity   Alcohol use: No    Alcohol/week: 0.0 standard drinks   Drug use: No   Sexual activity: Yes    Partners: Male    Comment: 1st intercourse- 45, married- 43 yrs   Other Topics Concern   Not on file  Social History Narrative   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 All live locally    Social Determinants of Health   Financial Resource Strain: Not on file  Food Insecurity: Not on file  Transportation Needs: Not on file  Physical Activity: Not on file  Stress: Not on file  Social Connections: Not on file  Intimate Partner Violence: Not on file    Past Surgical History:  Procedure Laterality Date   ABDOMINAL HYSTERECTOMY     ANTERIOR AND POSTERIOR REPAIR N/A 07/17/2012   Procedure: ANTERIOR (CYSTOCELE) AND POSTERIOR REPAIR (RECTOCELE);  Surgeon: Princess Bruins, MD;  Location: [facility];  Service: Gynecology;  Laterality: N/A;   CATARACT EXTRACTION  04/24/2013   HYSTEROSCOPY W/ ENDOMETRIAL ABLATION     1987   MYOMECTOMY VAGINAL  APPROACH     ROBOTIC ASSISTED TOTAL HYSTERECTOMY N/A 07/17/2012   Procedure: ROBOTIC ASSISTED TOTAL HYSTERECTOMY with Utero-sacral Ligament Suspension;  Surgeon: Princess Bruins, MD;  Location: [facility];  Service: Gynecology;  Laterality: N/A;    Family History  Problem Relation Age of Onset   Diabetes Mother    Hyperlipidemia Mother    Hypertension Mother    COPD Father    Lung cancer Brother        lung   Diabetes Maternal Grandfather    Cancer Daughter    Colon cancer Neg Hx     No Known Allergies  Current Outpatient Medications on File Prior to Visit  Medication Sig Dispense Refill   benzonatate (TESSALON) 100 MG capsule Take 1 capsule (100 mg total)  by mouth 2 (two) times daily as needed for cough. 20 capsule 0   fluticasone (FLONASE) 50 MCG/ACT nasal spray Place 1 spray into both nostrils daily. 16 g 0   ibuprofen (ADVIL,MOTRIN) 200 MG tablet Take 400 mg by mouth every 6 (six) hours as needed for moderate pain.      lisinopril-hydrochlorothiazide (ZESTORETIC) 10-12.5 MG tablet Take 0.5 tablets by mouth daily. 45 tablet 3   Multiple Vitamins-Minerals (WOMENS MULTI VITAMIN & MINERAL PO) Take 1 tablet by mouth 2 (two) times daily.      promethazine (PHENERGAN) 25 MG tablet Take 1 tablet (25 mg total) by mouth every 6 (six) hours as needed for nausea or vomiting. 12 tablet 0   simvastatin (ZOCOR) 5 MG tablet Take 1 tablet (5 mg total) by mouth daily. 90 tablet 3   SitaGLIPtin-MetFORMIN HCl (JANUMET XR) [phone] MG TB24 Take 1 tablet by mouth daily. 90 tablet 1   TURMERIC PO Take 3 tablets by mouth 3 (three) times daily.      No current facility-administered medications on file prior to visit.    There were no vitals taken for this visit.      Objective:   Physical Exam Vitals and nursing note reviewed.  Constitutional:      Appearance: Normal appearance.     Comments: Appears tired but well    HENT:     Mouth/Throat:     Mouth: Mucous membranes are dry.  Cardiovascular:     Rate and Rhythm: Normal rate and regular rhythm.     Pulses: Normal pulses.     Heart sounds: Normal heart sounds.  Pulmonary:     Effort: Pulmonary effort is normal.     Breath sounds: Normal breath sounds.  Abdominal:     General: Abdomen is flat. Bowel sounds are normal.     Palpations: Abdomen is soft.     Tenderness: There is abdominal tenderness in the epigastric area.  Musculoskeletal:        General: Normal range of motion.  Skin:    General: Skin is warm and dry.     Capillary Refill: Capillary refill takes less than 2 seconds.  Neurological:     General: No focal deficit present.     Mental Status: She is alert and oriented to person,  place, and time.  Psychiatric:        Mood and Affect: Mood normal.        Behavior: Behavior normal.        Thought Content: Thought content normal.        Judgment: Judgment normal.      Assessment & Plan:  1. Acute febrile illness -Thankfully her symptoms are starting to resolve, we will  repeat labs and also check for tickborne illness.  She was encouraged to start with a bland diet and aggressive as tolerated.  - Follow up PRN  - CBC with Differential/Platelet; Future - CMP with eGFR([facility]); Future - Rocky mtn spotted fvr abs pnl(IgG+IgM); Future - B. burgdorfi Antibody; Future - sucralfate (CARAFATE) 1 g tablet; Take 1 tablet (1 g total) by mouth 4 (four) times daily -  with meals and at bedtime for 5 days.  Dispense: 20 tablet; Refill: 0 - CBC with Differential/Platelet - CMP with eGFR([facility]) - Rocky mtn spotted fvr abs pnl(IgG+IgM) - B. burgdorfi Antibody  2. Non-intractable vomiting with nausea, unspecified vomiting type - Encouraged bland diet. Carafate prescribed  3. Diabetes mellitus type 2 in obese ([place]) - Advance diet  - start back on diabetic medication   4. Dehydration - Advised Pedialyte - Follow up if no improvement   Dorothyann Peng, NP

## 2020-10-29 ENCOUNTER — Telehealth: Payer: Self-pay | Admitting: Adult Health

## 2020-10-29 NOTE — Telephone Encounter (Signed)
Pt call and stated she is returning your call and want a call back at [phone].

## 2020-10-29 NOTE — Telephone Encounter (Signed)
Pt was returning Cory's call but stated he called her back already. No further action needed!

## 2020-11-02 ENCOUNTER — Telehealth: Payer: Self-pay | Admitting: Adult Health

## 2020-11-02 LAB — B. BURGDORFI ANTIBODIES BY WB
B burgdorferi IgG Abs (IB): NEGATIVE
B burgdorferi IgM Abs (IB): POSITIVE — AB
Lyme Disease 18 kD IgG: NONREACTIVE
Lyme Disease 23 kD IgG: NONREACTIVE
Lyme Disease 23 kD IgM: REACTIVE — AB
Lyme Disease 28 kD IgG: REACTIVE — AB
Lyme Disease 30 kD IgG: NONREACTIVE
Lyme Disease 39 kD IgG: NONREACTIVE
Lyme Disease 39 kD IgM: REACTIVE — AB
Lyme Disease 41 kD IgG: REACTIVE — AB
Lyme Disease 41 kD IgM: NONREACTIVE
Lyme Disease 45 kD IgG: NONREACTIVE
Lyme Disease 58 kD IgG: REACTIVE — AB
Lyme Disease 66 kD IgG: NONREACTIVE
Lyme Disease 93 kD IgG: NONREACTIVE

## 2020-11-02 LAB — COMPLETE METABOLIC PANEL WITH GFR
AG Ratio: 1.2 (calc) (ref 1.0–2.5)
ALT: 41 U/L — ABNORMAL HIGH (ref 6–29)
AST: 27 U/L (ref 10–35)
Albumin: 3.9 g/dL (ref 3.6–5.1)
Alkaline phosphatase (APISO): 48 U/L (ref 37–153)
BUN/Creatinine Ratio: 30 (calc) — ABNORMAL HIGH (ref 6–22)
BUN: 39 mg/dL — ABNORMAL HIGH (ref 7–25)
CO2: 28 mmol/L (ref 20–32)
Calcium: 10 mg/dL (ref 8.6–10.4)
Chloride: 97 mmol/L — ABNORMAL LOW (ref 98–110)
Creat: 1.29 mg/dL — ABNORMAL HIGH (ref 0.50–0.99)
GFR, Est African American: 50 mL/min/{1.73_m2} — ABNORMAL LOW (ref >=60)
GFR, Est Non African American: 43 mL/min/{1.73_m2} — ABNORMAL LOW (ref >=60)
Globulin: 3.2 g/dL (calc) (ref 1.9–3.7)
Glucose, Bld: 132 mg/dL — ABNORMAL HIGH (ref 65–99)
Potassium: 3.4 mmol/L — ABNORMAL LOW (ref 3.5–5.3)
Sodium: 139 mmol/L (ref 135–146)
Total Bilirubin: 1.1 mg/dL (ref 0.2–1.2)
Total Protein: 7.1 g/dL (ref 6.1–8.1)

## 2020-11-02 LAB — ROCKY MTN SPOTTED FVR ABS PNL(IGG+IGM)
RMSF IgG: NOT DETECTED
RMSF IgM: NOT DETECTED

## 2020-11-02 LAB — LYME AB SCREEN %: Lyme AB Screen: 0.99 index — ABNORMAL HIGH

## 2020-11-02 NOTE — Telephone Encounter (Signed)
Pt notified tha tlabs are not resulted but we will call as soon as he results them. Pt verbalized understanding.

## 2020-11-02 NOTE — Telephone Encounter (Signed)
Patient is calling for lab results. She seen them on her MyChart but doesn't understand them.  She is hoping for a call back today so she will know what her next steps are.

## 2020-11-03 ENCOUNTER — Telehealth: Payer: Self-pay | Admitting: Adult Health

## 2020-11-03 MED ORDER — DOXYCYCLINE HYCLATE 100 MG PO CAPS: 100.0000 mg | ORAL_CAPSULE | Freq: Two times a day (BID) | ORAL | 0 refills | Status: AC

## 2020-11-03 NOTE — Telephone Encounter (Signed)
Updated patient on her WB for Lyme. IGM reactive. Will treat with doxycyline precautionary measures.  She still is feeling weak and is not able to eat much.  She was given a 7-day course in the emergency room but never started this.  We will send an additional 14-day.  Follow-up directions advised

## 2020-11-19 DIAGNOSIS — Z1231 Encounter for screening mammogram for malignant neoplasm of breast: Secondary | ICD-10-CM | POA: Diagnosis not present

## 2020-11-19 LAB — HM MAMMOGRAPHY

## 2020-11-26 ENCOUNTER — Ambulatory Visit: Payer: PPO

## 2020-11-30 ENCOUNTER — Other Ambulatory Visit: Payer: Self-pay

## 2020-12-01 ENCOUNTER — Encounter: Payer: Self-pay | Admitting: Adult Health

## 2020-12-01 ENCOUNTER — Ambulatory Visit (INDEPENDENT_AMBULATORY_CARE_PROVIDER_SITE_OTHER): Payer: PPO | Admitting: Adult Health

## 2020-12-01 VITALS — BP 108/62 | HR 87 | Temp 98.5°F | Ht 66.0 in | Wt 187.0 lb

## 2020-12-01 DIAGNOSIS — E1169 Type 2 diabetes mellitus with other specified complication: Secondary | ICD-10-CM | POA: Diagnosis not present

## 2020-12-01 DIAGNOSIS — A6920 Lyme disease, unspecified: Secondary | ICD-10-CM | POA: Diagnosis not present

## 2020-12-01 DIAGNOSIS — E669 Obesity, unspecified: Secondary | ICD-10-CM

## 2020-12-01 LAB — COMPREHENSIVE METABOLIC PANEL
ALT: 26 U/L (ref 0–35)
AST: 22 U/L (ref 0–37)
Albumin: 3.7 g/dL (ref 3.5–5.2)
Alkaline Phosphatase: 59 U/L (ref 39–117)
BUN: 25 mg/dL — ABNORMAL HIGH (ref 6–23)
CO2: 28 mEq/L (ref 19–32)
Calcium: 8.9 mg/dL (ref 8.4–10.5)
Chloride: 104 mEq/L (ref 96–112)
Creatinine, Ser: 0.92 mg/dL (ref 0.40–1.20)
GFR: 64.97 mL/min (ref >60.00)
Glucose, Bld: 98 mg/dL (ref 70–99)
Potassium: 3.6 mEq/L (ref 3.5–5.1)
Sodium: 142 mEq/L (ref 135–145)
Total Bilirubin: 0.6 mg/dL (ref 0.2–1.2)
Total Protein: 6.1 g/dL (ref 6.0–8.3)

## 2020-12-01 LAB — HEMOGLOBIN A1C: Hgb A1c MFr Bld: 6.4 % (ref 4.6–6.5)

## 2020-12-01 NOTE — Progress Notes (Signed)
Subjective:    Patient ID: Amanda Eaton, female    DOB: [birth year], 65 y.o.   MRN: [id number]  HPI 65 year old female who  has a past medical history of DIABETES MELLITUS, TYPE II (11/23/2006), HYPERLIPIDEMIA, BORDERLINE (04/08/2008), HYPERTENSION (11/23/2006), and WEIGHT GAIN (10/12/2008).  She Zentz to the office today for 1 month follow-up.  In July she was treated for Lyme disease with 21 days of doxycycline.  At this time her symptoms included fever up to 100 degrees while taking Tylenol,fatigue, decreased appetite, dehydration, and nausea/vomiting.  Since finishing her antibiotic course she feels much improved.  She is back to taking all of her medications as directed.  Her appetite has improved, she is no longer feeling fatigued, having any episodes of nausea or vomiting and feels " back to normal"   She is also due for an A1c check.  Her diabetes is currently managed Janumet [phone] mg daily.  She has been well controlled on this medication in the past. She has been monitoring her BS at home and reports that her blood sugars are in the 100-113's.   Lab Results  Component Value Date   HGBA1C 6.3 06/22/2020   Review of Systems See HPI   Past Medical History:  Diagnosis Date   DIABETES MELLITUS, TYPE II 11/23/2006   HYPERLIPIDEMIA, BORDERLINE 04/08/2008   HYPERTENSION 11/23/2006   WEIGHT GAIN 10/12/2008    Social History   Socioeconomic History   Marital status: Married    Spouse name: Not on file   Number of children: Not on file   Years of education: Not on file   Highest education level: Not on file  Occupational History   Not on file  Tobacco Use   Smoking status: Never   Smokeless tobacco: Never  Vaping Use   Vaping Use: Never used  Substance and Sexual Activity   Alcohol use: No    Alcohol/week: 0.0 standard drinks   Drug use: No   Sexual activity: Yes    Partners: Male    Comment: 1st intercourse- 69, married- 84 yrs   Other Topics Concern   Not on file   Social History Narrative   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 All live locally    Social Determinants of Health   Financial Resource Strain: Not on file  Food Insecurity: Not on file  Transportation Needs: Not on file  Physical Activity: Not on file  Stress: Not on file  Social Connections: Not on file  Intimate Partner Violence: Not on file    Past Surgical History:  Procedure Laterality Date   ABDOMINAL HYSTERECTOMY     ANTERIOR AND POSTERIOR REPAIR N/A 07/17/2012   Procedure: ANTERIOR (CYSTOCELE) AND POSTERIOR REPAIR (RECTOCELE);  Surgeon: Princess Bruins, MD;  Location: [facility];  Service: Gynecology;  Laterality: N/A;   CATARACT EXTRACTION  04/24/2013   HYSTEROSCOPY W/ ENDOMETRIAL ABLATION     1987   MYOMECTOMY VAGINAL APPROACH     ROBOTIC ASSISTED TOTAL HYSTERECTOMY N/A 07/17/2012   Procedure: ROBOTIC ASSISTED TOTAL HYSTERECTOMY with Utero-sacral Ligament Suspension;  Surgeon: Princess Bruins, MD;  Location: [facility];  Service: Gynecology;  Laterality: N/A;    Family History  Problem Relation Age of Onset   Diabetes Mother    Hyperlipidemia Mother    Hypertension Mother    COPD Father    Lung cancer Brother  lung   Diabetes Maternal Grandfather    Cancer Daughter    Colon cancer Neg Hx     No Known Allergies  Current Outpatient Medications on File Prior to Visit  Medication Sig Dispense Refill   fluticasone (FLONASE) 50 MCG/ACT nasal spray Place 1 spray into both nostrils daily. 16 g 0   ibuprofen (ADVIL,MOTRIN) 200 MG tablet Take 400 mg by mouth every 6 (six) hours as needed for moderate pain.     lisinopril-hydrochlorothiazide (ZESTORETIC) 10-12.5 MG tablet Take 0.5 tablets by mouth daily. 45 tablet 3   Multiple Vitamins-Minerals (WOMENS MULTI VITAMIN & MINERAL PO) Take 1 tablet by mouth 2 (two) times daily.     promethazine (PHENERGAN) 25 MG tablet Take 1 tablet (25 mg total) by  mouth every 6 (six) hours as needed for nausea or vomiting. 12 tablet 0   simvastatin (ZOCOR) 5 MG tablet Take 1 tablet (5 mg total) by mouth daily. 90 tablet 3   SitaGLIPtin-MetFORMIN HCl (JANUMET XR) [phone] MG TB24 Take 1 tablet by mouth daily. 90 tablet 1   TURMERIC PO Take 3 tablets by mouth 3 (three) times daily.     sucralfate (CARAFATE) 1 g tablet Take 1 tablet (1 g total) by mouth 4 (four) times daily -  with meals and at bedtime for 5 days. 20 tablet 0   No current facility-administered medications on file prior to visit.    BP 108/62   Pulse 87   Temp 98.5 F (36.9 C) (Oral)   Ht '5\' 6"'$  (1.676 m)   Wt 187 lb (84.8 kg)   SpO2 98%   BMI 30.18 kg/m       Objective:   Physical Exam Vitals and nursing note reviewed.  Constitutional:      Appearance: Normal appearance.  Cardiovascular:     Rate and Rhythm: Normal rate and regular rhythm.     Pulses: Normal pulses.     Heart sounds: Normal heart sounds.  Pulmonary:     Effort: Pulmonary effort is normal.     Breath sounds: Normal breath sounds.  Musculoskeletal:        General: Normal range of motion.  Skin:    General: Skin is warm and dry.     Capillary Refill: Capillary refill takes less than 2 seconds.  Neurological:     General: No focal deficit present.     Mental Status: She is alert and oriented to person, place, and time.  Psychiatric:        Mood and Affect: Mood normal.        Behavior: Behavior normal.        Thought Content: Thought content normal.        Judgment: Judgment normal.       Assessment & Plan:   1. Diabetes mellitus type 2 in obese ([facility])  - Hemoglobin A1c; Future  2. Lyme disease - will recheck CMP. Thankfully symptoms have resolved.  - Comprehensive metabolic panel; Future  Dorothyann Peng, NP

## 2020-12-01 NOTE — Addendum Note (Signed)
Addended by: Elmer Picker on: 12/01/2020 07:59 AM   Modules accepted: Orders

## 2020-12-06 ENCOUNTER — Encounter: Payer: Self-pay | Admitting: Adult Health

## 2020-12-08 ENCOUNTER — Ambulatory Visit (INDEPENDENT_AMBULATORY_CARE_PROVIDER_SITE_OTHER): Payer: PPO

## 2020-12-08 DIAGNOSIS — Z0000 Encounter for general adult medical examination without abnormal findings: Secondary | ICD-10-CM

## 2020-12-08 NOTE — Progress Notes (Signed)
Subjective:   Amanda Eaton is a 65 y.o. female who presents for an Initial Medicare Annual Wellness Visit.  I connected with Amanda Eaton today by telephone and verified that I am speaking with the correct person using two identifiers. Location patient: home Location provider: work Persons participating in the virtual visit: patient, provider.   I discussed the limitations, risks, security and privacy concerns of performing an evaluation and management service by telephone and the availability of in person appointments. I also discussed with the patient that there may be a patient responsible charge related to this service. The patient expressed understanding and verbally consented to this telephonic visit.    Interactive audio and video telecommunications were attempted between this provider and patient, however failed, due to patient having technical difficulties OR patient did not have access to video capability.  We continued and completed visit with audio only.    Review of Systems    N/a       Objective:    There were no vitals filed for this visit. There is no height or weight on file to calculate BMI.  Advanced Directives 10/16/2020 03/15/2016 06/14/2015 11/30/2014 07/17/2012 07/15/2012  Does Patient Have a Medical Advance Directive? No No No No Patient does not have advance directive Patient does not have advance directive  Pre-existing out of facility DNR order (yellow form or pink MOST form) - - - - No -    Current Medications (verified) Outpatient Encounter Medications as of 12/08/2020  Medication Sig   fluticasone (FLONASE) 50 MCG/ACT nasal spray Place 1 spray into both nostrils daily.   ibuprofen (ADVIL,MOTRIN) 200 MG tablet Take 400 mg by mouth every 6 (six) hours as needed for moderate pain.   lisinopril-hydrochlorothiazide (ZESTORETIC) 10-12.5 MG tablet Take 0.5 tablets by mouth daily.   Multiple Vitamins-Minerals (WOMENS MULTI VITAMIN & MINERAL PO) Take 1 tablet by  mouth 2 (two) times daily.   promethazine (PHENERGAN) 25 MG tablet Take 1 tablet (25 mg total) by mouth every 6 (six) hours as needed for nausea or vomiting.   simvastatin (ZOCOR) 5 MG tablet Take 1 tablet (5 mg total) by mouth daily.   SitaGLIPtin-MetFORMIN HCl (JANUMET XR) [phone] MG TB24 Take 1 tablet by mouth daily.   sucralfate (CARAFATE) 1 g tablet Take 1 tablet (1 g total) by mouth 4 (four) times daily -  with meals and at bedtime for 5 days.   TURMERIC PO Take 3 tablets by mouth 3 (three) times daily.   No facility-administered encounter medications on file as of 12/08/2020.    Allergies (verified) Patient has no known allergies.   History: Past Medical History:  Diagnosis Date   DIABETES MELLITUS, TYPE II 11/23/2006   HYPERLIPIDEMIA, BORDERLINE 04/08/2008   HYPERTENSION 11/23/2006   WEIGHT GAIN 10/12/2008   Past Surgical History:  Procedure Laterality Date   ABDOMINAL HYSTERECTOMY     ANTERIOR AND POSTERIOR REPAIR N/A 07/17/2012   Procedure: ANTERIOR (CYSTOCELE) AND POSTERIOR REPAIR (RECTOCELE);  Surgeon: Princess Bruins, MD;  Location: [facility];  Service: Gynecology;  Laterality: N/A;   CATARACT EXTRACTION  04/24/2013   HYSTEROSCOPY W/ ENDOMETRIAL ABLATION     1987   MYOMECTOMY VAGINAL APPROACH     ROBOTIC ASSISTED TOTAL HYSTERECTOMY N/A 07/17/2012   Procedure: ROBOTIC ASSISTED TOTAL HYSTERECTOMY with Utero-sacral Ligament Suspension;  Surgeon: Princess Bruins, MD;  Location: [facility];  Service: Gynecology;  Laterality: N/A;   Family History  Problem Relation Age of Onset   Diabetes Mother    Hyperlipidemia  Mother    Hypertension Mother    COPD Father    Lung cancer Brother        lung   Diabetes Maternal Grandfather    Cancer Daughter    Colon cancer Neg Hx    Social History   Socioeconomic History   Marital status: Married    Spouse name: Not on file   Number of children: Not on file   Years of education: Not on file   Highest education level: Not on file   Occupational History   Not on file  Tobacco Use   Smoking status: Never   Smokeless tobacco: Never  Vaping Use   Vaping Use: Never used  Substance and Sexual Activity   Alcohol use: No    Alcohol/week: 0.0 standard drinks   Drug use: No   Sexual activity: Yes    Partners: Male    Comment: 1st intercourse- 17, married- 62 yrs   Other Topics Concern   Not on file  Social History Narrative   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 All live locally    Social Determinants of Health   Financial Resource Strain: Not on file  Food Insecurity: Not on file  Transportation Needs: Not on file  Physical Activity: Not on file  Stress: Not on file  Social Connections: Not on file    Tobacco Counseling Counseling given: Not Answered   Clinical Intake:                 Diabetic?yes Nutrition Risk Assessment:  Has the patient had any N/V/D within the last 2 months?  No  Does the patient have any non-healing wounds?  No  Has the patient had any unintentional weight loss or weight gain?  No   Diabetes:  Is the patient diabetic?  Yes  If diabetic, was a CBG obtained today?  No  Did the patient bring in their glucometer from home?  No  How often do you monitor your CBG's? Daily .   Financial Strains and Diabetes Management:  Are you having any financial strains with the device, your supplies or your medication? No .  Does the patient want to be seen by Chronic Care Management for management of their diabetes?  No  Would the patient like to be referred to a Nutritionist or for Diabetic Management?  No   Diabetic Exams:  Diabetic Eye Exam: Completed 08/2020 Diabetic Foot Exam: Overdue, Pt has been advised about the importance in completing this exam. Pt is scheduled for diabetic foot exam on next office visit .          Activities of Daily Living No flowsheet data found.  Patient Care  Team: Dorothyann Peng, NP as PCP - General (Family Medicine) Princess Bruins, MD as Consulting Physician (Obstetrics and Gynecology)  Indicate any recent Medical Services you may have received from other than [facility] providers in the past year (date may be approximate).     Assessment:   This is a routine wellness examination for Laketha.  Hearing/Vision screen No results found.  Dietary issues and exercise activities discussed:     Goals Addressed   None    Depression Screen PHQ 2/9 Scores 12/16/2019 04/09/2018 02/05/2018 03/15/2016  PHQ - 2 Score 0 0 0 0  PHQ- 9 Score 0 - 0 -  Exception Documentation - - - Other- indicate reason in comment box  Fall Risk Fall Risk  12/16/2019 04/09/2018 03/15/2016  Falls in the past year? 0 0 No  Number falls in past yr: 0 - -  Injury with Fall? 0 - -  Risk for fall due to : - - Other (Comment)    [place]:  Any stairs in or around the home? Yes  If so, are there any without handrails? No  Home free of loose throw rugs in walkways, pet beds, electrical cords, etc? Yes  Adequate lighting in your home to reduce risk of falls? Yes   ASSISTIVE DEVICES UTILIZED TO PREVENT FALLS:  Life alert? No  Use of a cane, walker or w/c? No  Grab bars in the bathroom? No  Shower chair or bench in shower? No  Elevated toilet seat or a handicapped toilet? No    Cognitive Function:    Normal cognitive status assessed by direct observation by this Nurse Health Advisor. No abnormalities found.      Immunizations Immunization History  Administered Date(s) Administered   Fluad Quad(high Dose 65+) 01/23/2020   Influenza Split 05/01/2012   Influenza Whole 04/03/2007, 01/14/2009, 02/28/2010   Influenza, Quadrivalent, Recombinant, Inj, Pf 01/14/2019   Influenza, Seasonal, Injecte, Preservative Fre 12/14/2014   Influenza,inj,Quad PF,6+ Mos 03/03/2013, 03/02/2014, 02/15/2016, 03/06/2017, 02/05/2018   Moderna  Sars-Covid-2 Vaccination 07/24/2019, 08/16/2019, 02/20/2020   Pneumococcal Conjugate-13 12/14/2014   Pneumococcal Polysaccharide-23 07/18/2012, 06/22/2020   Td 01/03/2007   Tdap 03/06/2017    TDAP status: Up to date  Flu Vaccine status: Up to date  Pneumococcal vaccine status: Up to date  Covid-19 vaccine status: Completed vaccines  Qualifies for Shingles Vaccine? Yes   Zostavax completed No   Shingrix Completed?: No.    Education has been provided regarding the importance of this vaccine. Patient has been advised to call insurance company to determine out of pocket expense if they have not yet received this vaccine. Advised may also receive vaccine at local pharmacy or Health Dept. Verbalized acceptance and understanding.  Screening Tests Health Maintenance  Topic Date Due   Zoster Vaccines- Shingrix (1 of 2) Never done   OPHTHALMOLOGY EXAM  09/03/2019   COVID-19 Vaccine (4 - Booster) 06/21/2020   INFLUENZA VACCINE  11/22/2020   FOOT EXAM  12/15/2020   HEMOGLOBIN A1C  06/03/2021   MAMMOGRAM  11/20/2022   COLONOSCOPY (Pts 45-37yr Insurance coverage will need to be confirmed)  06/27/2025   TETANUS/TDAP  03/07/2027   DEXA SCAN  Completed   Hepatitis C Screening  Completed   PNA vac Low Risk Adult  Completed   HPV VACCINES  Aged Out    Health Maintenance  Health Maintenance Due  Topic Date Due   Zoster Vaccines- Shingrix (1 of 2) Never done   OPHTHALMOLOGY EXAM  09/03/2019   COVID-19 Vaccine (4 - Booster) 06/21/2020   INFLUENZA VACCINE  11/22/2020    Colorectal cancer screening: Type of screening: Colonoscopy. Completed 06/28/2015. Repeat every 10 years  Mammogram status: Completed 11/19/2020. Repeat every year  Bone Density status: Completed 10/14/2019. Results reflect: Bone density results: OSTEOPENIA. Repeat every 5 years.  Lung Cancer Screening: (Low Dose CT Chest recommended if Age 66-80years, 30 pack-year currently smoking OR have quit w/in 15years.) does not  qualify.   Lung Cancer Screening Referral: n/a  Additional Screening:  Hepatitis C Screening: does not qualify; Completed 04/09/2018  Vision Screening: Recommended annual ophthalmology exams for early detection of glaucoma and other disorders of the eye. Is the patient up  to date with their annual eye exam?  Yes  Who is the provider or what is the name of the office in which the patient attends annual eye exams? Dr.Woods If pt is not established with a provider, would they like to be referred to a provider to establish care? No .   Dental Screening: Recommended annual dental exams for proper oral hygiene  Community Resource Referral / Chronic Care Management: CRR required this visit?  No   CCM required this visit?  No      Plan:     I have personally reviewed and noted the following in the patient's chart:   Medical and social history Use of alcohol, tobacco or illicit drugs  Current medications and supplements including opioid prescriptions. Patient is not currently taking opioid prescriptions. Functional ability and status Nutritional status Physical activity Advanced directives List of other physicians Hospitalizations, surgeries, and ER visits in previous 12 months Vitals Screenings to include cognitive, depression, and falls Referrals and appointments  In addition, I have reviewed and discussed with patient certain preventive protocols, quality metrics, and best practice recommendations. A written personalized care plan for preventive services as well as general preventive health recommendations were provided to patient.     Randel Pigg, LPN   [id number]   Nurse Notes: none

## 2020-12-08 NOTE — Patient Instructions (Signed)
Amanda Eaton , Thank you for taking time to come for your Medicare Wellness Visit. I appreciate your ongoing commitment to your health goals. Please review the following plan we discussed and let me know if I can assist you in the future.   Screening recommendations/referrals: Colonoscopy: 09/28/2015  due 2027 Mammogram: 11/19/2020 Bone Density: 10/14/2019 Recommended yearly ophthalmology/optometry visit for glaucoma screening and checkup Recommended yearly dental visit for hygiene and checkup  Vaccinations: Influenza vaccine: due in fall 2022  Pneumococcal vaccine: completed series  Tdap vaccine: 03/06/2017 Shingles vaccine: will obtain local pharmacy     Advanced directives: none   Conditions/risks identified: none   Next appointment: 03/03/2021 [place] 65 Years and Older, Female Preventive care refers to lifestyle choices and visits with your health care provider that can promote health and wellness. What does preventive care include? A yearly physical exam. This is also called an annual well check. Dental exams once or twice a year. Routine eye exams. Ask your health care provider how often you should have your eyes checked. Personal lifestyle choices, including: Daily care of your teeth and gums. Regular physical activity. Eating a healthy diet. Avoiding tobacco and drug use. Limiting alcohol use. Practicing safe sex. Taking low-dose aspirin every day. Taking vitamin and mineral supplements as recommended by your health care provider. What happens during an annual well check? The services and screenings done by your health care provider during your annual well check will depend on your age, overall health, lifestyle risk factors, and family history of disease. Counseling  Your health care provider may ask you questions about your: Alcohol use. Tobacco use. Drug use. Emotional well-being. Home and relationship well-being. Sexual  activity. Eating habits. History of falls. Memory and ability to understand (cognition). Work and work Statistician. Reproductive health. Screening  You may have the following tests or measurements: Height, weight, and BMI. Blood pressure. Lipid and cholesterol levels. These may be checked every 5 years, or more frequently if you are over 47 years old. Skin check. Lung cancer screening. You may have this screening every year starting at age 23 if you have a 30-pack-year history of smoking and currently smoke or have quit within the past 15 years. Fecal occult blood test (FOBT) of the stool. You may have this test every year starting at age 16. Flexible sigmoidoscopy or colonoscopy. You may have a sigmoidoscopy every 5 years or a colonoscopy every 10 years starting at age 36. Hepatitis C blood test. Hepatitis B blood test. Sexually transmitted disease (STD) testing. Diabetes screening. This is done by checking your blood sugar (glucose) after you have not eaten for a while (fasting). You may have this done every 1-3 years. Bone density scan. This is done to screen for osteoporosis. You may have this done starting at age 48. Mammogram. This may be done every 1-2 years. Talk to your health care provider about how often you should have regular mammograms. Talk with your health care provider about your test results, treatment options, and if necessary, the need for more tests. Vaccines  Your health care provider may recommend certain vaccines, such as: Influenza vaccine. This is recommended every year. Tetanus, diphtheria, and acellular pertussis (Tdap, Td) vaccine. You may need a Td booster every 10 years. Zoster vaccine. You may need this after age 29. Pneumococcal 13-valent conjugate (PCV13) vaccine. One dose is recommended after age 32. Pneumococcal polysaccharide (PPSV23) vaccine. One dose is recommended after age 95. Talk to your health  care provider about which screenings and vaccines  you need and how often you need them. This information is not intended to replace advice given to you by your health care provider. Make sure you discuss any questions you have with your health care provider. Document Released: 05/07/2015 Document Revised: 12/29/2015 Document Reviewed: 02/09/2015 Elsevier Interactive Patient Education  2017 [place] Prevention in the Home Falls can cause injuries. They can happen to people of all ages. There are many things you can do to make your home safe and to help prevent falls. What can I do on the outside of my home? Regularly fix the edges of walkways and driveways and fix any cracks. Remove anything that might make you trip as you walk through a door, such as a raised step or threshold. Trim any bushes or trees on the path to your home. Use bright outdoor lighting. Clear any walking paths of anything that might make someone trip, such as rocks or tools. Regularly check to see if handrails are loose or broken. Make sure that both sides of any steps have handrails. Any raised decks and porches should have guardrails on the edges. Have any leaves, snow, or ice cleared regularly. Use sand or salt on walking paths during winter. Clean up any spills in your garage right away. This includes oil or grease spills. What can I do in the bathroom? Use night lights. Install grab bars by the toilet and in the tub and shower. Do not use towel bars as grab bars. Use non-skid mats or decals in the tub or shower. If you need to sit down in the shower, use a plastic, non-slip stool. Keep the floor dry. Clean up any water that spills on the floor as soon as it happens. Remove soap buildup in the tub or shower regularly. Attach bath mats securely with double-sided non-slip rug tape. Do not have throw rugs and other things on the floor that can make you trip. What can I do in the bedroom? Use night lights. Make sure that you have a light by your bed that  is easy to reach. Do not use any sheets or blankets that are too big for your bed. They should not hang down onto the floor. Have a firm chair that has side arms. You can use this for support while you get dressed. Do not have throw rugs and other things on the floor that can make you trip. What can I do in the kitchen? Clean up any spills right away. Avoid walking on wet floors. Keep items that you use a lot in easy-to-reach places. If you need to reach something above you, use a strong step stool that has a grab bar. Keep electrical cords out of the way. Do not use floor polish or wax that makes floors slippery. If you must use wax, use non-skid floor wax. Do not have throw rugs and other things on the floor that can make you trip. What can I do with my stairs? Do not leave any items on the stairs. Make sure that there are handrails on both sides of the stairs and use them. Fix handrails that are broken or loose. Make sure that handrails are as long as the stairways. Check any carpeting to make sure that it is firmly attached to the stairs. Fix any carpet that is loose or worn. Avoid having throw rugs at the top or bottom of the stairs. If you do have throw rugs, attach them to  the floor with carpet tape. Make sure that you have a light switch at the top of the stairs and the bottom of the stairs. If you do not have them, ask someone to add them for you. What else can I do to help prevent falls? Wear shoes that: Do not have high heels. Have rubber bottoms. Are comfortable and fit you well. Are closed at the toe. Do not wear sandals. If you use a stepladder: Make sure that it is fully opened. Do not climb a closed stepladder. Make sure that both sides of the stepladder are locked into place. Ask someone to hold it for you, if possible. Clearly mark and make sure that you can see: Any grab bars or handrails. First and last steps. Where the edge of each step is. Use tools that help you  move around (mobility aids) if they are needed. These include: Canes. Walkers. Scooters. Crutches. Turn on the lights when you go into a dark area. Replace any light bulbs as soon as they burn out. Set up your furniture so you have a clear path. Avoid moving your furniture around. If any of your floors are uneven, fix them. If there are any pets around you, be aware of where they are. Review your medicines with your doctor. Some medicines can make you feel dizzy. This can increase your chance of falling. Ask your doctor what other things that you can do to help prevent falls. This information is not intended to replace advice given to you by your health care provider. Make sure you discuss any questions you have with your health care provider. Document Released: 02/04/2009 Document Revised: 09/16/2015 Document Reviewed: 05/15/2014 Elsevier Interactive Patient Education  2017 [employer].

## 2020-12-19 ENCOUNTER — Other Ambulatory Visit: Payer: Self-pay | Admitting: Adult Health

## 2020-12-19 DIAGNOSIS — E782 Mixed hyperlipidemia: Secondary | ICD-10-CM

## 2020-12-19 DIAGNOSIS — I10 Essential (primary) hypertension: Secondary | ICD-10-CM

## 2021-01-09 ENCOUNTER — Other Ambulatory Visit: Payer: Self-pay | Admitting: Adult Health

## 2021-01-09 DIAGNOSIS — E118 Type 2 diabetes mellitus with unspecified complications: Secondary | ICD-10-CM

## 2021-03-02 ENCOUNTER — Other Ambulatory Visit: Payer: Self-pay

## 2021-03-03 ENCOUNTER — Ambulatory Visit (INDEPENDENT_AMBULATORY_CARE_PROVIDER_SITE_OTHER): Payer: PPO | Admitting: Adult Health

## 2021-03-03 VITALS — BP 120/88 | HR 71 | Temp 97.4°F | Ht 65.5 in | Wt 193.0 lb

## 2021-03-03 DIAGNOSIS — Z0000 Encounter for general adult medical examination without abnormal findings: Secondary | ICD-10-CM

## 2021-03-03 DIAGNOSIS — Z23 Encounter for immunization: Secondary | ICD-10-CM | POA: Diagnosis not present

## 2021-03-03 DIAGNOSIS — E782 Mixed hyperlipidemia: Secondary | ICD-10-CM

## 2021-03-03 DIAGNOSIS — I10 Essential (primary) hypertension: Secondary | ICD-10-CM

## 2021-03-03 DIAGNOSIS — E118 Type 2 diabetes mellitus with unspecified complications: Secondary | ICD-10-CM

## 2021-03-03 LAB — LIPID PANEL
Cholesterol: 178 mg/dL (ref 0–200)
HDL: 64.5 mg/dL (ref >39.00)
LDL Cholesterol: 101 mg/dL — ABNORMAL HIGH (ref 0–99)
NonHDL: 113.97
Total CHOL/HDL Ratio: 3
Triglycerides: 64 mg/dL (ref 0.0–149.0)
VLDL: 12.8 mg/dL (ref 0.0–40.0)

## 2021-03-03 LAB — CBC WITH DIFFERENTIAL/PLATELET
Basophils Absolute: 0 10*3/uL (ref 0.0–0.1)
Basophils Relative: 0.6 % (ref 0.0–3.0)
Eosinophils Absolute: 0.1 10*3/uL (ref 0.0–0.7)
Eosinophils Relative: 2.7 % (ref 0.0–5.0)
HCT: 38.4 % (ref 36.0–46.0)
Hemoglobin: 12.5 g/dL (ref 12.0–15.0)
Lymphocytes Relative: 31.2 % (ref 12.0–46.0)
Lymphs Abs: 1.3 10*3/uL (ref 0.7–4.0)
MCHC: 32.5 g/dL (ref 30.0–36.0)
MCV: 88.5 fl (ref 78.0–100.0)
Monocytes Absolute: 0.3 10*3/uL (ref 0.1–1.0)
Monocytes Relative: 8.6 % (ref 3.0–12.0)
Neutro Abs: 2.3 10*3/uL (ref 1.4–7.7)
Neutrophils Relative %: 56.9 % (ref 43.0–77.0)
Platelets: 207 10*3/uL (ref 150.0–400.0)
RBC: 4.34 Mil/uL (ref 3.87–5.11)
RDW: 13.2 % (ref 11.5–15.5)
WBC: 4 10*3/uL (ref 4.0–10.5)

## 2021-03-03 LAB — COMPREHENSIVE METABOLIC PANEL
ALT: 13 U/L (ref 0–35)
AST: 17 U/L (ref 0–37)
Albumin: 4.2 g/dL (ref 3.5–5.2)
Alkaline Phosphatase: 55 U/L (ref 39–117)
BUN: 33 mg/dL — ABNORMAL HIGH (ref 6–23)
CO2: 31 mEq/L (ref 19–32)
Calcium: 9.5 mg/dL (ref 8.4–10.5)
Chloride: 106 mEq/L (ref 96–112)
Creatinine, Ser: 0.75 mg/dL (ref 0.40–1.20)
GFR: 82.87 mL/min (ref >60.00)
Glucose, Bld: 101 mg/dL — ABNORMAL HIGH (ref 70–99)
Potassium: 4.2 mEq/L (ref 3.5–5.1)
Sodium: 142 mEq/L (ref 135–145)
Total Bilirubin: 0.5 mg/dL (ref 0.2–1.2)
Total Protein: 6.7 g/dL (ref 6.0–8.3)

## 2021-03-03 LAB — HEMOGLOBIN A1C: Hgb A1c MFr Bld: 6.1 % (ref 4.6–6.5)

## 2021-03-03 LAB — TSH: TSH: 1.63 u[IU]/mL (ref 0.35–5.50)

## 2021-03-03 NOTE — Patient Instructions (Signed)
It was great seeing you today   Please work on reducing carbs   I will see you back in 6 months for diabetes check   We will follow up with you regarding your blood work

## 2021-03-03 NOTE — Progress Notes (Signed)
Subjective:    Patient ID: Amanda Eaton, female    DOB: [birth year], 65 y.o.   MRN: [id number]  HPI Patient presents for yearly preventative medicine examination. She is a pleasant 65 year old female who  has a past medical history of DIABETES MELLITUS, TYPE II (11/23/2006), HYPERLIPIDEMIA, BORDERLINE (04/08/2008), HYPERTENSION (11/23/2006), and WEIGHT GAIN (10/12/2008).  Diabetes mellitus type 2-controlled with Janumet extended release [phone] mg.  She does monitor her blood sugars at home periodically and reports readings between 90 and 110.  She denies any hypoglycemic events. She has been eating a lot of carbs lately.  Lab Results  Component Value Date   HGBA1C 6.4 12/01/2020   Hypertension-prescribed Zestoretic 10-12.5 mg daily, takes one half a tab.  She denies dizziness, lightheadedness, chest pain, shortness of breath, or syncopal episodes BP Readings from Last 3 Encounters:  03/03/21 120/88  12/01/20 108/62  10/16/20 (!) 108/55   Hyperlipidemia-currently prescribed simvastatin 5 mg daily.  She denies myalgia or fatigue Lab Results  Component Value Date   CHOL 185 06/22/2020   HDL 70.90 06/22/2020   LDLCALC 101 (H) 06/22/2020   LDLDIRECT 158.8 10/04/2010   TRIG 64.0 06/22/2020   CHOLHDL 3 06/22/2020    All immunizations and health maintenance protocols were reviewed with the patient and needed orders were placed.  Appropriate screening laboratory values were ordered for the patient including screening of hyperlipidemia, renal function and hepatic function.  Medication reconciliation,  past medical history, social history, problem list and allergies were reviewed in detail with the patient  Goals were established with regard to weight loss, exercise, and  diet in compliance with medications Wt Readings from Last 3 Encounters:  03/03/21 193 lb (87.5 kg)  12/01/20 187 lb (84.8 kg)  10/27/20 183 lb (83 kg)    She is up-to-date on routine colon cancer screening, mammograms,  and GYN care.  She is doing well and has no acute complaints   Review of Systems  Constitutional: Negative.   HENT: Negative.    Eyes: Negative.   Respiratory: Negative.    Cardiovascular: Negative.   Gastrointestinal: Negative.   Endocrine: Negative.   Genitourinary: Negative.   Musculoskeletal: Negative.   Skin: Negative.   Allergic/Immunologic: Negative.   Neurological: Negative.   Hematological: Negative.   Psychiatric/Behavioral: Negative.     Past Medical History:  Diagnosis Date   DIABETES MELLITUS, TYPE II 11/23/2006   HYPERLIPIDEMIA, BORDERLINE 04/08/2008   HYPERTENSION 11/23/2006   WEIGHT GAIN 10/12/2008    Social History   Socioeconomic History   Marital status: Married    Spouse name: Not on file   Number of children: Not on file   Years of education: Not on file   Highest education level: Not on file  Occupational History   Not on file  Tobacco Use   Smoking status: Never   Smokeless tobacco: Never  Vaping Use   Vaping Use: Never used  Substance and Sexual Activity   Alcohol use: No    Alcohol/week: 0.0 standard drinks   Drug use: No   Sexual activity: Yes    Partners: Male    Comment: 1st intercourse- 71, married- 40 yrs   Other Topics Concern   Not on file  Social History Narrative   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 All live locally    Social Determinants of Health   Financial Resource Strain: Low  Risk    Difficulty of Paying Living Expenses: Not hard at all  Food Insecurity: No Food Insecurity   Worried About [place] in the Last Year: Never true   Ran Out of Food in the Last Year: Never true  Transportation Needs: No Transportation Needs   Lack of Transportation (Medical): No   Lack of Transportation (Non-Medical): No  Physical Activity: Insufficiently Active   Days of Exercise per Week: 3 days   Minutes of Exercise per Session: 30 min  Stress: No Stress  Concern Present   Feeling of Stress : Not at all  Social Connections: Moderately Isolated   Frequency of Communication with Friends and Family: Three times a week   Frequency of Social Gatherings with Friends and Family: Three times a week   Attends Religious Services: Never   Active Member of Clubs or Organizations: No   Attends Archivist Meetings: Never   Marital Status: Married  Human resources officer Violence: Not At Risk   Fear of Current or Ex-Partner: No   Emotionally Abused: No   Physically Abused: No   Sexually Abused: No    Past Surgical History:  Procedure Laterality Date   ABDOMINAL HYSTERECTOMY     ANTERIOR AND POSTERIOR REPAIR N/A 07/17/2012   Procedure: ANTERIOR (CYSTOCELE) AND POSTERIOR REPAIR (RECTOCELE);  Surgeon: Princess Bruins, MD;  Location: [facility];  Service: Gynecology;  Laterality: N/A;   CATARACT EXTRACTION  04/24/2013   HYSTEROSCOPY W/ ENDOMETRIAL ABLATION     1987   MYOMECTOMY VAGINAL APPROACH     ROBOTIC ASSISTED TOTAL HYSTERECTOMY N/A 07/17/2012   Procedure: ROBOTIC ASSISTED TOTAL HYSTERECTOMY with Utero-sacral Ligament Suspension;  Surgeon: Princess Bruins, MD;  Location: [facility];  Service: Gynecology;  Laterality: N/A;    Family History  Problem Relation Age of Onset   Diabetes Mother    Hyperlipidemia Mother    Hypertension Mother    COPD Father    Lung cancer Brother        lung   Diabetes Maternal Grandfather    Cancer Daughter    Colon cancer Neg Hx     No Known Allergies  Current Outpatient Medications on File Prior to Visit  Medication Sig Dispense Refill   fluticasone (FLONASE) 50 MCG/ACT nasal spray Place 1 spray into both nostrils daily. 16 g 0   ibuprofen (ADVIL,MOTRIN) 200 MG tablet Take 400 mg by mouth every 6 (six) hours as needed for moderate pain.     JANUMET XR [phone] MG TB24 TAKE ONE TABLET BY MOUTH DAILY 90 tablet 1   lisinopril-hydrochlorothiazide (ZESTORETIC) 10-12.5 MG tablet TAKE 1/2 TABLET BY MOUTH DAILY  45 tablet 3   Multiple Vitamins-Minerals (WOMENS MULTI VITAMIN & MINERAL PO) Take 1 tablet by mouth 2 (two) times daily.     promethazine (PHENERGAN) 25 MG tablet Take 1 tablet (25 mg total) by mouth every 6 (six) hours as needed for nausea or vomiting. 12 tablet 0   simvastatin (ZOCOR) 5 MG tablet TAKE ONE TABLET BY MOUTH DAILY 90 tablet 3   TURMERIC PO Take 3 tablets by mouth 3 (three) times daily.     No current facility-administered medications on file prior to visit.    BP 120/88   Pulse 71   Temp (!) 97.4 F (36.3 C) (Oral)   Ht 5' 5.5" (1.664 m)   Wt 193 lb (87.5 kg)   SpO2 97%   BMI 31.63 kg/m        Objective:   Physical Exam  Vitals and nursing note reviewed.  Constitutional:      General: She is not in acute distress.    Appearance: Normal appearance. She is well-developed. She is not ill-appearing.  HENT:     Head: Normocephalic and atraumatic.     Right Ear: Tympanic membrane, ear canal and external ear normal. There is no impacted cerumen.     Left Ear: Tympanic membrane, ear canal and external ear normal. There is no impacted cerumen.     Nose: Nose normal. No congestion or rhinorrhea.     Mouth/Throat:     Mouth: Mucous membranes are moist.     Pharynx: Oropharynx is clear. No oropharyngeal exudate or posterior oropharyngeal erythema.  Eyes:     General:        Right eye: No discharge.        Left eye: No discharge.     Extraocular Movements: Extraocular movements intact.     Conjunctiva/sclera: Conjunctivae normal.     Pupils: Pupils are equal, round, and reactive to light.  Neck:     Thyroid: No thyromegaly.     Vascular: No carotid bruit.     Trachea: No tracheal deviation.  Cardiovascular:     Rate and Rhythm: Normal rate and regular rhythm.     Pulses: Normal pulses.     Heart sounds: Normal heart sounds. No murmur heard.   No friction rub. No gallop.  Pulmonary:     Effort: Pulmonary effort is normal. No respiratory distress.     Breath  sounds: Normal breath sounds. No stridor. No wheezing, rhonchi or rales.  Chest:     Chest wall: No tenderness.  Abdominal:     General: Abdomen is flat. Bowel sounds are normal. There is no distension.     Palpations: Abdomen is soft. There is no mass.     Tenderness: There is no abdominal tenderness. There is no right CVA tenderness, left CVA tenderness, guarding or rebound.     Hernia: No hernia is present.  Musculoskeletal:        General: No swelling, tenderness, deformity or signs of injury. Normal range of motion.     Cervical back: Normal range of motion and neck supple.     Right lower leg: No edema.     Left lower leg: No edema.  Lymphadenopathy:     Cervical: No cervical adenopathy.  Skin:    General: Skin is warm and dry.     Coloration: Skin is not jaundiced or pale.     Findings: No bruising, erythema, lesion or rash.  Neurological:     General: No focal deficit present.     Mental Status: She is alert and oriented to person, place, and time.     Cranial Nerves: No cranial nerve deficit.     Sensory: No sensory deficit.     Motor: No weakness.     Coordination: Coordination normal.     Gait: Gait normal.     Deep Tendon Reflexes: Reflexes normal.  Psychiatric:        Mood and Affect: Mood normal.        Behavior: Behavior normal.        Thought Content: Thought content normal.        Judgment: Judgment normal.      Assessment & Plan:  1. Routine general medical examination at a health care facility - Encouraged weight loss through diet and exercise - Follow up in one year or sooner if needed  -  CBC with Differential/Platelet; Future - Comprehensive metabolic panel; Future - Lipid panel; Future - TSH; Future - Hemoglobin A1c; Future  2. Controlled type 2 diabetes mellitus with complication, without long-term current use of insulin ([place]) - Likely well controlled.  - Advised cutting back carbs  - Follow up in 6 months  - CBC with Differential/Platelet;  Future - Comprehensive metabolic panel; Future - Lipid panel; Future - TSH; Future - Hemoglobin A1c; Future  3. Mixed hyperlipidemia -Consider increase in statin  - CBC with Differential/Platelet; Future - Comprehensive metabolic panel; Future - Lipid panel; Future - TSH; Future - Hemoglobin A1c; Future  4. Essential hypertension - well controlled. - No change in medications  - CBC with Differential/Platelet; Future - Comprehensive metabolic panel; Future - Lipid panel; Future - TSH; Future - Hemoglobin A1c; Future   5. Need for immunization against influenza  - Flu Vaccine QUAD 51mo+IM (Fluarix, Fluzone & Alfiuria Quad PF)  Dorothyann Peng, NP

## 2021-07-24 ENCOUNTER — Other Ambulatory Visit: Payer: Self-pay | Admitting: Adult Health

## 2021-07-24 DIAGNOSIS — E118 Type 2 diabetes mellitus with unspecified complications: Secondary | ICD-10-CM

## 2021-08-31 ENCOUNTER — Ambulatory Visit (INDEPENDENT_AMBULATORY_CARE_PROVIDER_SITE_OTHER): Payer: PPO | Admitting: Adult Health

## 2021-08-31 ENCOUNTER — Encounter: Payer: Self-pay | Admitting: Adult Health

## 2021-08-31 VITALS — BP 110/82 | HR 70 | Temp 98.6°F | Ht 65.5 in | Wt 196.0 lb

## 2021-08-31 DIAGNOSIS — E118 Type 2 diabetes mellitus with unspecified complications: Secondary | ICD-10-CM | POA: Diagnosis not present

## 2021-08-31 DIAGNOSIS — I10 Essential (primary) hypertension: Secondary | ICD-10-CM

## 2021-08-31 LAB — POCT GLYCOSYLATED HEMOGLOBIN (HGB A1C): Hemoglobin A1C: 5.9 % — AB (ref 4.0–5.6)

## 2021-08-31 NOTE — Patient Instructions (Addendum)
It was great seeing you today!  ? ?Your A1c was 5.9 - this is much better than Amanda Eaton! Keep it up! ? ?I will see you back in 6 months for your physical exam - after 03/03/2022  ? ? ?

## 2021-08-31 NOTE — Progress Notes (Signed)
? ?Subjective:  ? ? Patient ID: Amanda Eaton, female    DOB: Jan 05, 1956, 66 y.o.   MRN: [id number] ? ?HPI ?66 year old female who  has a past medical history of DIABETES MELLITUS, TYPE II (11/23/2006), HYPERLIPIDEMIA, BORDERLINE (04/08/2008), HYPERTENSION (11/23/2006), and WEIGHT GAIN (10/12/2008). ? ?She presents to the office today for 82-monthfollow-up regarding diabetes mellitus type 2 and hypertension ? ?Diabetes mellitus type 2-is controlled with Janumet ER [phone] mg.  She does monitor her blood sugars at home periodically reports readings between 90 and 110.  She denies any hypoglycemic events. She has started walking more often.  ?Lab Results  ?Component Value Date  ? HGBA1C 6.1 03/03/2021  ? ?Hypertension-prescribed Zestoretic 10-12.5 mg daily, she takes a half a tab.  She denies dizziness, lightheadedness, chest pain, shortness of breath, or syncopal episodes ?BP Readings from Last 3 Encounters:  ?08/31/21 110/82  ?03/03/21 120/88  ?12/01/20 108/62  ? ?Review of Systems ?See HPI  ? ?Past Medical History:  ?Diagnosis Date  ? DIABETES MELLITUS, TYPE II 11/23/2006  ? HCleary BPacolet12/16/2009  ? HYPERTENSION 11/23/2006  ? WEIGHT GAIN 10/12/2008  ? ? ?Social History  ? ?Socioeconomic History  ? Marital status: Married  ?  Spouse name: Not on file  ? Number of children: Not on file  ? Years of education: Not on file  ? Highest education level: Not on file  ?Occupational History  ? Not on file  ?Tobacco Use  ? Smoking status: Never  ? Smokeless tobacco: Never  ?Vaping Use  ? Vaping Use: Never used  ?Substance and Sexual Activity  ? Alcohol use: No  ?  Alcohol/week: 0.0 standard drinks  ? Drug use: No  ? Sexual activity: Yes  ?  Partners: Male  ?  Comment: 1st intercourse- 89 married- 89yrs   ?Other Topics Concern  ? Not on file  ?Social History Narrative  ? 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 Married for 39 years  ? 3 kids and 3 grandchildren. All live locally   ? ?Social Determinants of  Health  ? ?Financial Resource Strain: Low Risk   ? Difficulty of Paying Living Expenses: Not hard at all  ?Food Insecurity: No Food Insecurity  ? Worried About RCharity fundraiserin the Last Year: Never true  ? Ran Out of Food in the Last Year: Never true  ?Transportation Needs: No Transportation Needs  ? Lack of Transportation (Medical): No  ? Lack of Transportation (Non-Medical): No  ?Physical Activity: Insufficiently Active  ? Days of Exercise per Week: 3 days  ? Minutes of Exercise per Session: 30 min  ?Stress: No Stress Concern Present  ? Feeling of Stress : Not at all  ?Social Connections: Moderately Isolated  ? Frequency of Communication with Friends and Family: Three times a week  ? Frequency of Social Gatherings with Friends and Family: Three times a week  ? Attends Religious Services: Never  ? Active Member of Clubs or Organizations: No  ? Attends CArchivistMeetings: Never  ? Marital Status: Married  ?Intimate Partner Violence: Not At Risk  ? Fear of Current or Ex-Partner: No  ? Emotionally Abused: No  ? Physically Abused: No  ? Sexually Abused: No  ? ? ?Past Surgical History:  ?Procedure Laterality Date  ? ABDOMINAL HYSTERECTOMY    ? ANTERIOR AND POSTERIOR REPAIR N/A 07/17/2012  ? Procedure: ANTERIOR (CYSTOCELE) AND POSTERIOR REPAIR (RECTOCELE);  Surgeon: MPrincess Bruins  MD;  Location: [facility];  Service: Gynecology;  Laterality: N/A;  ? CATARACT EXTRACTION  04/24/2013  ? HYSTEROSCOPY W/ ENDOMETRIAL ABLATION    ? 1987  ? MYOMECTOMY VAGINAL APPROACH    ? ROBOTIC ASSISTED TOTAL HYSTERECTOMY N/A 07/17/2012  ? Procedure: ROBOTIC ASSISTED TOTAL HYSTERECTOMY with Utero-sacral Ligament Suspension;  Surgeon: Princess Bruins, MD;  Location: [facility];  Service: Gynecology;  Laterality: N/A;  ? ? ?Family History  ?Problem Relation Age of Onset  ? Diabetes Mother   ? Hyperlipidemia Mother   ? Hypertension Mother   ? COPD Father   ? Lung cancer Brother   ?     lung  ? Diabetes Maternal Grandfather   ?  Cancer Daughter   ? Colon cancer Neg Hx   ? ? ?No Known Allergies ? ?Current Outpatient Medications on File Prior to Visit  ?Medication Sig Dispense Refill  ? ibuprofen (ADVIL,MOTRIN) 200 MG tablet Take 400 mg by mouth every 6 (six) hours as needed for moderate pain.    ? JANUMET XR [phone] MG TB24 TAKE ONE TABLET BY MOUTH DAILY 90 tablet 1  ? lisinopril-hydrochlorothiazide (ZESTORETIC) 10-12.5 MG tablet TAKE 1/2 TABLET BY MOUTH DAILY 45 tablet 3  ? Multiple Vitamins-Minerals (WOMENS MULTI VITAMIN & MINERAL PO) Take 1 tablet by mouth 2 (two) times daily.    ? simvastatin (ZOCOR) 5 MG tablet TAKE ONE TABLET BY MOUTH DAILY 90 tablet 3  ? TURMERIC PO Take 3 tablets by mouth 3 (three) times daily.    ? ?No current facility-administered medications on file prior to visit.  ? ? ?BP 110/82   Pulse 70   Temp 98.6 ?F (37 ?C) (Oral)   Ht 5' 5.5" (1.664 m)   Wt 196 lb (88.9 kg)   SpO2 98%   BMI 32.12 kg/m?  ? ? ?   ?Objective:  ? Physical Exam ?Vitals and nursing note reviewed.  ?Constitutional:   ?   Appearance: Normal appearance.  ?Cardiovascular:  ?   Rate and Rhythm: Normal rate and regular rhythm.  ?   Pulses: Normal pulses.  ?   Heart sounds: Normal heart sounds.  ?Pulmonary:  ?   Effort: Pulmonary effort is normal.  ?   Breath sounds: Normal breath sounds.  ?Skin: ?   General: Skin is warm and dry.  ?Neurological:  ?   General: No focal deficit present.  ?   Mental Status: She is alert and oriented to person, place, and time.  ?Psychiatric:     ?   Mood and Affect: Mood normal.     ?   Behavior: Behavior normal.     ?   Thought Content: Thought content normal.     ?   Judgment: Judgment normal.  ? ?   ?Assessment & Plan:  ? ?1. Controlled type 2 diabetes mellitus with complication, without long-term current use of insulin ([place]) ? ?- POC HgB A1c- 5.9 - well controlled and at goal  ?- No change in medication  ?- Follow up in 6 months for CPE  ? ?2. Essential hypertension ?- Well controlled.  ?- No change in  medication  ? ?Dorothyann Peng, NP ? ? ?

## 2021-09-06 IMAGING — US US ABDOMEN LIMITED
1 series · 14 of 25 positions shown · non-contrast
Comparison: Current CT a chest.

CLINICAL DATA: Epigastric pain for 2-3 days. Fevers, malaise and
fatigue. Nausea.

EXAM:
ULTRASOUND ABDOMEN LIMITED RIGHT UPPER QUADRANT

[Series 1: us abdomen limited ruq (liver/gb) · 14 of 86 slices shown]
[im 1/86]
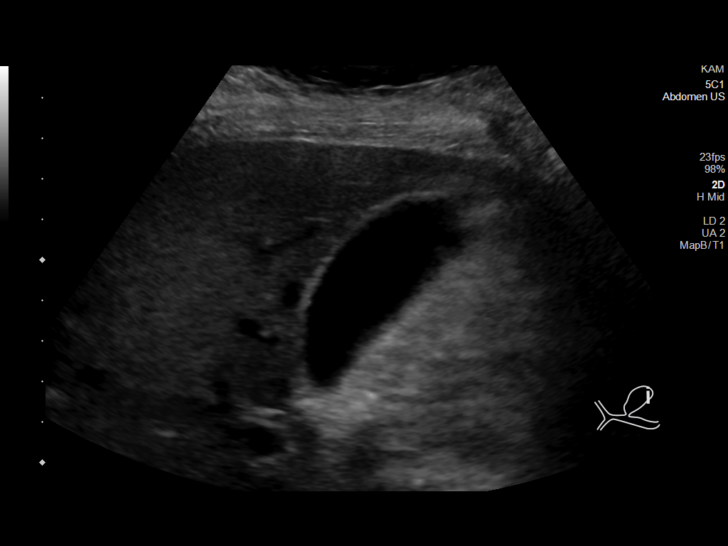
[im 8/86]
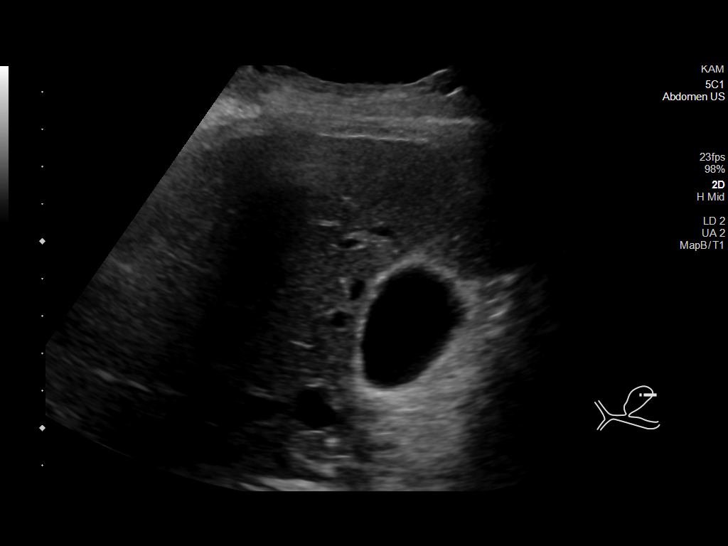
[im 15/86]
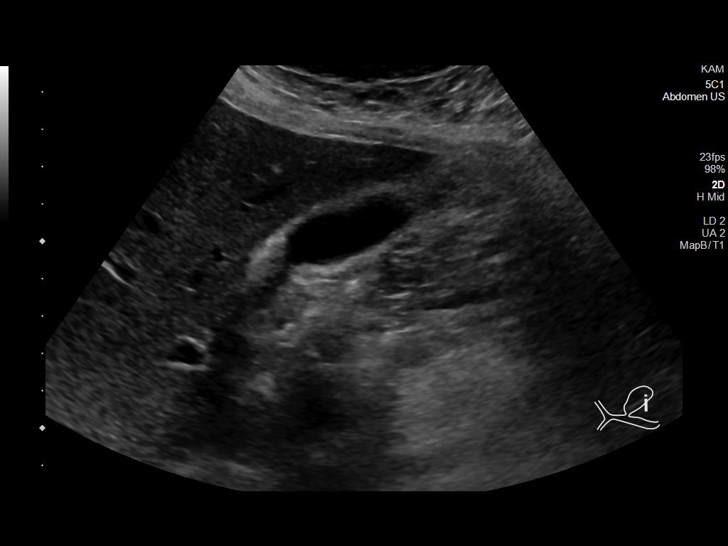
[im 22/86]
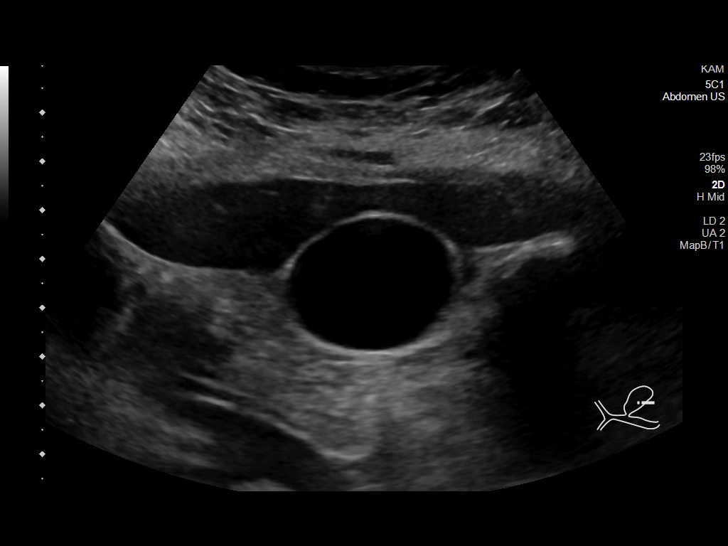
[im 29/86]
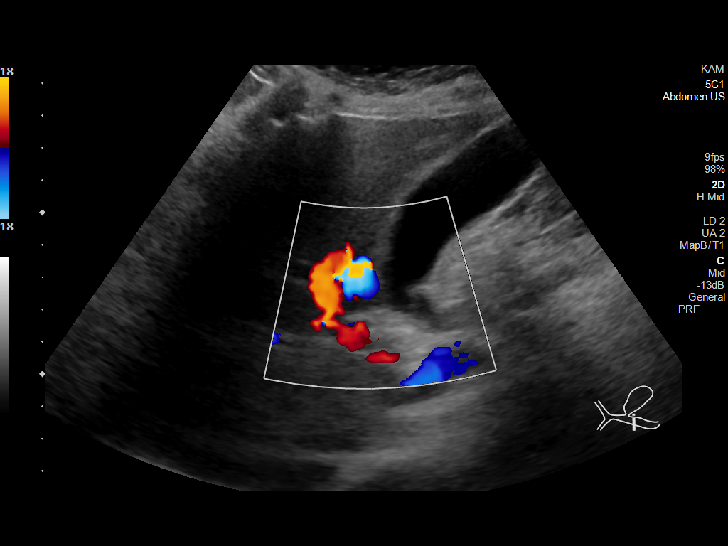
[im 32/86]
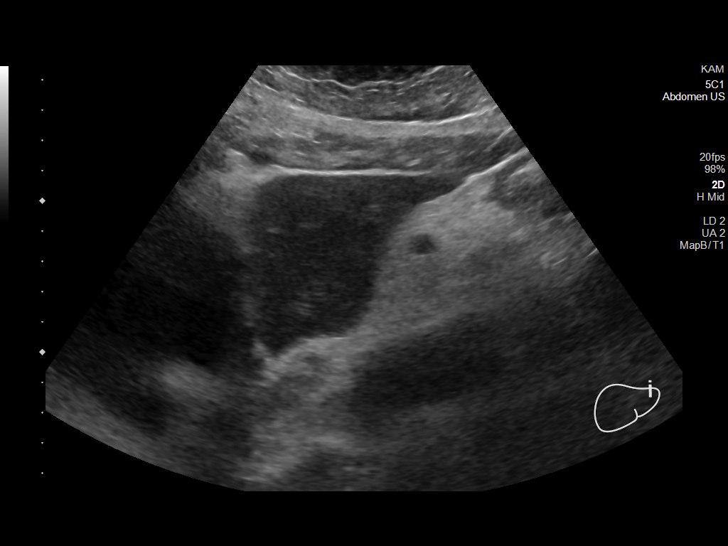
[im 39/86]
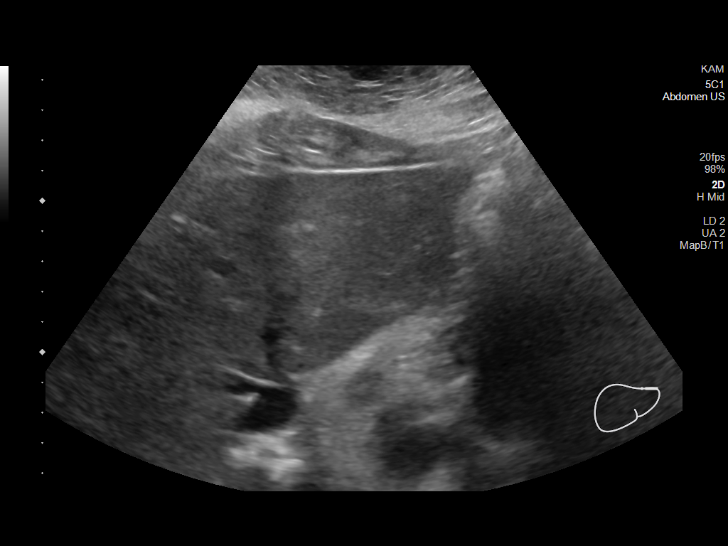
[im 47/86]
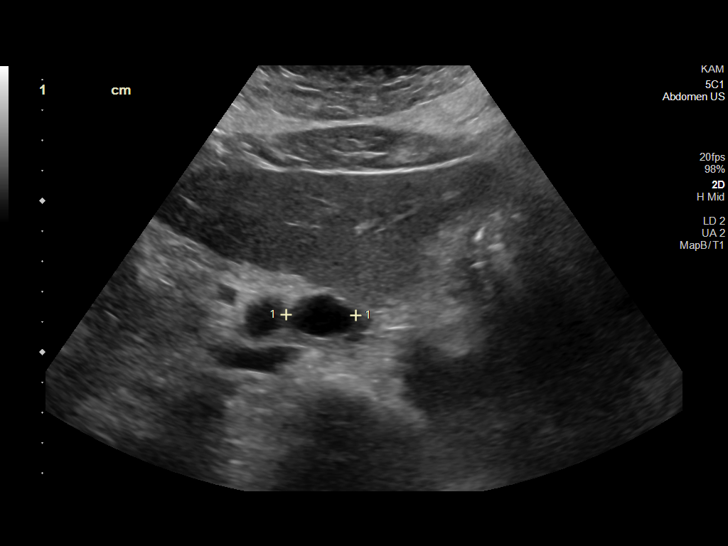
[im 54/86]
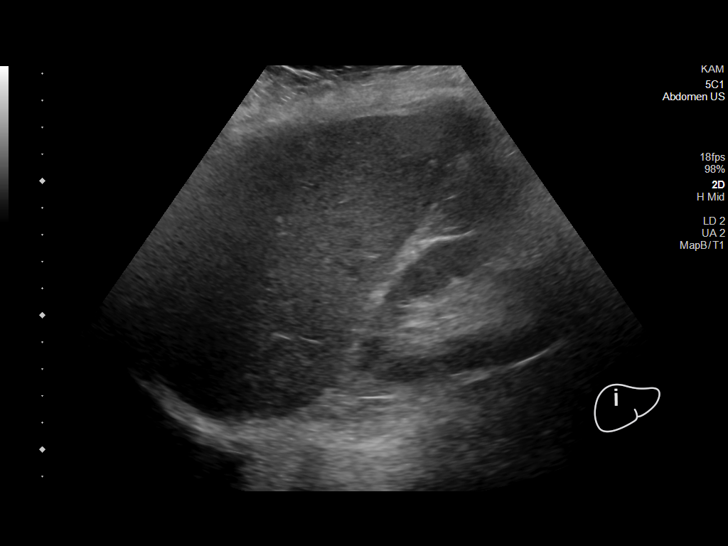
[im 57/86]
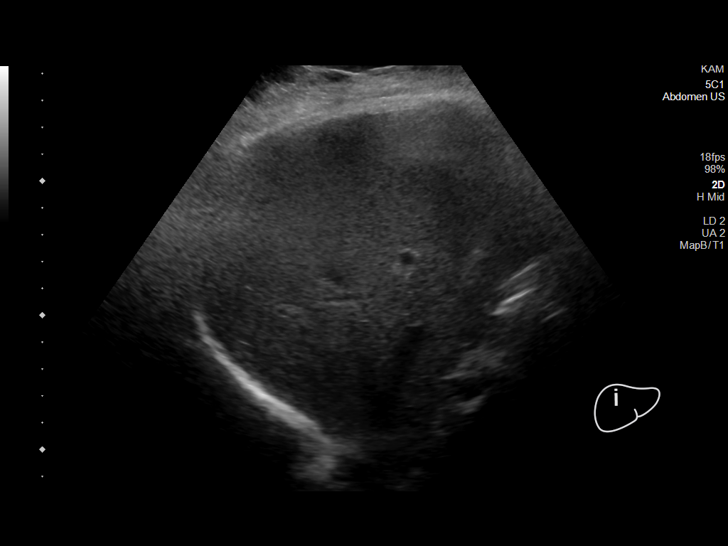
[im 64/86]
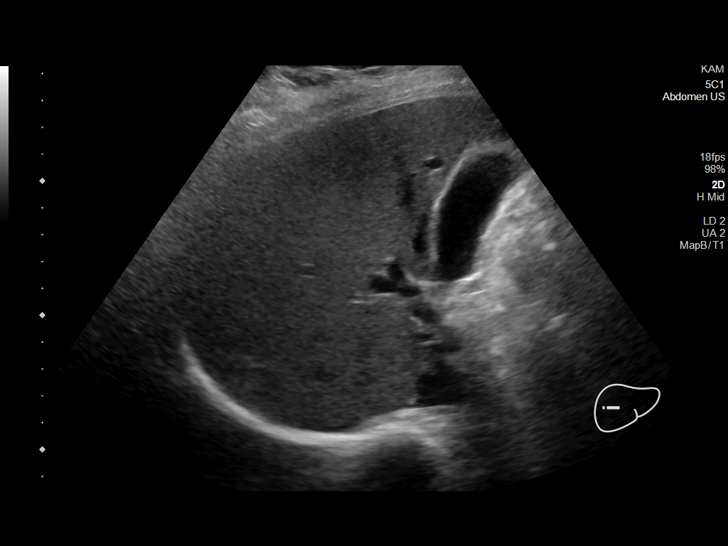
[im 71/86]
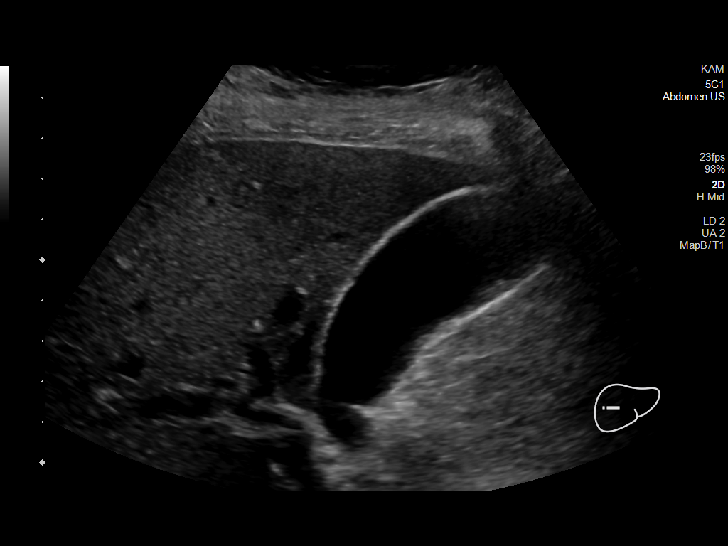
[im 78/86]
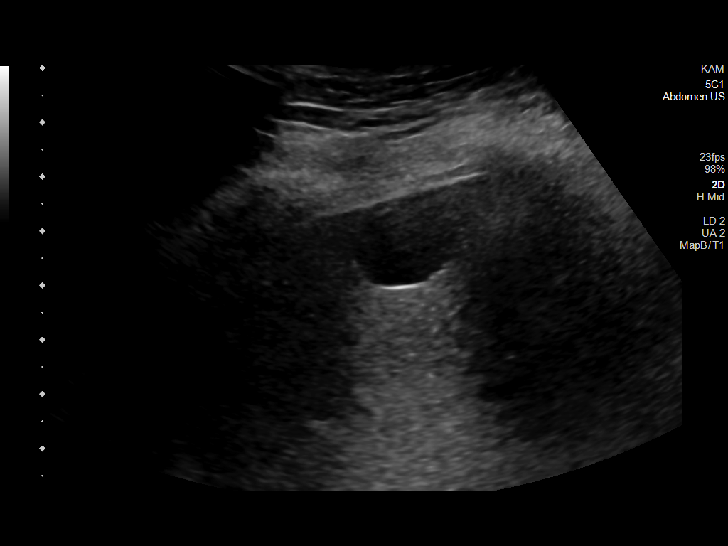
[im 86/86]
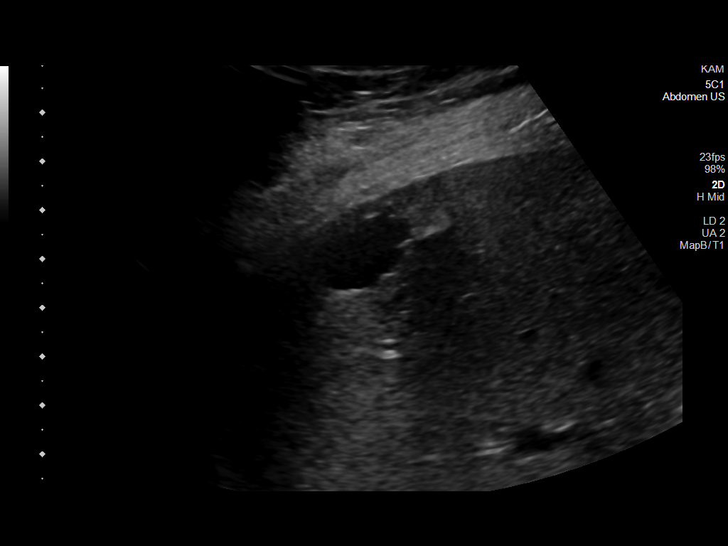

[14 of 25 positions shown; findings below may reference images not displayed]

FINDINGS: Gallbladder:

No gallstones or wall thickening visualized. No sonographic Murphy
sign noted by sonographer.

Common bile duct:

Diameter: 2 mm

Liver:

Normal size and echogenicity. Several cysts. Anterior dome of the
right lobe cyst measuring 2.3 x 1.6 x 2.1 cm. Cyst near the caudate
lobe measuring 1.5 x 1.1 x 2.3 cm. No solid masses. Portal vein is
patent on color Doppler imaging with normal direction of blood flow
towards the liver.

Other: None.
IMPRESSION: 1. No acute findings.  Normal gallbladder.  No bile duct dilation.
2. Liver cysts.

## 2021-09-06 IMAGING — CT CT ANGIO CHEST
2 of 7 series · 13 of 36 positions shown · IV contrast (omnipaque)
Comparison: Chest radiograph, 10/13/2020.

CLINICAL DATA: Four week history of cough.  Fever this week.

EXAM:
CT ANGIOGRAPHY CHEST WITH CONTRAST
TECHNIQUE: Multidetector CT imaging of the chest was performed using the
standard protocol during bolus administration of intravenous
contrast. Multiplanar CT image reconstructions and MIPs were
obtained to evaluate the vascular anatomy.
CONTRAST:  75mL OMNIPAQUE IOHEXOL 350 MG/ML SOLN

[Series 6: pe axial thins · axial · 0.77mm/px · z∈[+1117,+1395]mm · 12 of 330 slices shown]
[im 26/330  lung]
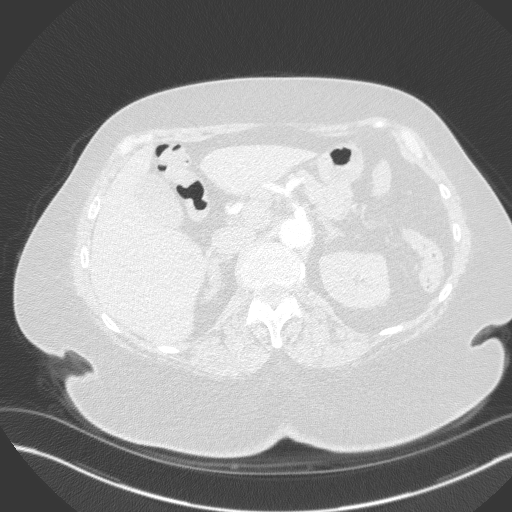
[im 51/330  mediastinal]
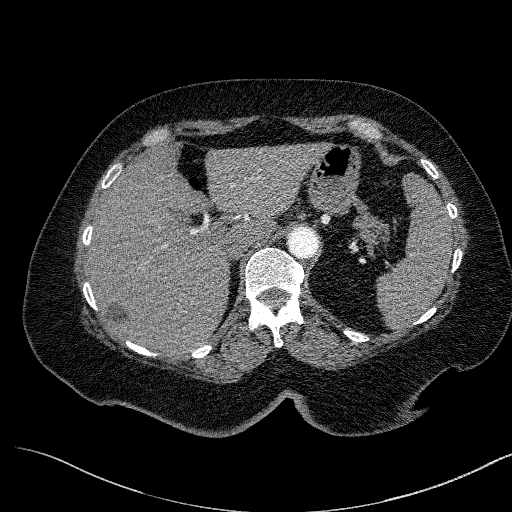
[im 76/330  lung]
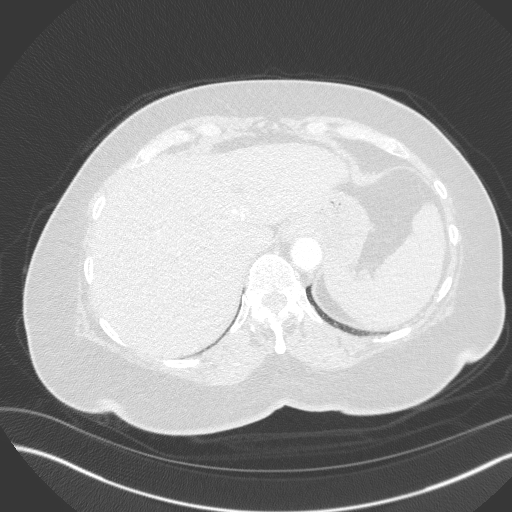
[im 102/330  mediastinal]
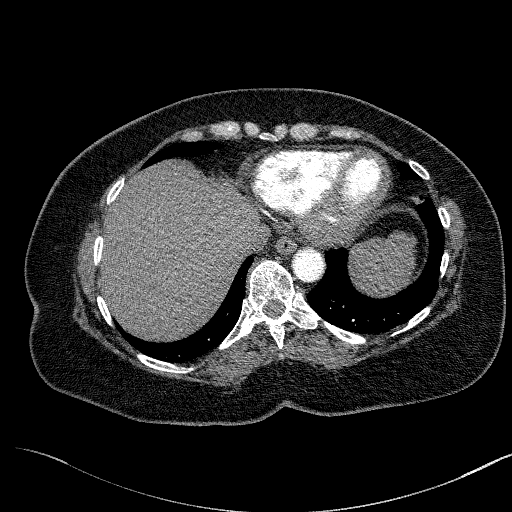
[im 127/330  lung]
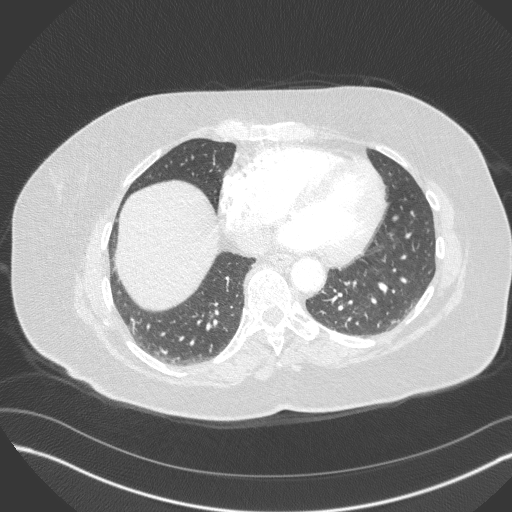
[im 152/330  mediastinal]
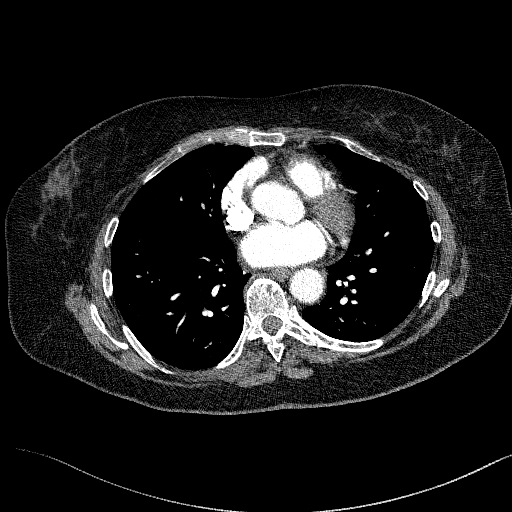
[im 178/330  lung]
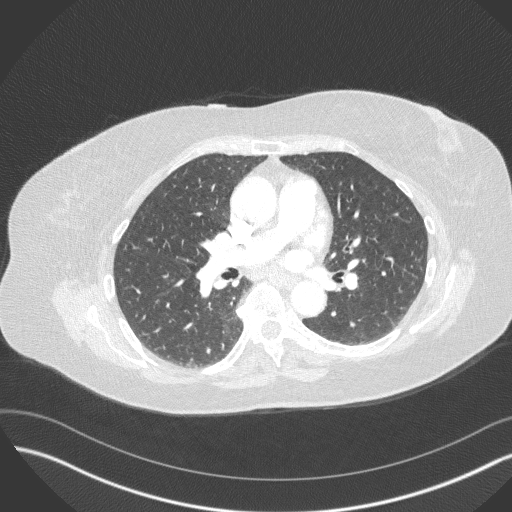
[im 203/330  mediastinal]
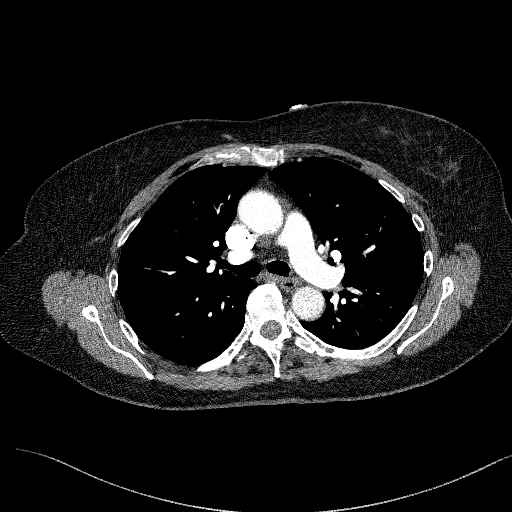
[im 228/330  lung]
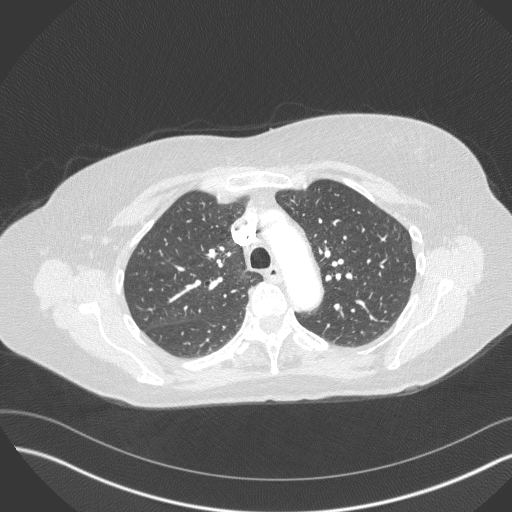
[im 254/330  mediastinal]
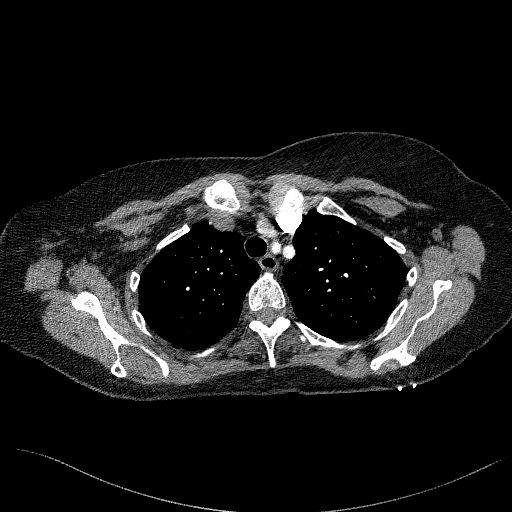
[im 279/330  lung]
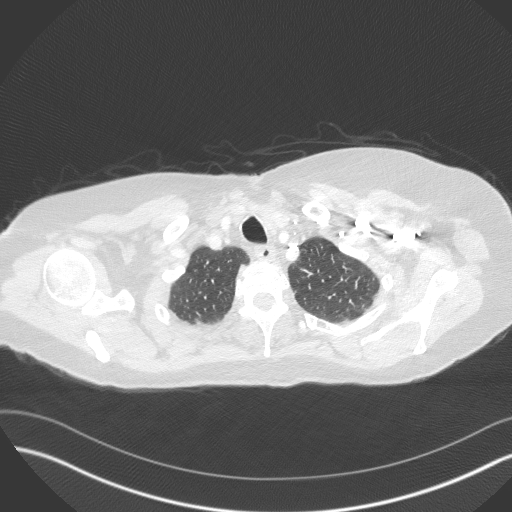
[im 304/330  mediastinal]
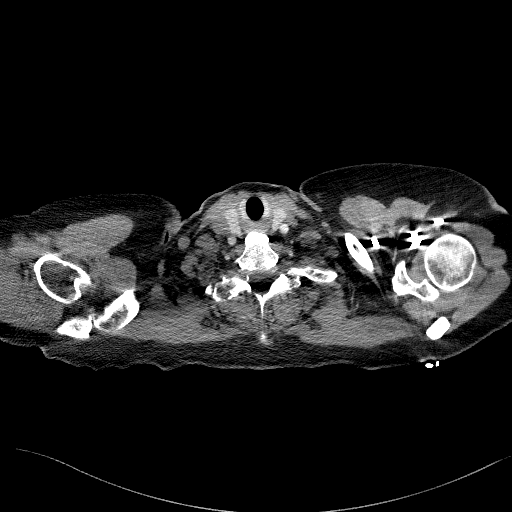

[Series 8: cor soft · coronal · 0.67mm/px · 1 of 120 slices shown]
[im 60/120  mediastinal]
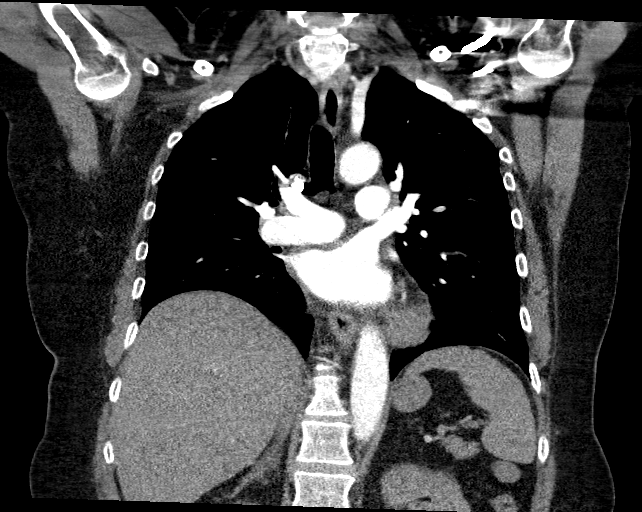

[13 of 36 positions shown; findings below may reference images not displayed]

FINDINGS: Cardiovascular: Pulmonary arteries well opacified. No evidence of a
pulmonary embolism.

Heart is normal in size and configuration. No pericardial effusion.
No significant coronary artery calcifications. Great vessels are
normal in caliber. No aortic atherosclerosis or dissection.

Mediastinum/Nodes: No enlarged mediastinal, hilar, or axillary lymph
nodes. Thyroid gland, trachea, and esophagus demonstrate no
significant findings.

Lungs/Pleura: Lungs are clear. No pleural effusion or pneumothorax.

Upper Abdomen: No acute findings. Several low-density liver lesions,
not fully characterize, likely cysts.

Musculoskeletal: No fracture or acute finding. No bone lesion. Mild
degenerative changes throughout the thoracic spine.

Review of the MIP images confirms the above findings.
IMPRESSION: 1. No evidence of a pulmonary embolism.
2. No acute findings.  Clear lungs.

## 2021-09-08 ENCOUNTER — Encounter: Payer: Self-pay | Admitting: Obstetrics & Gynecology

## 2021-09-08 ENCOUNTER — Ambulatory Visit (INDEPENDENT_AMBULATORY_CARE_PROVIDER_SITE_OTHER): Payer: PPO | Admitting: Obstetrics & Gynecology

## 2021-09-08 VITALS — BP 108/70 | HR 72 | Resp 16 | Ht 65.25 in | Wt 194.0 lb

## 2021-09-08 DIAGNOSIS — Z6832 Body mass index (BMI) 32.0-32.9, adult: Secondary | ICD-10-CM

## 2021-09-08 DIAGNOSIS — Z01419 Encounter for gynecological examination (general) (routine) without abnormal findings: Secondary | ICD-10-CM | POA: Diagnosis not present

## 2021-09-08 DIAGNOSIS — E6609 Other obesity due to excess calories: Secondary | ICD-10-CM

## 2021-09-08 DIAGNOSIS — M85852 Other specified disorders of bone density and structure, left thigh: Secondary | ICD-10-CM

## 2021-09-08 DIAGNOSIS — Z780 Asymptomatic menopausal state: Secondary | ICD-10-CM

## 2021-09-08 DIAGNOSIS — Z90710 Acquired absence of both cervix and uterus: Secondary | ICD-10-CM

## 2021-09-08 DIAGNOSIS — N8110 Cystocele, unspecified: Secondary | ICD-10-CM

## 2021-09-08 NOTE — Progress Notes (Signed)
Amanda Eaton 1956-04-01 [id number]   History:    66 y.o. G3P3L3 Married.  4 grand-children   RP:  Established patient presenting for annual gyn exam    HPI: S/P Total Hysterectomy for prolapse.  Postmenopause, well on no HRT.  No pelvic pain. No pain with IC. Pap Neg 2018.  No h/o abnormal Pap.  Urine/BMs normal. Minimal Sxs from Cystocele. Breasts normal.  Mammo 10/2020 Neg.  BMI 32.04.  BMD Osteopenia, Lt Fem Neck T-Score -1.7 on 10-14-19. Health labs with Fam MD.  DM on Janumet.  cHTN on Lisinopril/HCT. Hypercholesterolemia on Zocor.  Colono 06/2015 normal.   Past medical history,surgical history, family history and social history were all reviewed and documented in the EPIC chart.  Gynecologic History No LMP recorded. Patient has had a hysterectomy.  Obstetric History OB History  Gravida Para Term Preterm AB Living  '3 3 3     3  '$ SAB IAB Ectopic Multiple Live Births               # Outcome Date GA Lbr Len/2nd Weight Sex Delivery Anes PTL Lv  3 Term           2 Term           1 Term              ROS: A ROS was performed and pertinent positives and negatives are included in the history. GENERAL: No fevers or chills. HEENT: No change in vision, no earache, sore throat or sinus congestion. NECK: No pain or stiffness. CARDIOVASCULAR: No chest pain or pressure. No palpitations. PULMONARY: No shortness of breath, cough or wheeze. GASTROINTESTINAL: No abdominal pain, nausea, vomiting or diarrhea, melena or bright red blood per rectum. GENITOURINARY: No urinary frequency, urgency, hesitancy or dysuria. MUSCULOSKELETAL: No joint or muscle pain, no back pain, no recent trauma. DERMATOLOGIC: No rash, no itching, no lesions. ENDOCRINE: No polyuria, polydipsia, no heat or cold intolerance. No recent change in weight. HEMATOLOGICAL: No anemia or easy bruising or bleeding. NEUROLOGIC: No headache, seizures, numbness, tingling or weakness. PSYCHIATRIC: No depression, no loss of interest in normal  activity or change in sleep pattern.     Exam:   BP 108/70   Pulse 72   Resp 16   Ht 5' 5.25" (1.657 m)   Wt 194 lb (88 kg)   BMI 32.04 kg/m   Body mass index is 32.04 kg/m.  General appearance : Well developed well nourished female. No acute distress HEENT: Eyes: no retinal hemorrhage or exudates,  Neck supple, trachea midline, no carotid bruits, no thyroidmegaly Lungs: Clear to auscultation, no rhonchi or wheezes, or rib retractions  Heart: Regular rate and rhythm, no murmurs or gallops Breast:Examined in sitting and supine position were symmetrical in appearance, no palpable masses or tenderness,  no skin retraction, no nipple inversion, no nipple discharge, no skin discoloration, no axillary or supraclavicular lymphadenopathy Abdomen: no palpable masses or tenderness, no rebound or guarding Extremities: no edema or skin discoloration or tenderness  Pelvic: Vulva: Normal             Vagina: No gross lesions or discharge  Cervix/Uterus absent  Adnexa  Without masses or tenderness  Anus: Normal   Assessment/Plan:  66 y.o. female for annual exam   1. Well female exam with routine gynecological exam S/P Total Hysterectomy for prolapse.  Postmenopause, well on no HRT.  No pelvic pain. No pain with IC. Pap Neg 2018.  No h/o abnormal Pap.  Urine/BMs normal. Minimal Sxs from Cystocele. Breasts normal.  Mammo 10/2020 Neg.  BMI 32.04.  BMD Osteopenia, Lt Fem Neck T-Score -1.7 on 10-14-19. Health labs with Fam MD.  DM on Janumet.  cHTN on Lisinopril/HCT. Hypercholesterolemia on Zocor.  Colono 06/2015 normal.  2. S/P total hysterectomy  3. Postmenopause S/P Total Hysterectomy for prolapse.  Postmenopause, well on no HRT.  No pelvic pain. No pain with IC.  4. Osteopenia of neck of left femur BMI 32.04.  BMD Osteopenia, Lt Fem Neck T-Score -1.7 on 10-14-19. Schedule BD here now.  Ca++, Vit D, continue weight bearing physical activities.  5. Baden-Walker grade 3 cystocele Asymptomatic.   Will observe.  6. Class 1 obesity due to excess calories with serious comorbidity and body mass index (BMI) of 32.0 to 32.9 in adult   Princess Bruins MD, 8:10 AM 09/08/2021

## 2021-09-27 DIAGNOSIS — H5203 Hypermetropia, bilateral: Secondary | ICD-10-CM | POA: Diagnosis not present

## 2021-09-27 DIAGNOSIS — H524 Presbyopia: Secondary | ICD-10-CM | POA: Diagnosis not present

## 2021-09-27 DIAGNOSIS — H59812 Chorioretinal scars after surgery for detachment, left eye: Secondary | ICD-10-CM | POA: Diagnosis not present

## 2021-09-27 DIAGNOSIS — H52223 Regular astigmatism, bilateral: Secondary | ICD-10-CM | POA: Diagnosis not present

## 2021-09-27 DIAGNOSIS — E119 Type 2 diabetes mellitus without complications: Secondary | ICD-10-CM | POA: Diagnosis not present

## 2021-09-27 DIAGNOSIS — H35372 Puckering of macula, left eye: Secondary | ICD-10-CM | POA: Diagnosis not present

## 2021-09-27 LAB — HM DIABETES EYE EXAM

## 2021-10-07 ENCOUNTER — Encounter: Payer: Self-pay | Admitting: Adult Health

## 2021-11-15 ENCOUNTER — Other Ambulatory Visit: Payer: Self-pay | Admitting: Obstetrics & Gynecology

## 2021-11-15 ENCOUNTER — Ambulatory Visit (INDEPENDENT_AMBULATORY_CARE_PROVIDER_SITE_OTHER): Payer: PPO

## 2021-11-15 DIAGNOSIS — Z13820 Encounter for screening for osteoporosis: Secondary | ICD-10-CM

## 2021-11-15 DIAGNOSIS — Z780 Asymptomatic menopausal state: Secondary | ICD-10-CM

## 2021-11-15 DIAGNOSIS — M85852 Other specified disorders of bone density and structure, left thigh: Secondary | ICD-10-CM

## 2021-11-22 DIAGNOSIS — Z1231 Encounter for screening mammogram for malignant neoplasm of breast: Secondary | ICD-10-CM | POA: Diagnosis not present

## 2021-11-22 LAB — HM MAMMOGRAPHY

## 2021-11-24 ENCOUNTER — Encounter: Payer: Self-pay | Admitting: Obstetrics & Gynecology

## 2021-11-25 ENCOUNTER — Encounter: Payer: Self-pay | Admitting: Adult Health

## 2021-12-09 ENCOUNTER — Ambulatory Visit (INDEPENDENT_AMBULATORY_CARE_PROVIDER_SITE_OTHER): Payer: PPO

## 2021-12-09 VITALS — Ht 62.0 in | Wt 194.0 lb

## 2021-12-09 DIAGNOSIS — Z0000 Encounter for general adult medical examination without abnormal findings: Secondary | ICD-10-CM | POA: Diagnosis not present

## 2021-12-09 NOTE — Progress Notes (Signed)
Subjective:   Amanda Eaton is a 66 y.o. female who presents for Medicare Annual (Subsequent) preventive examination.  Review of Systems    Virtual Visit via Telephone Note  I connected with  Amanda Eaton on 12/09/21 at 11:30 AM EDT by telephone and verified that I am speaking with the correct person using two identifiers.  Location: Patient: Home Provider: Office Persons participating in the virtual visit: patient/Nurse Health Advisor   I discussed the limitations, risks, security and privacy concerns of performing an evaluation and management service by telephone and the availability of in person appointments. The patient expressed understanding and agreed to proceed.  Interactive audio and video telecommunications were attempted between this nurse and patient, however failed, due to patient having technical difficulties OR patient did not have access to video capability.  We continued and completed visit with audio only.  Some vital signs may be absent or patient reported.   Amanda Peaches, LPN  Cardiac Risk Factors include: advanced age (>10mn, >>5women);diabetes mellitus;hypertension     Objective:    Today's Vitals   12/09/21 1139  Weight: 194 lb (88 kg)  Height: '5\' 2"'$  (1.575 m)   Body mass index is 35.48 kg/m.     12/09/2021   11:47 AM 12/08/2020    8:24 AM 10/16/2020   10:57 AM 03/15/2016    8:45 AM 06/14/2015    8:05 AM 11/30/2014   12:43 PM 07/17/2012    6:30 PM  Advanced Directives  Does Patient Have a Medical Advance Directive? No No No No No No Patient does not have advance directive  Would patient like information on creating a medical advance directive? No - Patient declined No - Patient declined       Pre-existing out of facility DNR order (yellow form or pink MOST form)       No    Current Medications (verified) Outpatient Encounter Medications as of 12/09/2021  Medication Sig   ibuprofen (ADVIL,MOTRIN) 200 MG tablet Take 400 mg by mouth every 6  (six) hours as needed for moderate pain.   JANUMET XR [phone] MG TB24 TAKE ONE TABLET BY MOUTH DAILY   lisinopril-hydrochlorothiazide (ZESTORETIC) 10-12.5 MG tablet TAKE 1/2 TABLET BY MOUTH DAILY   Multiple Vitamins-Minerals (WOMENS MULTI VITAMIN & MINERAL PO) Take 1 tablet by mouth 2 (two) times daily.   simvastatin (ZOCOR) 5 MG tablet TAKE ONE TABLET BY MOUTH DAILY   TURMERIC PO Take 3 tablets by mouth. Takes 2 in the am & 1 in the afternoon   No facility-administered encounter medications on file as of 12/09/2021.    Allergies (verified) Patient has no known allergies.   History: Past Medical History:  Diagnosis Date   DIABETES MELLITUS, TYPE II 11/23/2006   HYPERLIPIDEMIA, BORDERLINE 04/08/2008   HYPERTENSION 11/23/2006   Lyme disease    WEIGHT GAIN 10/12/2008   Past Surgical History:  Procedure Laterality Date   ABDOMINAL HYSTERECTOMY     ANTERIOR AND POSTERIOR REPAIR N/A 07/17/2012   Procedure: ANTERIOR (CYSTOCELE) AND POSTERIOR REPAIR (RECTOCELE);  Surgeon: MPrincess Bruins MD;  [facility];  Service: Gynecology;  Laterality: N/A;   CATARACT EXTRACTION  04/24/2013   HYSTEROSCOPY W/ ENDOMETRIAL ABLATION     1987   MYOMECTOMY VAGINAL APPROACH     ROBOTIC ASSISTED TOTAL HYSTERECTOMY N/A 07/17/2012   Procedure: ROBOTIC ASSISTED TOTAL HYSTERECTOMY with Utero-sacral Ligament Suspension;  Surgeon: MPrincess Bruins MD;  [facility];  Service: Gynecology;  Laterality: N/A;   Family History  Problem  Relation Age of Onset   Diabetes Mother    Hyperlipidemia Mother    Hypertension Mother    COPD Father    Lung cancer Brother        lung   Diabetes Maternal Grandfather    Cancer Daughter    Colon cancer Neg Hx    Social History   Socioeconomic History   Marital status: Married    Spouse name: Not on file   Number of children: Not on file   Years of education: Not on file   Highest education level: Not on file  Occupational History   Not on file  Tobacco  Use   Smoking status: Never   Smokeless tobacco: Never  Vaping Use   Vaping Use: Never used  Substance and Sexual Activity   Alcohol use: No    Alcohol/week: 0.0 standard drinks of alcohol   Drug use: No   Sexual activity: Yes    Partners: Male    Birth control/protection: Surgical    Comment: 1st intercourse- 20, less than 5, married- 15 yrs, hysterectomy  Other Topics Concern   Not on file  Social History Narrative   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 All live locally    Social Determinants of Health   Financial Resource Strain: Low Risk  (12/09/2021)   Overall Financial Resource Strain (CARDIA)    Difficulty of Paying Living Expenses: Not hard at all  Food Insecurity: No Food Insecurity (12/09/2021)   Hunger Vital Sign    Worried About Running Out of Food in the Last Year: Never true    Ran Out of Food in the Last Year: Never true  Transportation Needs: No Transportation Needs (12/09/2021)   PRAPARE - Hydrologist (Medical): No    Lack of Transportation (Non-Medical): No  Physical Activity: Inactive (12/09/2021)   Exercise Vital Sign    Days of Exercise per Week: 0 days    Minutes of Exercise per Session: 0 min  Stress: No Stress Concern Present (12/09/2021)   [place]    Feeling of Stress : Not at all  Social Connections: Moderately Isolated (12/09/2021)   Social Connection and Isolation Panel [NHANES]    Frequency of Communication with Friends and Family: More than three times a week    Frequency of Social Gatherings with Friends and Family: More than three times a week    Attends Religious Services: Never    Marine scientist or Organizations: No    Attends Music therapist: Never    Marital Status: Married    Tobacco Counseling Counseling given: Not Answered   Clinical  Intake:  Pre-visit preparation completed: NoNutrition Risk Assessment:  Has the patient had any N/V/D within the last 2 months?  No  Does the patient have any non-healing wounds?  No  Has the patient had any unintentional weight loss or weight gain?  No   Diabetes:  Is the patient diabetic?  Yes  If diabetic, was a CBG obtained today?  No  Did the patient bring in their glucometer from home?  No  How often do you monitor your CBG's? Every other day.   Financial Strains and Diabetes Management:  Are you having any financial strains with the device, your supplies or your medication? No .  Does the patient want to  be seen by Chronic Care Management for management of their diabetes?  No  Would the patient like to be referred to a Nutritionist or for Diabetic Management?  No   Diabetic Exams:  Diabetic Eye Exam: Completed No. Overdue for diabetic eye exam. Pt has been advised about the importance in completing this exam. A referral has been placed today. Message sent to referral coordinator for scheduling purposes. Advised pt to expect a call from office referred to regarding appt.  Diabetic Foot Exam: Completed No. Pt has been advised about the importance in completing this exam. Pt is scheduled for diabetic foot exam on Followed by PCP.    Pain : No/denies pain  Diabetic?  Yes   Activities of Daily Living    12/09/2021   11:45 AM 03/03/2021    7:26 AM  In your present state of health, do you have any difficulty performing the following activities:  Hearing? 0 0  Vision? 0 0  Difficulty concentrating or making decisions? 0 0  Walking or climbing stairs? 0 0  Dressing or bathing? 0 0  Doing errands, shopping? 0 0  Preparing Food and eating ? N   Using the Toilet? N   In the past six months, have you accidently leaked urine? N   Do you have problems with loss of bowel control? N   Managing your Medications? N   Managing your Finances? N   Housekeeping or managing your  Housekeeping? N     Patient Care Team: Dorothyann Peng, NP as PCP - General (Family Medicine) Princess Bruins, MD as Consulting Physician (Obstetrics and Gynecology)  Indicate any recent Medical Services you may have received from other than [facility] providers in the past year (date may be approximate).     Assessment:   This is a routine wellness examination for Amanda Eaton.  Hearing/Vision screen Hearing Screening - Comments:: No hearing difficulty Vision Screening - Comments:: Wears glasses. Followed by Dr Clydene Laming  Dietary issues and exercise activities discussed: Exercise limited by: None identified   Goals Addressed               This Visit's Progress     Lose weight (pt-stated)        Start back walking and lose weight.       Depression Screen    12/09/2021   11:43 AM 03/03/2021    7:26 AM 12/08/2020    8:27 AM 12/08/2020    8:23 AM 12/16/2019    7:16 AM 04/09/2018    7:50 AM 02/05/2018    7:33 AM  PHQ 2/9 Scores  PHQ - 2 Score 0 0 0 0 0 0 0  PHQ- 9 Score     0  0    Fall Risk    12/09/2021   11:46 AM 12/08/2020    8:24 AM 12/16/2019    7:16 AM 04/09/2018    7:50 AM 03/15/2016    8:45 AM  Fall Risk   Falls in the past year? 0 0 0 0 No  Number falls in past yr: 0 0 0    Injury with Fall? 0 0 0    Risk for fall due to : No Fall Risks    Other (Comment)  Follow up  Falls evaluation completed       FALL RISK PREVENTION PERTAINING TO THE HOME:  Any stairs in or around the home? Yes  If so, are there any without handrails? No  Home free of loose throw rugs in walkways,  pet beds, electrical cords, etc? Yes  Adequate lighting in your home to reduce risk of falls? Yes   ASSISTIVE DEVICES UTILIZED TO PREVENT FALLS:  Life alert? No  Use of a cane, walker or w/c? No  Grab bars in the bathroom? No  Shower chair or bench in shower? No  Elevated toilet seat or a handicapped toilet? No   TIMED UP AND GO:  Was the test performed? No . Audio Visit   Cognitive  Function:        12/09/2021   11:48 AM  6CIT Screen  What Year? 0 points  What month? 0 points  What time? 0 points  Count back from 20 0 points  Months in reverse 0 points  Repeat phrase 0 points  Total Score 0 points  Immunizations Immunization History  Administered Date(s) Administered   Fluad Quad(high Dose 65+) 01/23/2020   Influenza Split 05/01/2012   Influenza Whole 04/03/2007, 01/14/2009, 02/28/2010   Influenza, Quadrivalent, Recombinant, Inj, Pf 01/14/2019   Influenza, Seasonal, Injecte, Preservative Fre 12/14/2014   Influenza,inj,Quad PF,6+ Mos 03/03/2013, 03/02/2014, 02/15/2016, 03/06/2017, 02/05/2018, 03/03/2021   Moderna Covid Bivalent Peds Booster(58moThru 563yr 03/24/2021   Moderna Sars-Covid-2 Vaccination 07/24/2019, 08/16/2019, 02/20/2020   Pneumococcal Conjugate-13 12/14/2014   Pneumococcal Polysaccharide-23 07/18/2012, 06/22/2020   Td 01/03/2007   Tdap 03/06/2017   Zoster Recombinat (Shingrix) 03/10/2021, 07/07/2021    TDAP status: Up to date  Flu Vaccine status: Up to date  Pneumococcal vaccine status: Up to date  Covid-19 vaccine status: Completed vaccines  Qualifies for Shingles Vaccine? Yes   Zostavax completed Yes   Shingrix Completed?: Yes  Screening Tests Health Maintenance  Topic Date Due   INFLUENZA VACCINE  11/22/2021   COVID-19 Vaccine (5 - Moderna series) 12/25/2021 (Originally 07/23/2021)   FOOT EXAM  03/03/2022   HEMOGLOBIN A1C  03/03/2022   OPHTHALMOLOGY EXAM  09/28/2022   MAMMOGRAM  11/25/2022   COLONOSCOPY (Pts 45-495yrnsurance coverage will need to be confirmed)  06/27/2025   TETANUS/TDAP  03/07/2027   Pneumonia Vaccine 65+50ears old  Completed   DEXA SCAN  Completed   Hepatitis C Screening  Completed   Zoster Vaccines- Shingrix  Completed   HPV VACCINES  Aged Out    Health Maintenance  Health Maintenance Due  Topic Date Due   INFLUENZA VACCINE  11/22/2021    Colorectal cancer screening: Type of screening:  Colonoscopy. Completed 06/28/15. Repeat every 10 years  Mammogram status: Completed 11/24/21. Repeat every year  Bone Density status: Completed 11/15/21. Results reflect: Bone density results: OSTEOPOROSIS. Repeat every   years.  Lung Cancer Screening: (Low Dose CT Chest recommended if Age 54-62-80ars, 30 pack-year currently smoking OR have quit w/in 15years.) does not qualify.     Additional Screening:  Hepatitis C Screening: does qualify; Completed 04/03/18  Vision Screening: Recommended annual ophthalmology exams for early detection of glaucoma and other disorders of the eye. Is the patient up to date with their annual eye exam?  Yes  Who is the provider or what is the name of the office in which the patient attends annual eye exams? Dr WooClydene Laming pt is not established with a provider, would they like to be referred to a provider to establish care? No .   Dental Screening: Recommended annual dental exams for proper oral hygiene  Community Resource Referral / Chronic Care Management:  CRR required this visit?  No   CCM required this visit?  No      Plan:  I have personally reviewed and noted the following in the patient's chart:   Medical and social history Use of alcohol, tobacco or illicit drugs  Current medications and supplements including opioid prescriptions. Patient is not currently taking opioid prescriptions. Functional ability and status Nutritional status Physical activity Advanced directives List of other physicians Hospitalizations, surgeries, and ER visits in previous 12 months Vitals Screenings to include cognitive, depression, and falls Referrals and appointments  In addition, I have reviewed and discussed with patient certain preventive protocols, quality metrics, and best practice recommendations. A written personalized care plan for preventive services as well as general preventive health recommendations were provided to patient.     Amanda Peaches, LPN   01/20/902   Nurse Notes: None

## 2021-12-09 NOTE — Patient Instructions (Addendum)
Amanda Eaton , Thank you for taking time to come for your Medicare Wellness Visit. I appreciate your ongoing commitment to your health goals. Please review the following plan we discussed and let me know if I can assist you in the future.   These are the goals we discussed:  Goals       Lose weight (pt-stated)      Start back walking and lose weight.      Reduce fast food intake      Reduce portion size        This is a list of the screening recommended for you and due dates:  Health Maintenance  Topic Date Due   Flu Shot  11/22/2021   COVID-19 Vaccine (5 - Moderna series) 12/25/2021*   Complete foot exam   03/03/2022   Hemoglobin A1C  03/03/2022   Eye exam for diabetics  09/28/2022   Mammogram  11/25/2022   Colon Cancer Screening  06/27/2025   Tetanus Vaccine  03/07/2027   Pneumonia Vaccine  Completed   DEXA scan (bone density measurement)  Completed   Hepatitis C Screening: USPSTF Recommendation to screen - Ages 70-79 yo.  Completed   Zoster (Shingles) Vaccine  Completed   HPV Vaccine  Aged Out  *Topic was postponed. The date shown is not the original due date.   Advanced directives: No  Conditions/risks identified: None  Next appointment: Follow up in one year for your annual wellness visit     Preventive Care 65 Years and Older, Female Preventive care refers to lifestyle choices and visits with your health care provider that can promote health and wellness. What does preventive care include? A yearly physical exam. This is also called an annual well check. Dental exams once or twice a year. Routine eye exams. Ask your health care provider how often you should have your eyes checked. Personal lifestyle choices, including: Daily care of your teeth and gums. Regular physical activity. Eating a healthy diet. Avoiding tobacco and drug use. Limiting alcohol use. Practicing safe sex. Taking low-dose aspirin every day. Taking vitamin and mineral supplements as  recommended by your health care provider. What happens during an annual well check? The services and screenings done by your health care provider during your annual well check will depend on your age, overall health, lifestyle risk factors, and family history of disease. Counseling  Your health care provider may ask you questions about your: Alcohol use. Tobacco use. Drug use. Emotional well-being. Home and relationship well-being. Sexual activity. Eating habits. History of falls. Memory and ability to understand (cognition). Work and work Statistician. Reproductive health. Screening  You may have the following tests or measurements: Height, weight, and BMI. Blood pressure. Lipid and cholesterol levels. These may be checked every 5 years, or more frequently if you are over 51 years old. Skin check. Lung cancer screening. You may have this screening every year starting at age 40 if you have a 30-pack-year history of smoking and currently smoke or have quit within the past 15 years. Fecal occult blood test (FOBT) of the stool. You may have this test every year starting at age 46. Flexible sigmoidoscopy or colonoscopy. You may have a sigmoidoscopy every 5 years or a colonoscopy every 10 years starting at age 57. Hepatitis C blood test. Hepatitis B blood test. Sexually transmitted disease (STD) testing. Diabetes screening. This is done by checking your blood sugar (glucose) after you have not eaten for a while (fasting). You may have this done every  1-3 years. Bone density scan. This is done to screen for osteoporosis. You may have this done starting at age 42. Mammogram. This may be done every 1-2 years. Talk to your health care provider about how often you should have regular mammograms. Talk with your health care provider about your test results, treatment options, and if necessary, the need for more tests. Vaccines  Your health care provider may recommend certain vaccines, such  as: Influenza vaccine. This is recommended every year. Tetanus, diphtheria, and acellular pertussis (Tdap, Td) vaccine. You may need a Td booster every 10 years. Zoster vaccine. You may need this after age 40. Pneumococcal 13-valent conjugate (PCV13) vaccine. One dose is recommended after age 68. Pneumococcal polysaccharide (PPSV23) vaccine. One dose is recommended after age 13. Talk to your health care provider about which screenings and vaccines you need and how often you need them. This information is not intended to replace advice given to you by your health care provider. Make sure you discuss any questions you have with your health care provider. Document Released: 05/07/2015 Document Revised: 12/29/2015 Document Reviewed: 02/09/2015 Elsevier Interactive Patient Education  2017 [employer] Prevention in the Home Falls can cause injuries. They can happen to people of all ages. There are many things you can do to make your home safe and to help prevent falls. What can I do on the outside of my home? Regularly fix the edges of walkways and driveways and fix any cracks. Remove anything that might make you trip as you walk through a door, such as a raised step or threshold. Trim any bushes or trees on the path to your home. Use bright outdoor lighting. Clear any walking paths of anything that might make someone trip, such as rocks or tools. Regularly check to see if handrails are loose or broken. Make sure that both sides of any steps have handrails. Any raised decks and porches should have guardrails on the edges. Have any leaves, snow, or ice cleared regularly. Use sand or salt on walking paths during winter. Clean up any spills in your garage right away. This includes oil or grease spills. What can I do in the bathroom? Use night lights. Install grab bars by the toilet and in the tub and shower. Do not use towel bars as grab bars. Use non-skid mats or decals in the tub or  shower. If you need to sit down in the shower, use a plastic, non-slip stool. Keep the floor dry. Clean up any water that spills on the floor as soon as it happens. Remove soap buildup in the tub or shower regularly. Attach bath mats securely with double-sided non-slip rug tape. Do not have throw rugs and other things on the floor that can make you trip. What can I do in the bedroom? Use night lights. Make sure that you have a light by your bed that is easy to reach. Do not use any sheets or blankets that are too big for your bed. They should not hang down onto the floor. Have a firm chair that has side arms. You can use this for support while you get dressed. Do not have throw rugs and other things on the floor that can make you trip. What can I do in the kitchen? Clean up any spills right away. Avoid walking on wet floors. Keep items that you use a lot in easy-to-reach places. If you need to reach something above you, use a strong step stool that has a  grab bar. Keep electrical cords out of the way. Do not use floor polish or wax that makes floors slippery. If you must use wax, use non-skid floor wax. Do not have throw rugs and other things on the floor that can make you trip. What can I do with my stairs? Do not leave any items on the stairs. Make sure that there are handrails on both sides of the stairs and use them. Fix handrails that are broken or loose. Make sure that handrails are as long as the stairways. Check any carpeting to make sure that it is firmly attached to the stairs. Fix any carpet that is loose or worn. Avoid having throw rugs at the top or bottom of the stairs. If you do have throw rugs, attach them to the floor with carpet tape. Make sure that you have a light switch at the top of the stairs and the bottom of the stairs. If you do not have them, ask someone to add them for you. What else can I do to help prevent falls? Wear shoes that: Do not have high heels. Have  rubber bottoms. Are comfortable and fit you well. Are closed at the toe. Do not wear sandals. If you use a stepladder: Make sure that it is fully opened. Do not climb a closed stepladder. Make sure that both sides of the stepladder are locked into place. Ask someone to hold it for you, if possible. Clearly mark and make sure that you can see: Any grab bars or handrails. First and last steps. Where the edge of each step is. Use tools that help you move around (mobility aids) if they are needed. These include: Canes. Walkers. Scooters. Crutches. Turn on the lights when you go into a dark area. Replace any light bulbs as soon as they burn out. Set up your furniture so you have a clear path. Avoid moving your furniture around. If any of your floors are uneven, fix them. If there are any pets around you, be aware of where they are. Review your medicines with your doctor. Some medicines can make you feel dizzy. This can increase your chance of falling. Ask your doctor what other things that you can do to help prevent falls. This information is not intended to replace advice given to you by your health care provider. Make sure you discuss any questions you have with your health care provider. Document Released: 02/04/2009 Document Revised: 09/16/2015 Document Reviewed: 05/15/2014 Elsevier Interactive Patient Education  2017 [employer].

## 2021-12-11 ENCOUNTER — Other Ambulatory Visit: Payer: Self-pay | Admitting: Adult Health

## 2021-12-11 DIAGNOSIS — I10 Essential (primary) hypertension: Secondary | ICD-10-CM

## 2022-01-21 ENCOUNTER — Other Ambulatory Visit: Payer: Self-pay | Admitting: Adult Health

## 2022-01-21 DIAGNOSIS — E782 Mixed hyperlipidemia: Secondary | ICD-10-CM

## 2022-02-12 ENCOUNTER — Other Ambulatory Visit: Payer: Self-pay | Admitting: Adult Health

## 2022-02-12 DIAGNOSIS — E118 Type 2 diabetes mellitus with unspecified complications: Secondary | ICD-10-CM

## 2022-03-07 ENCOUNTER — Ambulatory Visit (INDEPENDENT_AMBULATORY_CARE_PROVIDER_SITE_OTHER): Payer: PPO | Admitting: Adult Health

## 2022-03-07 ENCOUNTER — Encounter: Payer: Self-pay | Admitting: Adult Health

## 2022-03-07 ENCOUNTER — Telehealth: Payer: Self-pay | Admitting: Adult Health

## 2022-03-07 VITALS — BP 110/80 | HR 90 | Temp 97.8°F | Ht 62.0 in | Wt 199.0 lb

## 2022-03-07 DIAGNOSIS — Z0000 Encounter for general adult medical examination without abnormal findings: Secondary | ICD-10-CM

## 2022-03-07 DIAGNOSIS — E782 Mixed hyperlipidemia: Secondary | ICD-10-CM | POA: Diagnosis not present

## 2022-03-07 DIAGNOSIS — I10 Essential (primary) hypertension: Secondary | ICD-10-CM

## 2022-03-07 DIAGNOSIS — E118 Type 2 diabetes mellitus with unspecified complications: Secondary | ICD-10-CM

## 2022-03-07 DIAGNOSIS — Z23 Encounter for immunization: Secondary | ICD-10-CM

## 2022-03-07 LAB — LIPID PANEL
Cholesterol: 168 mg/dL (ref 0–200)
HDL: 66.6 mg/dL (ref >39.00)
LDL Cholesterol: 88 mg/dL (ref 0–99)
NonHDL: 100.91
Total CHOL/HDL Ratio: 3
Triglycerides: 66 mg/dL (ref 0.0–149.0)
VLDL: 13.2 mg/dL (ref 0.0–40.0)

## 2022-03-07 LAB — COMPREHENSIVE METABOLIC PANEL
ALT: 13 U/L (ref 0–35)
AST: 18 U/L (ref 0–37)
Albumin: 4.2 g/dL (ref 3.5–5.2)
Alkaline Phosphatase: 54 U/L (ref 39–117)
BUN: 25 mg/dL — ABNORMAL HIGH (ref 6–23)
CO2: 30 mEq/L (ref 19–32)
Calcium: 9.6 mg/dL (ref 8.4–10.5)
Chloride: 103 mEq/L (ref 96–112)
Creatinine, Ser: 0.78 mg/dL (ref 0.40–1.20)
GFR: 78.5 mL/min (ref >60.00)
Glucose, Bld: 108 mg/dL — ABNORMAL HIGH (ref 70–99)
Potassium: 4 mEq/L (ref 3.5–5.1)
Sodium: 140 mEq/L (ref 135–145)
Total Bilirubin: 0.6 mg/dL (ref 0.2–1.2)
Total Protein: 6.8 g/dL (ref 6.0–8.3)

## 2022-03-07 LAB — CBC WITH DIFFERENTIAL/PLATELET
Basophils Absolute: 0 10*3/uL (ref 0.0–0.1)
Basophils Relative: 0.4 % (ref 0.0–3.0)
Eosinophils Absolute: 0.1 10*3/uL (ref 0.0–0.7)
Eosinophils Relative: 1.5 % (ref 0.0–5.0)
HCT: 39.3 % (ref 36.0–46.0)
Hemoglobin: 12.8 g/dL (ref 12.0–15.0)
Lymphocytes Relative: 22 % (ref 12.0–46.0)
Lymphs Abs: 1.4 10*3/uL (ref 0.7–4.0)
MCHC: 32.5 g/dL (ref 30.0–36.0)
MCV: 88 fl (ref 78.0–100.0)
Monocytes Absolute: 0.5 10*3/uL (ref 0.1–1.0)
Monocytes Relative: 7 % (ref 3.0–12.0)
Neutro Abs: 4.5 10*3/uL (ref 1.4–7.7)
Neutrophils Relative %: 69.1 % (ref 43.0–77.0)
Platelets: 185 10*3/uL (ref 150.0–400.0)
RBC: 4.47 Mil/uL (ref 3.87–5.11)
RDW: 13.6 % (ref 11.5–15.5)
WBC: 6.5 10*3/uL (ref 4.0–10.5)

## 2022-03-07 LAB — MICROALBUMIN / CREATININE URINE RATIO
Creatinine,U: 158.6 mg/dL
Microalb Creat Ratio: 1.7 mg/g (ref 0.0–30.0)
Microalb, Ur: 2.7 mg/dL — ABNORMAL HIGH (ref 0.0–1.9)

## 2022-03-07 LAB — HEMOGLOBIN A1C: Hgb A1c MFr Bld: 6.5 % (ref 4.6–6.5)

## 2022-03-07 LAB — TSH: TSH: 1.28 u[IU]/mL (ref 0.35–5.50)

## 2022-03-07 NOTE — Telephone Encounter (Signed)
Updated patient on her labs

## 2022-03-07 NOTE — Patient Instructions (Addendum)
It was great seeing you today   We will follow up with you regarding your lab work   Please let me know if you need anything   I will see you back in a year for your physical exam and 6 months for your diabetes follow up   Please start walking again daily.

## 2022-03-07 NOTE — Progress Notes (Signed)
Subjective:    Patient ID: Amanda Eaton, female    DOB: [birth year], 66 y.o.   MRN: [id number]  HPI Patient presents for yearly preventative medicine examination. He is a pleasant 66 year old female who  has a past medical history of DIABETES MELLITUS, TYPE II (11/23/2006), HYPERLIPIDEMIA, BORDERLINE (04/08/2008), HYPERTENSION (11/23/2006), Lyme disease, and WEIGHT GAIN (10/12/2008).  DM type 2 -managed with Janumet extended release [phone] mg daily. She does monitor her blood sugars at home periodically with readings in the 90s to 110s.  She denies any hypoglycemic episodes.  She is walking more often and tries to eat a heart healthy diet Lab Results  Component Value Date   HGBA1C 5.9 (A) 08/31/2021   Hypertension-managed with Zestoretic 10-12.5 mg daily, she takes a half a tab.  Denies dizziness, lightheadedness, chest pain, shortness of breath, or syncopal episodes BP Readings from Last 3 Encounters:  03/07/22 110/80  09/08/21 108/70  08/31/21 110/82   Hyperlipidemia-managed with simvastatin 5 mg daily.  She denies myalgia or fatigue Lab Results  Component Value Date   CHOL 178 03/03/2021   HDL 64.50 03/03/2021   LDLCALC 101 (H) 03/03/2021   LDLDIRECT 158.8 10/04/2010   TRIG 64.0 03/03/2021   CHOLHDL 3 03/03/2021   All immunizations and health maintenance protocols were reviewed with the patient and needed orders were placed.  Appropriate screening laboratory values were ordered for the patient including screening of hyperlipidemia, renal function and hepatic function.  Medication reconciliation,  past medical history, social history, problem list and allergies were reviewed in detail with the patient  Goals were established with regard to weight loss, exercise, and  diet in compliance with medications. She has been more sedentary over the last few months then she is accustomed to; her husband has been out of work with a DVT.  Wt Readings from Last 3 Encounters:  03/07/22  199 lb (90.3 kg)  12/09/21 194 lb (88 kg)  09/08/21 194 lb (88 kg)   She is up-to-date with colon cancer screening, mammograms, and GYN care  She has no acute complaints today   Review of Systems  Constitutional: Negative.   HENT: Negative.    Eyes: Negative.   Respiratory: Negative.    Cardiovascular: Negative.   Gastrointestinal: Negative.   Endocrine: Negative.   Genitourinary: Negative.   Musculoskeletal: Negative.   Skin: Negative.   Allergic/Immunologic: Negative.   Neurological: Negative.   Hematological: Negative.   Psychiatric/Behavioral: Negative.     Past Medical History:  Diagnosis Date   DIABETES MELLITUS, TYPE II 11/23/2006   HYPERLIPIDEMIA, BORDERLINE 04/08/2008   HYPERTENSION 11/23/2006   Lyme disease    WEIGHT GAIN 10/12/2008    Social History   Socioeconomic History   Marital status: Married    Spouse name: Not on file   Number of children: Not on file   Years of education: Not on file   Highest education level: Not on file  Occupational History   Not on file  Tobacco Use   Smoking status: Never   Smokeless tobacco: Never  Vaping Use   Vaping Use: Never used  Substance and Sexual Activity   Alcohol use: No    Alcohol/week: 0.0 standard drinks of alcohol   Drug use: No   Sexual activity: Yes    Partners: Male    Birth control/protection: Surgical    Comment: 1st intercourse- 20, less than 5, married- 36 yrs, hysterectomy  Other Topics Concern   Not on file  Social History Narrative  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 All live locally    Social Determinants of Health   Financial Resource Strain: Low Risk  (12/09/2021)   Overall Financial Resource Strain (CARDIA)    Difficulty of Paying Living Expenses: Not hard at all  Food Insecurity: No Food Insecurity (12/09/2021)   Hunger Vital Sign    Worried About Running Out of Food in the Last Year: Never true    Ran Out  of Food in the Last Year: Never true  Transportation Needs: No Transportation Needs (12/09/2021)   PRAPARE - Hydrologist (Medical): No    Lack of Transportation (Non-Medical): No  Physical Activity: Inactive (12/09/2021)   Exercise Vital Sign    Days of Exercise per Week: 0 days    Minutes of Exercise per Session: 0 min  Stress: No Stress Concern Present (12/09/2021)   [place]    Feeling of Stress : Not at all  Social Connections: Moderately Isolated (12/09/2021)   Social Connection and Isolation Panel [NHANES]    Frequency of Communication with Friends and Family: More than three times a week    Frequency of Social Gatherings with Friends and Family: More than three times a week    Attends Religious Services: Never    Marine scientist or Organizations: No    Attends Archivist Meetings: Never    Marital Status: Married  Human resources officer Violence: Not At Risk (12/09/2021)   Humiliation, Afraid, Rape, and Kick questionnaire    Fear of Current or Ex-Partner: No    Emotionally Abused: No    Physically Abused: No    Sexually Abused: No    Past Surgical History:  Procedure Laterality Date   ABDOMINAL HYSTERECTOMY     ANTERIOR AND POSTERIOR REPAIR N/A 07/17/2012   Procedure: ANTERIOR (CYSTOCELE) AND POSTERIOR REPAIR (RECTOCELE);  Surgeon: Princess Bruins, MD;  Location: [facility];  Service: Gynecology;  Laterality: N/A;   CATARACT EXTRACTION  04/24/2013   HYSTEROSCOPY W/ ENDOMETRIAL ABLATION     1987   MYOMECTOMY VAGINAL APPROACH     ROBOTIC ASSISTED TOTAL HYSTERECTOMY N/A 07/17/2012   Procedure: ROBOTIC ASSISTED TOTAL HYSTERECTOMY with Utero-sacral Ligament Suspension;  Surgeon: Princess Bruins, MD;  Location: [facility];  Service: Gynecology;  Laterality: N/A;    Family History  Problem Relation Age of Onset   Diabetes Mother    Hyperlipidemia Mother    Hypertension  Mother    COPD Father    Lung cancer Brother        lung   Diabetes Maternal Grandfather    Cancer Daughter    Colon cancer Neg Hx     No Known Allergies  Current Outpatient Medications on File Prior to Visit  Medication Sig Dispense Refill   ibuprofen (ADVIL,MOTRIN) 200 MG tablet Take 400 mg by mouth every 6 (six) hours as needed for moderate pain.     JANUMET XR [phone] MG TB24 TAKE ONE TABLET BY MOUTH DAILY 90 tablet 1   lisinopril-hydrochlorothiazide (ZESTORETIC) 10-12.5 MG tablet TAKE 1/2 TABLET BY MOUTH DAILY 45 tablet 3   Multiple Vitamins-Minerals (WOMENS MULTI VITAMIN & MINERAL PO) Take 1 tablet by mouth 2 (two) times daily.     simvastatin (ZOCOR) 5 MG tablet TAKE ONE TABLET BY MOUTH DAILY 90 tablet 1   TURMERIC PO Take 3 tablets  by mouth. Takes 2 in the am & 1 in the afternoon     No current facility-administered medications on file prior to visit.    BP 110/80   Pulse 90   Temp 97.8 F (36.6 C) (Oral)   Ht '5\' 2"'$  (1.575 m)   Wt 199 lb (90.3 kg)   SpO2 98%   BMI 36.40 kg/m       Objective:   Physical Exam Vitals and nursing note reviewed.  Constitutional:      General: She is not in acute distress.    Appearance: Normal appearance. She is well-developed and overweight. She is not ill-appearing.  HENT:     Head: Normocephalic and atraumatic.     Right Ear: Tympanic membrane, ear canal and external ear normal. There is no impacted cerumen.     Left Ear: Tympanic membrane, ear canal and external ear normal. There is no impacted cerumen.     Nose: Nose normal. No congestion or rhinorrhea.     Mouth/Throat:     Mouth: Mucous membranes are moist.     Pharynx: Oropharynx is clear. No oropharyngeal exudate or posterior oropharyngeal erythema.  Eyes:     General:        Right eye: No discharge.        Left eye: No discharge.     Extraocular Movements: Extraocular movements intact.     Conjunctiva/sclera: Conjunctivae normal.     Pupils: Pupils are equal,  round, and reactive to light.  Neck:     Thyroid: No thyromegaly.     Vascular: No carotid bruit.     Trachea: No tracheal deviation.  Cardiovascular:     Rate and Rhythm: Normal rate and regular rhythm.     Pulses: Normal pulses.     Heart sounds: Normal heart sounds. No murmur heard.    No friction rub. No gallop.  Pulmonary:     Effort: Pulmonary effort is normal. No respiratory distress.     Breath sounds: Normal breath sounds. No stridor. No wheezing, rhonchi or rales.  Chest:     Chest wall: No tenderness.  Abdominal:     General: Abdomen is flat. Bowel sounds are normal. There is no distension.     Palpations: Abdomen is soft. There is no mass.     Tenderness: There is no abdominal tenderness. There is no right CVA tenderness, left CVA tenderness, guarding or rebound.     Hernia: No hernia is present.  Musculoskeletal:        General: No swelling, tenderness, deformity or signs of injury. Normal range of motion.     Cervical back: Normal range of motion and neck supple.     Right lower leg: No edema.     Left lower leg: No edema.  Lymphadenopathy:     Cervical: No cervical adenopathy.  Skin:    General: Skin is warm and dry.     Coloration: Skin is not jaundiced or pale.     Findings: No bruising, erythema, lesion or rash.  Neurological:     General: No focal deficit present.     Mental Status: She is alert and oriented to person, place, and time.     Cranial Nerves: No cranial nerve deficit.     Sensory: No sensory deficit.     Motor: No weakness.     Coordination: Coordination normal.     Gait: Gait normal.     Deep Tendon Reflexes: Reflexes normal.  Psychiatric:  Mood and Affect: Mood normal.        Behavior: Behavior normal.        Thought Content: Thought content normal.        Judgment: Judgment normal.       Assessment & Plan:  1. Routine general medical examination at a health care facility - encouraged to get back into walking and eating healthy   - Follow up in one year or sooner if needed - CBC with Differential/Platelet; Future - Comprehensive metabolic panel; Future - Hemoglobin A1c; Future - Lipid panel; Future - TSH; Future  2. Controlled type 2 diabetes mellitus with complication, without long-term current use of insulin (HCC) - Controlled. Likely no change in medication  - CBC with Differential/Platelet; Future - Comprehensive metabolic panel; Future - Hemoglobin A1c; Future - Lipid panel; Future - TSH; Future - Microalbumin/Creatinine Ratio, Urine; Future  3. Essential hypertension - Well controlled.  - CBC with Differential/Platelet; Future - Comprehensive metabolic panel; Future - Hemoglobin A1c; Future - Lipid panel; Future - TSH; Future - Microalbumin/Creatinine Ratio, Urine; Future  4. Mixed hyperlipidemia - Consider increase in statin  - CBC with Differential/Platelet; Future - Comprehensive metabolic panel; Future - Hemoglobin A1c; Future - Lipid panel; Future - TSH; Future  5. Need for immunization against influenza  - Flu Vaccine QUAD High Dose(Fluad)  Dorothyann Peng, NP

## 2022-06-12 DIAGNOSIS — M25531 Pain in right wrist: Secondary | ICD-10-CM | POA: Diagnosis not present

## 2022-06-20 DIAGNOSIS — M25531 Pain in right wrist: Secondary | ICD-10-CM | POA: Diagnosis not present

## 2022-07-22 ENCOUNTER — Other Ambulatory Visit: Payer: Self-pay | Admitting: Adult Health

## 2022-07-22 DIAGNOSIS — E782 Mixed hyperlipidemia: Secondary | ICD-10-CM

## 2022-08-13 ENCOUNTER — Other Ambulatory Visit: Payer: Self-pay | Admitting: Adult Health

## 2022-08-13 DIAGNOSIS — E118 Type 2 diabetes mellitus with unspecified complications: Secondary | ICD-10-CM

## 2022-09-05 ENCOUNTER — Ambulatory Visit (INDEPENDENT_AMBULATORY_CARE_PROVIDER_SITE_OTHER): Payer: PPO | Admitting: Adult Health

## 2022-09-05 ENCOUNTER — Encounter: Payer: Self-pay | Admitting: Adult Health

## 2022-09-05 VITALS — BP 118/86 | HR 71 | Temp 98.2°F | Ht 62.0 in | Wt 199.8 lb

## 2022-09-05 DIAGNOSIS — Z7984 Long term (current) use of oral hypoglycemic drugs: Secondary | ICD-10-CM

## 2022-09-05 DIAGNOSIS — E118 Type 2 diabetes mellitus with unspecified complications: Secondary | ICD-10-CM

## 2022-09-05 DIAGNOSIS — I10 Essential (primary) hypertension: Secondary | ICD-10-CM | POA: Diagnosis not present

## 2022-09-05 LAB — POCT GLYCOSYLATED HEMOGLOBIN (HGB A1C): Hemoglobin A1C: 5.8 % — AB (ref 4.0–5.6)

## 2022-09-05 NOTE — Patient Instructions (Signed)
Health Maintenance Due  Topic Date Due   COVID-19 Vaccine (5 - 2023-24 season) 12/23/2021   HEMOGLOBIN A1C  09/05/2022      Row Labels 03/07/2022    7:08 AM 12/09/2021   11:43 AM 03/03/2021    7:26 AM  Depression screen PHQ 2/9   Section Header. No data exists in this row.     Decreased Interest   0 0 0  Down, Depressed, Hopeless   0 0 0  PHQ - 2 Score   0 0 0  Altered sleeping   0    Tired, decreased energy   0    Change in appetite   0    Feeling bad or failure about yourself    0    Trouble concentrating   0    Moving slowly or fidgety/restless   0    Suicidal thoughts   0    PHQ-9 Score   0    Difficult doing work/chores   Not difficult at all

## 2022-09-05 NOTE — Progress Notes (Signed)
Subjective:    Patient ID: Arnoldo Morale, female    DOB: 01-Apr-1956, 67 y.o.   MRN: [id number]  HPI 67 year old female who  has a past medical history of DIABETES MELLITUS, TYPE II (11/23/2006), HYPERLIPIDEMIA, BORDERLINE (04/08/2008), HYPERTENSION (11/23/2006), Lyme disease, and WEIGHT GAIN (10/12/2008).  DM type 2 -managed with Janumet extended release [phone] mg daily. She does monitor her blood sugars at home periodically with readings in the 90s to 110s.  She denies any hypoglycemic episodes.  She is not walking much as she is taking care of her husband who had a knee replacement surgery.  Lab Results  Component Value Date   HGBA1C 5.8 (A) 09/05/2022   Hypertension-managed with Zestoretic 10-12.5 mg daily, she takes a half a tab.  Denies dizziness, lightheadedness, chest pain, shortness of breath, or syncopal episodes BP Readings from Last 3 Encounters:  09/05/22 118/86  03/07/22 110/80  09/08/21 108/70    Review of Systems See HPI   Past Medical History:  Diagnosis Date   DIABETES MELLITUS, TYPE II 11/23/2006   HYPERLIPIDEMIA, BORDERLINE 04/08/2008   HYPERTENSION 11/23/2006   Lyme disease    WEIGHT GAIN 10/12/2008    Social History   Socioeconomic History   Marital status: Married    Spouse name: Not on file   Number of children: Not on file   Years of education: Not on file   Highest education level: Not on file  Occupational History   Not on file  Tobacco Use   Smoking status: Never   Smokeless tobacco: Never  Vaping Use   Vaping Use: Never used  Substance and Sexual Activity   Alcohol use: No    Alcohol/week: 0.0 standard drinks of alcohol   Drug use: No   Sexual activity: Yes    Partners: Male    Birth control/protection: Surgical    Comment: 1st intercourse- 20, less than 5, married- 46 yrs, hysterectomy  Other Topics Concern   Not on file  Social History Narrative   Works for [employer] as a Engineer, materials for 23 years    Married for 39 years   3  kids and 3 grandchildren. All live locally    Social Determinants of Health   Financial Resource Strain: Low Risk  (12/09/2021)   Overall Financial Resource Strain (CARDIA)    Difficulty of Paying Living Expenses: Not hard at all  Food Insecurity: No Food Insecurity (12/09/2021)   Hunger Vital Sign    Worried About Running Out of Food in the Last Year: Never true    Ran Out of Food in the Last Year: Never true  Transportation Needs: No Transportation Needs (12/09/2021)   PRAPARE - Administrator, Civil Service (Medical): No    Lack of Transportation (Non-Medical): No  Physical Activity: Inactive (12/09/2021)   Exercise Vital Sign    Days of Exercise per Week: 0 days    Minutes of Exercise per Session: 0 min  Stress: No Stress Concern Present (12/09/2021)   [employer] of Occupational Health - Occupational Stress Questionnaire    Feeling of Stress : Not at all  Social Connections: Moderately Isolated (12/09/2021)   Social Connection and Isolation Panel [NHANES]    Frequency of Communication with Friends and Family: More than three times a week    Frequency of Social Gatherings with Friends and Family: More than three times a week    Attends Religious Services: Never    Database administrator or Organizations: No  Attends Banker Meetings: Never    Marital Status: Married  Catering manager Violence: Not At Risk (12/09/2021)   Humiliation, Afraid, Rape, and Kick questionnaire    Fear of Current or Ex-Partner: No    Emotionally Abused: No    Physically Abused: No    Sexually Abused: No    Past Surgical History:  Procedure Laterality Date   ABDOMINAL HYSTERECTOMY     ANTERIOR AND POSTERIOR REPAIR N/A 07/17/2012   Procedure: ANTERIOR (CYSTOCELE) AND POSTERIOR REPAIR (RECTOCELE);  Surgeon: Genia Del, MD;  Location: [facility];  Service: Gynecology;  Laterality: N/A;   CATARACT EXTRACTION  04/24/2013   HYSTEROSCOPY W/ ENDOMETRIAL ABLATION     1987    MYOMECTOMY VAGINAL APPROACH     ROBOTIC ASSISTED TOTAL HYSTERECTOMY N/A 07/17/2012   Procedure: ROBOTIC ASSISTED TOTAL HYSTERECTOMY with Utero-sacral Ligament Suspension;  Surgeon: Genia Del, MD;  Location: [facility];  Service: Gynecology;  Laterality: N/A;    Family History  Problem Relation Age of Onset   Diabetes Mother    Hyperlipidemia Mother    Hypertension Mother    COPD Father    Lung cancer Brother        lung   Diabetes Maternal Grandfather    Cancer Daughter    Colon cancer Neg Hx     No Known Allergies  Current Outpatient Medications on File Prior to Visit  Medication Sig Dispense Refill   ibuprofen (ADVIL,MOTRIN) 200 MG tablet Take 400 mg by mouth every 6 (six) hours as needed for moderate pain.     JANUMET XR [phone] MG TB24 TAKE 1 TABLET BY MOUTH DAILY 90 tablet 1   lisinopril-hydrochlorothiazide (ZESTORETIC) 10-12.5 MG tablet TAKE 1/2 TABLET BY MOUTH DAILY 45 tablet 3   Multiple Vitamins-Minerals (WOMENS MULTI VITAMIN & MINERAL PO) Take 1 tablet by mouth 2 (two) times daily.     simvastatin (ZOCOR) 5 MG tablet TAKE 1 TABLET BY MOUTH DAILY 90 tablet 1   TURMERIC PO Take 3 tablets by mouth. Takes 2 in the am & 1 in the afternoon     No current facility-administered medications on file prior to visit.    BP 118/86   Pulse 71   Temp 98.2 F (36.8 C) (Oral)   Ht 5\' 2"  (1.575 m)   Wt 199 lb 12.8 oz (90.6 kg)   SpO2 97%   BMI 36.54 kg/m       Objective:   Physical Exam Vitals and nursing note reviewed.  Constitutional:      Appearance: Normal appearance. She is obese.  Cardiovascular:     Rate and Rhythm: Normal rate and regular rhythm.     Pulses: Normal pulses.     Heart sounds: Normal heart sounds.  Pulmonary:     Effort: Pulmonary effort is normal.     Breath sounds: Normal breath sounds.  Skin:    General: Skin is warm and dry.  Neurological:     General: No focal deficit present.     Mental Status: She is alert and oriented to person,  place, and time.  Psychiatric:        Mood and Affect: Mood normal.        Behavior: Behavior normal.        Thought Content: Thought content normal.        Judgment: Judgment normal.       Assessment & Plan:  1. Controlled type 2 diabetes mellitus with complication, without long-term current use of insulin (HCC)  -  POC HgB A1c- 5.8  - Start exercising and eating healthy  - No change in medication   2. Essential hypertension - well controlled.  - No change in medication   Shirline Frees, NP

## 2022-10-05 DIAGNOSIS — H43812 Vitreous degeneration, left eye: Secondary | ICD-10-CM | POA: Diagnosis not present

## 2022-10-05 DIAGNOSIS — H52223 Regular astigmatism, bilateral: Secondary | ICD-10-CM | POA: Diagnosis not present

## 2022-10-05 DIAGNOSIS — E119 Type 2 diabetes mellitus without complications: Secondary | ICD-10-CM | POA: Diagnosis not present

## 2022-10-05 DIAGNOSIS — H59812 Chorioretinal scars after surgery for detachment, left eye: Secondary | ICD-10-CM | POA: Diagnosis not present

## 2022-10-05 DIAGNOSIS — H524 Presbyopia: Secondary | ICD-10-CM | POA: Diagnosis not present

## 2022-10-05 DIAGNOSIS — H5203 Hypermetropia, bilateral: Secondary | ICD-10-CM | POA: Diagnosis not present

## 2022-10-05 DIAGNOSIS — H35372 Puckering of macula, left eye: Secondary | ICD-10-CM | POA: Diagnosis not present

## 2022-10-05 LAB — HM DIABETES EYE EXAM

## 2022-10-17 ENCOUNTER — Encounter: Payer: Self-pay | Admitting: Adult Health

## 2022-11-22 ENCOUNTER — Encounter (INDEPENDENT_AMBULATORY_CARE_PROVIDER_SITE_OTHER): Payer: Self-pay

## 2022-12-12 ENCOUNTER — Other Ambulatory Visit: Payer: Self-pay | Admitting: Adult Health

## 2022-12-12 DIAGNOSIS — I10 Essential (primary) hypertension: Secondary | ICD-10-CM

## 2022-12-13 ENCOUNTER — Ambulatory Visit (INDEPENDENT_AMBULATORY_CARE_PROVIDER_SITE_OTHER): Payer: PPO

## 2022-12-13 VITALS — Ht 65.0 in | Wt 199.0 lb

## 2022-12-13 DIAGNOSIS — Z0000 Encounter for general adult medical examination without abnormal findings: Secondary | ICD-10-CM | POA: Diagnosis not present

## 2022-12-13 NOTE — Progress Notes (Signed)
Subjective:   Amanda Eaton is a 67 y.o. female who presents for Medicare Annual (Subsequent) preventive examination.  Visit Complete: Virtual  I connected with  Amanda Eaton on 12/13/22 by a audio enabled telemedicine application and verified that I am speaking with the correct person using two identifiers.  Patient Location: Home  Provider Location: Home Office  I discussed the limitations of evaluation and management by telemedicine. The patient expressed understanding and agreed to proceed.  Patient Medicare AWV questionnaire was completed by the patient on 12/12/22; I have confirmed that all information answered by patient is correct and no changes since this date.  Review of Systems    Vital Signs: Unable to obtain new vitals due to this being a telehealth visit.  Cardiac Risk Factors include: diabetes mellitus;advanced age (>50men, >76 women);hypertension     Objective:    Today's Vitals   12/13/22 1411  Weight: 199 lb (90.3 kg)  Height: 5\' 5"  (1.651 m)   Body mass index is 33.12 kg/m.     12/13/2022    2:19 PM 12/09/2021   11:47 AM 12/08/2020    8:24 AM 10/16/2020   10:57 AM 03/15/2016    8:45 AM 06/14/2015    8:05 AM 11/30/2014   12:43 PM  Advanced Directives  Does Patient Have a Medical Advance Directive? No No No No No No No  Would patient like information on creating a medical advance directive? No - Patient declined No - Patient declined No - Patient declined        Current Medications (verified) Outpatient Encounter Medications as of 12/13/2022  Medication Sig   ibuprofen (ADVIL,MOTRIN) 200 MG tablet Take 400 mg by mouth every 6 (six) hours as needed for moderate pain.   JANUMET XR [phone] MG TB24 TAKE 1 TABLET BY MOUTH DAILY   lisinopril-hydrochlorothiazide (ZESTORETIC) 10-12.5 MG tablet TAKE 1/2 TABLET BY MOUTH DAILY   Multiple Vitamins-Minerals (WOMENS MULTI VITAMIN & MINERAL PO) Take 1 tablet by mouth 2 (two) times daily.   simvastatin (ZOCOR) 5 MG  tablet TAKE 1 TABLET BY MOUTH DAILY   TURMERIC PO Take 3 tablets by mouth. Takes 2 in the am & 1 in the afternoon   No facility-administered encounter medications on file as of 12/13/2022.    Allergies (verified) Patient has no known allergies.   History: Past Medical History:  Diagnosis Date   DIABETES MELLITUS, TYPE II 11/23/2006   HYPERLIPIDEMIA, BORDERLINE 04/08/2008   HYPERTENSION 11/23/2006   Lyme disease    WEIGHT GAIN 10/12/2008   Past Surgical History:  Procedure Laterality Date   ABDOMINAL HYSTERECTOMY     ANTERIOR AND POSTERIOR REPAIR N/A 07/17/2012   Procedure: ANTERIOR (CYSTOCELE) AND POSTERIOR REPAIR (RECTOCELE);  Surgeon: Genia Del, MD;  Location: [facility];  Service: Gynecology;  Laterality: N/A;   CATARACT EXTRACTION  04/24/2013   HYSTEROSCOPY W/ ENDOMETRIAL ABLATION     1987   MYOMECTOMY VAGINAL APPROACH     ROBOTIC ASSISTED TOTAL HYSTERECTOMY N/A 07/17/2012   Procedure: ROBOTIC ASSISTED TOTAL HYSTERECTOMY with Utero-sacral Ligament Suspension;  Surgeon: Genia Del, MD;  Location: [facility];  Service: Gynecology;  Laterality: N/A;   Family History  Problem Relation Age of Onset   Diabetes Mother    Hyperlipidemia Mother    Hypertension Mother    COPD Father    Lung cancer Brother        lung   Diabetes Maternal Grandfather    Cancer Daughter    Colon cancer Neg Hx  Social History   Socioeconomic History   Marital status: Married    Spouse name: Not on file   Number of children: Not on file   Years of education: Not on file   Highest education level: Not on file  Occupational History   Not on file  Tobacco Use   Smoking status: Never   Smokeless tobacco: Never  Vaping Use   Vaping status: Never Used  Substance and Sexual Activity   Alcohol use: No    Alcohol/week: 0.0 standard drinks of alcohol   Drug use: No   Sexual activity: Yes    Partners: Male    Birth control/protection: Surgical    Comment: 1st intercourse- 20, less  than 5, married- 46 yrs, hysterectomy  Other Topics Concern   Not on file  Social History Narrative   Works for [employer] as a Engineer, materials for 23 years    Married for 39 years   3 kids and 3 grandchildren. All live locally    Social Determinants of Health   Financial Resource Strain: Low Risk  (12/12/2022)   Overall Financial Resource Strain (CARDIA)    Difficulty of Paying Living Expenses: Not hard at all  Food Insecurity: No Food Insecurity (12/12/2022)   Hunger Vital Sign    Worried About Running Out of Food in the Last Year: Never true    Ran Out of Food in the Last Year: Never true  Transportation Needs: No Transportation Needs (12/12/2022)   PRAPARE - Administrator, Civil Service (Medical): No    Lack of Transportation (Non-Medical): No  Physical Activity: Insufficiently Active (12/12/2022)   Exercise Vital Sign    Days of Exercise per Week: 1 day    Minutes of Exercise per Session: 30 min  Stress: No Stress Concern Present (12/12/2022)   [employer] of Occupational Health - Occupational Stress Questionnaire    Feeling of Stress : Not at all  Social Connections: Unknown (12/12/2022)   Social Connection and Isolation Panel [NHANES]    Frequency of Communication with Friends and Family: Three times a week    Frequency of Social Gatherings with Friends and Family: Once a week    Attends Religious Services: Not on Marketing executive or Organizations: No    Attends Banker Meetings: Never    Marital Status: Married    Tobacco Counseling Counseling given: Not Answered   Clinical Intake:  Pre-visit preparation completed: Yes  Pain : No/denies pain     BMI - recorded: 33.12 Nutritional Status: BMI > 30  Obese Nutritional Risks: None Diabetes: Yes CBG done?: Yes (CBG 112 Per patient) CBG resulted in Enter/ Edit results?: Yes Did pt. bring in CBG monitor from home?: No  How often do you need to have someone help you when you  read instructions, pamphlets, or other written materials from your doctor or pharmacy?: 1 - Never  Interpreter Needed?: No  Information entered by :: Theresa Mulligan LPN   Activities of Daily Living    12/12/2022   11:27 AM 03/07/2022    7:09 AM  In your present state of health, do you have any difficulty performing the following activities:  Hearing? 0 0  Vision? 0 0  Difficulty concentrating or making decisions? 0 0  Walking or climbing stairs? 0 0  Dressing or bathing? 0 0  Doing errands, shopping? 0 0  Preparing Food and eating ? N   Using the Toilet? N  In the past six months, have you accidently leaked urine? N   Do you have problems with loss of bowel control? N   Managing your Medications? N   Managing your Finances? N   Housekeeping or managing your Housekeeping? N     Patient Care Team: Shirline Frees, NP as PCP - General (Family Medicine) Genia Del, MD as Consulting Physician (Obstetrics and Gynecology)  Indicate any recent Medical Services you may have received from other than [facility] providers in the past year (date may be approximate).     Assessment:   This is a routine wellness examination for Amanda Eaton.  Hearing/Vision screen Hearing Screening - Comments:: Denies hearing difficulties   Vision Screening - Comments:: Wears rx glasses - up to date with routine eye exams with  Dr Lucretia Roers  Dietary issues and exercise activities discussed:     Goals Addressed               This Visit's Progress     Lose weight (pt-stated)        Start back walking and lose weight.       Depression Screen    12/13/2022    2:15 PM 09/05/2022    7:09 AM 03/07/2022    7:08 AM 12/09/2021   11:43 AM 03/03/2021    7:26 AM 12/08/2020    8:27 AM 12/08/2020    8:23 AM  PHQ 2/9 Scores  PHQ - 2 Score 0 0 0 0 0 0 0  PHQ- 9 Score  1 0        Fall Risk    12/12/2022   11:27 AM 09/05/2022    7:09 AM 03/07/2022    7:08 AM 12/09/2021   11:46 AM 12/08/2020    8:24 AM   Fall Risk   Falls in the past year? 0 0 0 0 0  Number falls in past yr:  0 0 0 0  Injury with Fall? 0 0 0 0 0  Risk for fall due to :  No Fall Risks No Fall Risks No Fall Risks   Follow up  Falls evaluation completed Falls evaluation completed  Falls evaluation completed    MEDICARE RISK AT HOME: Medicare Risk at Home Any stairs in or around the home?: Yes If so, are there any without handrails?: No Home free of loose throw rugs in walkways, pet beds, electrical cords, etc?: Yes Adequate lighting in your home to reduce risk of falls?: Yes Life alert?: No Use of a cane, walker or w/c?: No Grab bars in the bathroom?: No Shower chair or bench in shower?: No Elevated toilet seat or a handicapped toilet?: No  TIMED UP AND GO:  Was the test performed?  No    Cognitive Function:        12/13/2022    2:19 PM 12/09/2021   11:48 AM  6CIT Screen  What Year? 0 points 0 points  What month? 0 points 0 points  What time? 0 points 0 points  Count back from 20 0 points 0 points  Months in reverse 0 points 0 points  Repeat phrase 0 points 0 points  Total Score 0 points 0 points    Immunizations Immunization History  Administered Date(s) Administered   Fluad Quad(high Dose 65+) 01/23/2020, 03/07/2022   Influenza Split 05/01/2012   Influenza Whole 04/03/2007, 01/14/2009, 02/28/2010   Influenza, Quadrivalent, Recombinant, Inj, Pf 01/14/2019   Influenza, Seasonal, Injecte, Preservative Fre 12/14/2014   Influenza,inj,Quad PF,6+ Mos 03/03/2013, 03/02/2014, 02/15/2016, 03/06/2017, 02/05/2018,  03/03/2021   Moderna Covid Bivalent Peds Booster(80mo Thru 45yrs) 03/24/2021   Moderna Sars-Covid-2 Vaccination 07/24/2019, 08/16/2019, 02/20/2020   Pneumococcal Conjugate-13 12/14/2014   Pneumococcal Polysaccharide-23 07/18/2012, 06/22/2020   Td 01/03/2007   Tdap 03/06/2017   Zoster Recombinant(Shingrix) 03/10/2021, 07/07/2021    TDAP status: Up to date  Flu Vaccine status: Due, Education has  been provided regarding the importance of this vaccine. Advised may receive this vaccine at local pharmacy or Health Dept. Aware to provide a copy of the vaccination record if obtained from local pharmacy or Health Dept. Verbalized acceptance and understanding.  Pneumococcal vaccine status: Up to date  Covid-19 vaccine status: Declined, Education has been provided regarding the importance of this vaccine but patient still declined. Advised may receive this vaccine at local pharmacy or Health Dept.or vaccine clinic. Aware to provide a copy of the vaccination record if obtained from local pharmacy or Health Dept. Verbalized acceptance and understanding.  Qualifies for Shingles Vaccine? Yes   Zostavax completed Yes   Shingrix Completed?: Yes  Screening Tests Health Maintenance  Topic Date Due   COVID-19 Vaccine (5 - 2023-24 season) 12/23/2021   MAMMOGRAM  11/25/2022   INFLUENZA VACCINE  11/23/2022   Diabetic kidney evaluation - eGFR measurement  03/08/2023   Diabetic kidney evaluation - Urine ACR  03/08/2023   FOOT EXAM  03/08/2023   HEMOGLOBIN A1C  03/08/2023   OPHTHALMOLOGY EXAM  10/05/2023   Medicare Annual Wellness (AWV)  12/13/2023   Colonoscopy  06/27/2025   DTaP/Tdap/Td (3 - Td or Tdap) 03/07/2027   Pneumonia Vaccine 3+ Years old  Completed   DEXA SCAN  Completed   Hepatitis C Screening  Completed   Zoster Vaccines- Shingrix  Completed   HPV VACCINES  Aged Out    Health Maintenance  Health Maintenance Due  Topic Date Due   COVID-19 Vaccine (5 - 2023-24 season) 12/23/2021   MAMMOGRAM  11/25/2022   INFLUENZA VACCINE  11/23/2022    Colorectal cancer screening: Type of screening: Colonoscopy. Completed 06/28/15. Repeat every 10 years  Mammogram status: Ordered Patient deferred. Pt provided with contact info and advised to call to schedule appt.   Bone Density status: Completed 11/15/21. Results reflect: Bone density results: OSTEOPENIA. Repeat every   years.  Lung Cancer  Screening: (Low Dose CT Chest recommended if Age 63-80 years, 20 pack-year currently smoking OR have quit w/in 15years.) does not qualify.     Additional Screening:  Hepatitis C Screening: does qualify; Completed 04/09/18  Vision Screening: Recommended annual ophthalmology exams for early detection of glaucoma and other disorders of the eye. Is the patient up to date with their annual eye exam?  Yes  Who is the provider or what is the name of the office in which the patient attends annual eye exams? Dr Joseph Art If pt is not established with a provider, would they like to be referred to a provider to establish care? No .   Dental Screening: Recommended annual dental exams for proper oral hygiene  Diabetic Foot Exam: Diabetic Foot Exam: Completed 03/07/22  Community Resource Referral / Chronic Care Management:  CRR required this visit?  No   CCM required this visit?  No     Plan:     I have personally reviewed and noted the following in the patient's chart:   Medical and social history Use of alcohol, tobacco or illicit drugs  Current medications and supplements including opioid prescriptions. Patient is not currently taking opioid prescriptions. Functional ability and status Nutritional  status Physical activity Advanced directives List of other physicians Hospitalizations, surgeries, and ER visits in previous 12 months Vitals Screenings to include cognitive, depression, and falls Referrals and appointments  In addition, I have reviewed and discussed with patient certain preventive protocols, quality metrics, and best practice recommendations. A written personalized care plan for preventive services as well as general preventive health recommendations were provided to patient.     Tillie Rung, LPN   12/21/5619   After Visit Summary: (MyChart) Due to this being a telephonic visit, the after visit summary with patients personalized plan was offered to patient via MyChart    Nurse Notes: None

## 2022-12-13 NOTE — Patient Instructions (Addendum)
Ms. Berends , Thank you for taking time to come for your Medicare Wellness Visit. I appreciate your ongoing commitment to your health goals. Please review the following plan we discussed and let me know if I can assist you in the future.   Referrals/Orders/Follow-Ups/Clinician Recommendations:    This is a list of the screening recommended for you and due dates:  Health Maintenance  Topic Date Due   COVID-19 Vaccine (5 - 2023-24 season) 12/23/2021   Mammogram  11/25/2022   Flu Shot  11/23/2022   Yearly kidney function blood test for diabetes  03/08/2023   Yearly kidney health urinalysis for diabetes  03/08/2023   Complete foot exam   03/08/2023   Hemoglobin A1C  03/08/2023   Eye exam for diabetics  10/05/2023   Medicare Annual Wellness Visit  12/13/2023   Colon Cancer Screening  06/27/2025   DTaP/Tdap/Td vaccine (3 - Td or Tdap) 03/07/2027   Pneumonia Vaccine  Completed   DEXA scan (bone density measurement)  Completed   Hepatitis C Screening  Completed   Zoster (Shingles) Vaccine  Completed   HPV Vaccine  Aged Out    Advanced directives: (Declined) Advance directive discussed with you today. Even though you declined this today, please call our office should you change your mind, and we can give you the proper paperwork for you to fill out.  Next Medicare Annual Wellness Visit scheduled for next year: Yes

## 2022-12-19 DIAGNOSIS — Z1231 Encounter for screening mammogram for malignant neoplasm of breast: Secondary | ICD-10-CM | POA: Diagnosis not present

## 2022-12-19 LAB — HM MAMMOGRAPHY

## 2023-01-20 ENCOUNTER — Other Ambulatory Visit: Payer: Self-pay | Admitting: Adult Health

## 2023-01-20 DIAGNOSIS — E782 Mixed hyperlipidemia: Secondary | ICD-10-CM

## 2023-02-18 ENCOUNTER — Other Ambulatory Visit: Payer: Self-pay | Admitting: Adult Health

## 2023-02-18 DIAGNOSIS — E118 Type 2 diabetes mellitus with unspecified complications: Secondary | ICD-10-CM

## 2023-03-13 ENCOUNTER — Telehealth: Payer: Self-pay

## 2023-03-13 ENCOUNTER — Other Ambulatory Visit: Payer: Self-pay | Admitting: Adult Health

## 2023-03-13 ENCOUNTER — Ambulatory Visit (INDEPENDENT_AMBULATORY_CARE_PROVIDER_SITE_OTHER): Payer: PPO | Admitting: Adult Health

## 2023-03-13 ENCOUNTER — Other Ambulatory Visit (HOSPITAL_COMMUNITY): Payer: Self-pay

## 2023-03-13 ENCOUNTER — Encounter: Payer: Self-pay | Admitting: Adult Health

## 2023-03-13 ENCOUNTER — Telehealth: Payer: Self-pay | Admitting: Adult Health

## 2023-03-13 VITALS — BP 128/88 | HR 83 | Temp 98.0°F | Ht 65.75 in | Wt 204.0 lb

## 2023-03-13 DIAGNOSIS — I10 Essential (primary) hypertension: Secondary | ICD-10-CM

## 2023-03-13 DIAGNOSIS — E119 Type 2 diabetes mellitus without complications: Secondary | ICD-10-CM

## 2023-03-13 DIAGNOSIS — Z23 Encounter for immunization: Secondary | ICD-10-CM | POA: Diagnosis not present

## 2023-03-13 DIAGNOSIS — E782 Mixed hyperlipidemia: Secondary | ICD-10-CM | POA: Diagnosis not present

## 2023-03-13 DIAGNOSIS — Z7984 Long term (current) use of oral hypoglycemic drugs: Secondary | ICD-10-CM | POA: Diagnosis not present

## 2023-03-13 DIAGNOSIS — E66811 Obesity, class 1: Secondary | ICD-10-CM

## 2023-03-13 DIAGNOSIS — Z0000 Encounter for general adult medical examination without abnormal findings: Secondary | ICD-10-CM

## 2023-03-13 LAB — CBC WITH DIFFERENTIAL/PLATELET
Basophils Absolute: 0 10*3/uL (ref 0.0–0.1)
Basophils Relative: 0.5 % (ref 0.0–3.0)
Eosinophils Absolute: 0.1 10*3/uL (ref 0.0–0.7)
Eosinophils Relative: 2.2 % (ref 0.0–5.0)
HCT: 39.2 % (ref 36.0–46.0)
Hemoglobin: 12.7 g/dL (ref 12.0–15.0)
Lymphocytes Relative: 32.7 % (ref 12.0–46.0)
Lymphs Abs: 1.4 10*3/uL (ref 0.7–4.0)
MCHC: 32.5 g/dL (ref 30.0–36.0)
MCV: 89.1 fL (ref 78.0–100.0)
Monocytes Absolute: 0.3 10*3/uL (ref 0.1–1.0)
Monocytes Relative: 8 % (ref 3.0–12.0)
Neutro Abs: 2.3 10*3/uL (ref 1.4–7.7)
Neutrophils Relative %: 56.6 % (ref 43.0–77.0)
Platelets: 209 10*3/uL (ref 150.0–400.0)
RBC: 4.41 Mil/uL (ref 3.87–5.11)
RDW: 13.6 % (ref 11.5–15.5)
WBC: 4.1 10*3/uL (ref 4.0–10.5)

## 2023-03-13 LAB — COMPREHENSIVE METABOLIC PANEL
ALT: 14 U/L (ref 0–35)
AST: 17 U/L (ref 0–37)
Albumin: 4.1 g/dL (ref 3.5–5.2)
Alkaline Phosphatase: 57 U/L (ref 39–117)
BUN: 21 mg/dL (ref 6–23)
CO2: 30 meq/L (ref 19–32)
Calcium: 9.3 mg/dL (ref 8.4–10.5)
Chloride: 104 meq/L (ref 96–112)
Creatinine, Ser: 0.76 mg/dL (ref 0.40–1.20)
GFR: 80.41 mL/min (ref >60.00)
Glucose, Bld: 108 mg/dL — ABNORMAL HIGH (ref 70–99)
Potassium: 4 meq/L (ref 3.5–5.1)
Sodium: 140 meq/L (ref 135–145)
Total Bilirubin: 0.7 mg/dL (ref 0.2–1.2)
Total Protein: 6.4 g/dL (ref 6.0–8.3)

## 2023-03-13 LAB — LIPID PANEL
Cholesterol: 171 mg/dL (ref 0–200)
HDL: 58.1 mg/dL (ref >39.00)
LDL Cholesterol: 99 mg/dL (ref 0–99)
NonHDL: 112.47
Total CHOL/HDL Ratio: 3
Triglycerides: 66 mg/dL (ref 0.0–149.0)
VLDL: 13.2 mg/dL (ref 0.0–40.0)

## 2023-03-13 LAB — MICROALBUMIN / CREATININE URINE RATIO
Creatinine,U: 110.4 mg/dL
Microalb Creat Ratio: 0.8 mg/g (ref 0.0–30.0)
Microalb, Ur: 0.9 mg/dL (ref 0.0–1.9)

## 2023-03-13 LAB — HEMOGLOBIN A1C: Hgb A1c MFr Bld: 6.5 % (ref 4.6–6.5)

## 2023-03-13 LAB — TSH: TSH: 1.06 u[IU]/mL (ref 0.35–5.50)

## 2023-03-13 MED ORDER — TIRZEPATIDE 2.5 MG/0.5ML ~~LOC~~ SOAJ: 2.5000 mg | SUBCUTANEOUS | 0 refills | Status: DC

## 2023-03-13 NOTE — Telephone Encounter (Signed)
Patient notified of update  and verbalized understanding. 

## 2023-03-13 NOTE — Telephone Encounter (Signed)
Spoke to patient and informed her of her labs. She is interested in trying [facility] to see if it helps regulate her A1c and helps with weight loss.   Will start her on 2.5 mg weekly and have her follow up in 1 month

## 2023-03-13 NOTE — Progress Notes (Signed)
Subjective:    Patient ID: Amanda Eaton, female    DOB: 02/23/1956, 67 y.o.   MRN: [id number]  HPI Patient presents for yearly preventative medicine examination. She is a pleasant 67 year old female who  has a past medical history of DIABETES MELLITUS, TYPE II (11/23/2006), HYPERLIPIDEMIA, BORDERLINE (04/08/2008), HYPERTENSION (11/23/2006), Lyme disease, and WEIGHT GAIN (10/12/2008).  DM type 2 -managed with Janumet extended release [phone] mg daily. She does monitor her blood sugars at home periodically with readings in the 115 range. She denies any hypoglycemic episodes.  Lab Results  Component Value Date   HGBA1C 5.8 (A) 09/05/2022   Hypertension-managed with Zestoretic 10-12.5 mg daily, she takes a half a tab.  Denies dizziness, lightheadedness, chest pain, shortness of breath, or syncopal episodes BP Readings from Last 3 Encounters:  03/13/23 128/88  09/05/22 118/86  03/07/22 110/80   Hyperlipidemia-managed with simvastatin 5 mg daily.  She denies myalgia or fatigue Lab Results  Component Value Date   CHOL 168 03/07/2022   HDL 66.60 03/07/2022   LDLCALC 88 03/07/2022   LDLDIRECT 158.8 10/04/2010   TRIG 66.0 03/07/2022   CHOLHDL 3 03/07/2022    All immunizations and health maintenance protocols were reviewed with the patient and needed orders were placed. She will get her flu shot today   Appropriate screening laboratory values were ordered for the patient including screening of hyperlipidemia, renal function and hepatic function.   Medication reconciliation,  past medical history, social history, problem list and allergies were reviewed in detail with the patient  Goals were established with regard to weight loss, exercise, and  diet in compliance with medications. About a month ago she started back to walking about an hour 3 x a week.  Wt Readings from Last 10 Encounters:  03/13/23 204 lb (92.5 kg)  12/13/22 199 lb (90.3 kg)  09/05/22 199 lb 12.8 oz (90.6 kg)   03/07/22 199 lb (90.3 kg)  12/09/21 194 lb (88 kg)  09/08/21 194 lb (88 kg)  08/31/21 196 lb (88.9 kg)  03/03/21 193 lb (87.5 kg)  12/01/20 187 lb (84.8 kg)  10/27/20 183 lb (83 kg)    She is up to date on routine colon cancer screening and mammograms   Review of Systems  Constitutional: Negative.   HENT: Negative.    Eyes: Negative.   Respiratory: Negative.    Cardiovascular: Negative.   Gastrointestinal: Negative.   Endocrine: Negative.   Genitourinary: Negative.   Musculoskeletal: Negative.   Skin: Negative.   Allergic/Immunologic: Negative.   Neurological: Negative.   Hematological: Negative.   Psychiatric/Behavioral: Negative.     Past Medical History:  Diagnosis Date   DIABETES MELLITUS, TYPE II 11/23/2006   HYPERLIPIDEMIA, BORDERLINE 04/08/2008   HYPERTENSION 11/23/2006   Lyme disease    WEIGHT GAIN 10/12/2008    Social History   Socioeconomic History   Marital status: Married    Spouse name: Not on file   Number of children: Not on file   Years of education: Not on file   Highest education level: Not on file  Occupational History   Not on file  Tobacco Use   Smoking status: Never   Smokeless tobacco: Never  Vaping Use   Vaping status: Never Used  Substance and Sexual Activity   Alcohol use: No    Alcohol/week: 0.0 standard drinks of alcohol   Drug use: No   Sexual activity: Yes    Partners: Male    Birth control/protection: Surgical  Comment: 1st intercourse- 20, less than 5, married- 46 yrs, hysterectomy  Other Topics Concern   Not on file  Social History Narrative   Works for [employer] as a Engineer, materials for 23 years    Married for 39 years   3 kids and 3 grandchildren. All live locally    Social Determinants of Health   Financial Resource Strain: Low Risk  (12/12/2022)   Overall Financial Resource Strain (CARDIA)    Difficulty of Paying Living Expenses: Not hard at all  Food Insecurity: No Food Insecurity (12/12/2022)   Hunger Vital  Sign    Worried About Running Out of Food in the Last Year: Never true    Ran Out of Food in the Last Year: Never true  Transportation Needs: No Transportation Needs (12/12/2022)   PRAPARE - Administrator, Civil Service (Medical): No    Lack of Transportation (Non-Medical): No  Physical Activity: Insufficiently Active (12/12/2022)   Exercise Vital Sign    Days of Exercise per Week: 1 day    Minutes of Exercise per Session: 30 min  Stress: No Stress Concern Present (12/12/2022)   [employer] of Occupational Health - Occupational Stress Questionnaire    Feeling of Stress : Not at all  Social Connections: Unknown (12/12/2022)   Social Connection and Isolation Panel [NHANES]    Frequency of Communication with Friends and Family: Three times a week    Frequency of Social Gatherings with Friends and Family: Once a week    Attends Religious Services: Not on Marketing executive or Organizations: No    Attends Banker Meetings: Never    Marital Status: Married  Catering manager Violence: Not At Risk (12/13/2022)   Humiliation, Afraid, Rape, and Kick questionnaire    Fear of Current or Ex-Partner: No    Emotionally Abused: No    Physically Abused: No    Sexually Abused: No    Past Surgical History:  Procedure Laterality Date   ABDOMINAL HYSTERECTOMY     ANTERIOR AND POSTERIOR REPAIR N/A 07/17/2012   Procedure: ANTERIOR (CYSTOCELE) AND POSTERIOR REPAIR (RECTOCELE);  Surgeon: Genia Del, MD;  Location: [facility];  Service: Gynecology;  Laterality: N/A;   CATARACT EXTRACTION  04/24/2013   HYSTEROSCOPY W/ ENDOMETRIAL ABLATION     1987   MYOMECTOMY VAGINAL APPROACH     ROBOTIC ASSISTED TOTAL HYSTERECTOMY N/A 07/17/2012   Procedure: ROBOTIC ASSISTED TOTAL HYSTERECTOMY with Utero-sacral Ligament Suspension;  Surgeon: Genia Del, MD;  Location: [facility];  Service: Gynecology;  Laterality: N/A;    Family History  Problem Relation Age of Onset    Diabetes Mother    Hyperlipidemia Mother    Hypertension Mother    COPD Father    Lung cancer Brother        lung   Diabetes Maternal Grandfather    Cancer Daughter    Colon cancer Neg Hx     No Known Allergies  Current Outpatient Medications on File Prior to Visit  Medication Sig Dispense Refill   ibuprofen (ADVIL,MOTRIN) 200 MG tablet Take 400 mg by mouth every 6 (six) hours as needed for moderate pain.     JANUMET XR [phone] MG TB24 TAKE 1 TABLET BY MOUTH DAILY 90 tablet 1   lisinopril-hydrochlorothiazide (ZESTORETIC) 10-12.5 MG tablet TAKE 1/2 TABLET BY MOUTH DAILY 45 tablet 3   Multiple Vitamins-Minerals (WOMENS MULTI VITAMIN & MINERAL PO) Take 1 tablet by mouth 2 (two) times daily.  simvastatin (ZOCOR) 5 MG tablet TAKE 1 TABLET BY MOUTH DAILY 90 tablet 1   TURMERIC PO Take 3 tablets by mouth. Takes 2 in the am & 1 in the afternoon     No current facility-administered medications on file prior to visit.    BP 128/88   Pulse 83   Temp 98 F (36.7 C) (Oral)   Ht 5' 5.75" (1.67 m)   Wt 204 lb (92.5 kg)   SpO2 97%   BMI 33.18 kg/m       Objective:   Physical Exam Vitals and nursing note reviewed.  Constitutional:      General: She is not in acute distress.    Appearance: Normal appearance. She is obese. She is not ill-appearing.  HENT:     Head: Normocephalic and atraumatic.     Right Ear: Tympanic membrane, ear canal and external ear normal. There is no impacted cerumen.     Left Ear: Tympanic membrane, ear canal and external ear normal. There is no impacted cerumen.     Nose: Nose normal. No congestion or rhinorrhea.     Mouth/Throat:     Mouth: Mucous membranes are moist.     Pharynx: Oropharynx is clear.  Eyes:     Extraocular Movements: Extraocular movements intact.     Conjunctiva/sclera: Conjunctivae normal.     Pupils: Pupils are equal, round, and reactive to light.  Neck:     Vascular: No carotid bruit.  Cardiovascular:     Rate and Rhythm:  Normal rate and regular rhythm.     Pulses: Normal pulses.     Heart sounds: No murmur heard.    No friction rub. No gallop.  Pulmonary:     Effort: Pulmonary effort is normal.     Breath sounds: Normal breath sounds.  Abdominal:     General: Abdomen is flat. Bowel sounds are normal. There is no distension.     Palpations: Abdomen is soft. There is no mass.     Tenderness: There is no abdominal tenderness. There is no guarding or rebound.     Hernia: No hernia is present.  Musculoskeletal:        General: Normal range of motion.     Cervical back: Normal range of motion and neck supple.  Lymphadenopathy:     Cervical: No cervical adenopathy.  Skin:    General: Skin is warm and dry.     Capillary Refill: Capillary refill takes less than 2 seconds.  Neurological:     General: No focal deficit present.     Mental Status: She is alert and oriented to person, place, and time.  Psychiatric:        Mood and Affect: Mood normal.        Behavior: Behavior normal.        Thought Content: Thought content normal.        Judgment: Judgment normal.       Assessment & Plan:  1. Routine general medical examination at a health care facility Today patient counseled on age appropriate routine health concerns for screening and prevention, each reviewed and up to date or declined. Immunizations reviewed and up to date or declined. Labs ordered and reviewed. Risk factors for depression reviewed and negative. Hearing function and visual acuity are intact. ADLs screened and addressed as needed. Functional ability and level of safety reviewed and appropriate. Education, counseling and referrals performed based on assessed risks today. Patient provided with a copy of personalized plan for  preventive services. - Follow up in one year for CPE  - She needs weight loss   2. Diabetes mellitus treated with oral medication (HCC) - Consider adding Mounjaro or Ozempic  - CBC with Differential/Platelet; Future -  Comprehensive metabolic panel; Future - Lipid panel; Future - TSH; Future - Microalbumin/Creatinine Ratio, Urine; Future - Hemoglobin A1c; Future  3. Essential hypertension - Well controlled. No change in medications  - CBC with Differential/Platelet; Future - Comprehensive metabolic panel; Future - Lipid panel; Future - TSH; Future  4. Mixed hyperlipidemia - Consider increase in statin  - CBC with Differential/Platelet; Future - Comprehensive metabolic panel; Future - Lipid panel; Future - TSH; Future  5. Need for influenza vaccination  - Flu Vaccine Trivalent High Dose (Fluad)  6. Class 1 obesity - Work on lifestyle modifications - CBC with Differential/Platelet; Future - Comprehensive metabolic panel; Future - Lipid panel; Future - TSH; Future - Hemoglobin A1c; Future  Shirline Frees, NP

## 2023-03-13 NOTE — Telephone Encounter (Signed)
Pharmacy Patient Advocate Encounter  Received notification from [facility] [employer] that Prior Authorization for [facility] 2.5MG /0.5ML auto-injectors  has been APPROVED from 03/13/2023 to 03/12/2024. Ran test claim, Copay is $256.32. This test claim was processed through [facility]- copay amounts may vary at other pharmacies due to pharmacy/plan contracts, or as the patient moves through the different stages of their insurance plan.   PA #/Case ID/Reference #: [id number]

## 2023-03-13 NOTE — Telephone Encounter (Signed)
Pt stated that she will try it another way since the medication is too expensive.

## 2023-03-13 NOTE — Patient Instructions (Signed)
It was great seeing you today   We will follow up with you regarding your lab work   Please let me know if you need anything   

## 2023-03-20 ENCOUNTER — Telehealth: Payer: Self-pay | Admitting: Adult Health

## 2023-03-20 NOTE — Telephone Encounter (Signed)
Patient notified of update  and verbalized understanding.

## 2023-03-20 NOTE — Telephone Encounter (Addendum)
Pt states NP is already aware that she was experiencing clogged ears. Pt was offered an OV, but declined, stating she just had a CPE on 03/13/23.  Pt would like to request ear drops to treat a clogged ear.  [facility] [id number] - [place], [place] - [address] Phone: [phone]  Fax: [phone]

## 2023-03-20 NOTE — Telephone Encounter (Signed)
Please advise 

## 2023-03-28 ENCOUNTER — Ambulatory Visit (INDEPENDENT_AMBULATORY_CARE_PROVIDER_SITE_OTHER): Payer: PPO | Admitting: Adult Health

## 2023-03-28 ENCOUNTER — Encounter: Payer: Self-pay | Admitting: Adult Health

## 2023-03-28 VITALS — BP 118/82 | HR 70 | Temp 98.1°F | Ht 65.75 in | Wt 202.0 lb

## 2023-03-28 DIAGNOSIS — H6123 Impacted cerumen, bilateral: Secondary | ICD-10-CM

## 2023-03-28 NOTE — Progress Notes (Signed)
Subjective:    Patient ID: Amanda Eaton, female    DOB: 06/27/1955, 67 y.o.   MRN: [id number]  Ear Fullness    67 year old female who  has a past medical history of DIABETES MELLITUS, TYPE II (11/23/2006), HYPERLIPIDEMIA, BORDERLINE (04/08/2008), HYPERTENSION (11/23/2006), Lyme disease, and WEIGHT GAIN (10/12/2008).  She presents to the office today for concern of cerumen impaction. Both of her ears feel stopped up with mild hearing loss. She has not had any pain or drainage from her ears   Review of Systems See HPI   Past Medical History:  Diagnosis Date   DIABETES MELLITUS, TYPE II 11/23/2006   HYPERLIPIDEMIA, BORDERLINE 04/08/2008   HYPERTENSION 11/23/2006   Lyme disease    WEIGHT GAIN 10/12/2008    Social History   Socioeconomic History   Marital status: Married    Spouse name: Not on file   Number of children: Not on file   Years of education: Not on file   Highest education level: Not on file  Occupational History   Not on file  Tobacco Use   Smoking status: Never   Smokeless tobacco: Never  Vaping Use   Vaping status: Never Used  Substance and Sexual Activity   Alcohol use: No    Alcohol/week: 0.0 standard drinks of alcohol   Drug use: No   Sexual activity: Yes    Partners: Male    Birth control/protection: Surgical    Comment: 1st intercourse- 20, less than 5, married- 46 yrs, hysterectomy  Other Topics Concern   Not on file  Social History Narrative   Works for [employer] as a Engineer, materials for 23 years    Married for 39 years   3 kids and 3 grandchildren. All live locally    Social Determinants of Health   Financial Resource Strain: Low Risk  (12/12/2022)   Overall Financial Resource Strain (CARDIA)    Difficulty of Paying Living Expenses: Not hard at all  Food Insecurity: No Food Insecurity (12/12/2022)   Hunger Vital Sign    Worried About Running Out of Food in the Last Year: Never true    Ran Out of Food in the Last Year: Never true   Transportation Needs: No Transportation Needs (12/12/2022)   PRAPARE - Administrator, Civil Service (Medical): No    Lack of Transportation (Non-Medical): No  Physical Activity: Insufficiently Active (12/12/2022)   Exercise Vital Sign    Days of Exercise per Week: 1 day    Minutes of Exercise per Session: 30 min  Stress: No Stress Concern Present (12/12/2022)   [employer] of Occupational Health - Occupational Stress Questionnaire    Feeling of Stress : Not at all  Social Connections: Unknown (12/12/2022)   Social Connection and Isolation Panel [NHANES]    Frequency of Communication with Friends and Family: Three times a week    Frequency of Social Gatherings with Friends and Family: Once a week    Attends Religious Services: Not on Marketing executive or Organizations: No    Attends Banker Meetings: Never    Marital Status: Married  Catering manager Violence: Not At Risk (12/13/2022)   Humiliation, Afraid, Rape, and Kick questionnaire    Fear of Current or Ex-Partner: No    Emotionally Abused: No    Physically Abused: No    Sexually Abused: No    Past Surgical History:  Procedure Laterality Date   ABDOMINAL HYSTERECTOMY  ANTERIOR AND POSTERIOR REPAIR N/A 07/17/2012   Procedure: ANTERIOR (CYSTOCELE) AND POSTERIOR REPAIR (RECTOCELE);  Surgeon: Genia Del, MD;  Location: [facility];  Service: Gynecology;  Laterality: N/A;   CATARACT EXTRACTION  04/24/2013   HYSTEROSCOPY W/ ENDOMETRIAL ABLATION     1987   MYOMECTOMY VAGINAL APPROACH     ROBOTIC ASSISTED TOTAL HYSTERECTOMY N/A 07/17/2012   Procedure: ROBOTIC ASSISTED TOTAL HYSTERECTOMY with Utero-sacral Ligament Suspension;  Surgeon: Genia Del, MD;  Location: [facility];  Service: Gynecology;  Laterality: N/A;    Family History  Problem Relation Age of Onset   Diabetes Mother    Hyperlipidemia Mother    Hypertension Mother    COPD Father    Lung cancer Brother        lung    Diabetes Maternal Grandfather    Cancer Daughter    Colon cancer Neg Hx     No Known Allergies  Current Outpatient Medications on File Prior to Visit  Medication Sig Dispense Refill   ibuprofen (ADVIL,MOTRIN) 200 MG tablet Take 400 mg by mouth every 6 (six) hours as needed for moderate pain.     JANUMET XR [phone] MG TB24 TAKE 1 TABLET BY MOUTH DAILY 90 tablet 1   lisinopril-hydrochlorothiazide (ZESTORETIC) 10-12.5 MG tablet TAKE 1/2 TABLET BY MOUTH DAILY 45 tablet 3   Multiple Vitamins-Minerals (WOMENS MULTI VITAMIN & MINERAL PO) Take 1 tablet by mouth 2 (two) times daily.     simvastatin (ZOCOR) 5 MG tablet TAKE 1 TABLET BY MOUTH DAILY 90 tablet 1   tirzepatide (MOUNJARO) 2.5 MG/0.5ML Pen Inject 2.5 mg into the skin once a week. 2 mL 0   TURMERIC PO Take 3 tablets by mouth. Takes 2 in the am & 1 in the afternoon     No current facility-administered medications on file prior to visit.    BP 118/82   Pulse 70   Temp 98.1 F (36.7 C) (Oral)   Ht 5' 5.75" (1.67 m)   Wt 202 lb (91.6 kg)   SpO2 98%   BMI 32.85 kg/m       Objective:   Physical Exam Vitals and nursing note reviewed.  Constitutional:      Appearance: Normal appearance.  HENT:     Right Ear: There is impacted cerumen.     Left Ear: There is impacted cerumen.  Skin:    General: Skin is warm and dry.  Neurological:     General: No focal deficit present.     Mental Status: She is alert and oriented to person, place, and time.  Psychiatric:        Mood and Affect: Mood normal.        Behavior: Behavior normal.        Thought Content: Thought content normal.        Judgment: Judgment normal.       Assessment & Plan:  1. Bilateral impacted cerumen Verbal consent obtained. Warm water was applied and gentle ear lavage performed to bilateral ears. There were no complications and following the disimpaction the tympanic membrane were visible bilaterally. Tympanic membranes are intact following the procedure.   Auditory canals are normal.  The patient reported relief of symptoms after removal of cerumen. Patient tolerated procedure well.   Shirline Frees, NP

## 2023-07-20 ENCOUNTER — Other Ambulatory Visit: Payer: Self-pay | Admitting: Adult Health

## 2023-07-20 DIAGNOSIS — E782 Mixed hyperlipidemia: Secondary | ICD-10-CM

## 2023-08-18 ENCOUNTER — Other Ambulatory Visit: Payer: Self-pay | Admitting: Adult Health

## 2023-08-18 DIAGNOSIS — E118 Type 2 diabetes mellitus with unspecified complications: Secondary | ICD-10-CM

## 2023-12-04 DIAGNOSIS — H35372 Puckering of macula, left eye: Secondary | ICD-10-CM | POA: Diagnosis not present

## 2023-12-04 DIAGNOSIS — H52223 Regular astigmatism, bilateral: Secondary | ICD-10-CM | POA: Diagnosis not present

## 2023-12-04 DIAGNOSIS — H5203 Hypermetropia, bilateral: Secondary | ICD-10-CM | POA: Diagnosis not present

## 2023-12-04 DIAGNOSIS — H59812 Chorioretinal scars after surgery for detachment, left eye: Secondary | ICD-10-CM | POA: Diagnosis not present

## 2023-12-04 DIAGNOSIS — E119 Type 2 diabetes mellitus without complications: Secondary | ICD-10-CM | POA: Diagnosis not present

## 2023-12-04 DIAGNOSIS — H524 Presbyopia: Secondary | ICD-10-CM | POA: Diagnosis not present

## 2023-12-04 DIAGNOSIS — I10 Essential (primary) hypertension: Secondary | ICD-10-CM | POA: Diagnosis not present

## 2023-12-08 ENCOUNTER — Other Ambulatory Visit: Payer: Self-pay | Admitting: Adult Health

## 2023-12-08 DIAGNOSIS — I10 Essential (primary) hypertension: Secondary | ICD-10-CM

## 2023-12-18 ENCOUNTER — Encounter: Payer: Self-pay | Admitting: Family Medicine

## 2023-12-18 ENCOUNTER — Ambulatory Visit (INDEPENDENT_AMBULATORY_CARE_PROVIDER_SITE_OTHER): Payer: PPO | Admitting: Family Medicine

## 2023-12-18 DIAGNOSIS — Z0000 Encounter for general adult medical examination without abnormal findings: Secondary | ICD-10-CM | POA: Diagnosis not present

## 2023-12-18 NOTE — Progress Notes (Signed)
 PATIENT CHECK-IN and HEALTH RISK ASSESSMENT QUESTIONNAIRE:  -completed by phone/video for upcoming Medicare Preventive Visit   Pre-Visit Check-in: 1)Vitals (height, wt, BP, etc) - record in vitals section for visit on day of visit Request home vitals (wt, BP, etc.) and enter into vitals, THEN update Vital Signs SmartPhrase below at the top of the HPI. See below.  2)Review and Update Medications, Allergies PMH, Surgeries, Social history in Epic 3)Hospitalizations in the last year with date/reason? NO   4)Review and Update Care Team (patient's specialists) in Epic 5) Complete PHQ9 in Epic  6) Complete Fall Screening in Epic 7)Review all Health Maintenance Due and order if not done.  Medicare Wellness Patient Questionnaire:  Answer theses question about your habits: How often do you have a drink containing alcohol?No  How many drinks containing alcohol do you have on a typical day when you are drinking?No  How often do you have six or more drinks on one occasion?NO  Have you ever smoked?Na  Quit date if applicable? Na   How many packs a day do/did you smoke? Na  Do you use smokeless tobacco?No  Do you use an illicit drugs?No  On average, how many days per week do you engage in moderate to strenuous exercise (like a brisk walk)?No  On average, how many minutes do you engage in exercise at this level?Na Are you sexually active? Yes Number of partners?One  Typical breakfast: eggs english, coffee  Typical lunch:sandwich or apple/ banana cottage cheese  Typical dinner:fish or chicken, vegetable, potatoes  Typical snacks: cheese  Beverages: Coffee, water, soda   Answer theses question about your everyday activities: Can you perform most household chores?Yes  Are you deaf or have significant trouble hearing?yes, both  Do you feel that you have a problem with memory?no  Do you feel safe at home?Yes  Last dentist visit?6 month ago  8. Do you have any difficulty performing your everyday  activities?no  Are you having any difficulty walking, taking medications on your own, and or difficulty managing daily home needs?no  Do you have difficulty walking or climbing stairs?no  Do you have difficulty dressing or bathing?no  Do you have difficulty doing errands alone such as visiting a doctor's office or shopping?no  Do you currently have any difficulty preparing food and eating?no  Do you currently have any difficulty using the toilet?no  Do you have any difficulty managing your finances?no  Do you have any difficulties with housekeeping of managing your housekeeping?no    Do you have Advanced Directives in place (Living Will, Healthcare Power or Attorney)? No    Last eye Exam and location?2 weeks ago, Dr. Milissa    Do you currently use prescribed or non-prescribed narcotic or opioid pain medications?No   Do you have a history or close family history of breast, ovarian, tubal or peritoneal cancer or a family member with BRCA (breast cancer susceptibility 1 and 2) gene mutations? No   Nurse/Assistant Credentials/time stamp: Rea A.Wright CMA 3:00 pm     ----------------------------------------------------------------------------------------------------------------------------------------------------------------------------------------------------------------------  Because this visit was a virtual/telehealth visit, some criteria may be missing or patient reported. Any vitals not documented were not able to be obtained and vitals that have been documented are patient reported.    MEDICARE ANNUAL PREVENTIVE VISIT WITH PROVIDER: (Welcome to Medicare, initial annual wellness or annual wellness exam)  Virtual Visit via Video Note  I connected with Angila Gwinner on 12/18/23 by video enabled telemedicine application and verified that I am speaking with the correct  person using two identifiers.  Location patient: home Location provider:work or home office Persons participating  in the virtual visit: patient, provider  Concerns and/or follow up today: no concerns   See HM section in Epic for other details of completed HM.    ROS: negative for report of fevers, unintentional weight loss, vision changes, vision loss, hearing loss or change, chest pain, sob, hemoptysis, melena, hematochezia, hematuria, falls, bleeding or bruising, thoughts of suicide or self harm, memory loss  Patient-completed extensive health risk assessment - reviewed and discussed with the patient: See Health Risk Assessment completed with patient prior to the visit either above or in recent phone note. This was reviewed in detailed with the patient today and appropriate recommendations, orders and referrals were placed as needed per Summary below and patient instructions.   Review of Medical History: -PMH, PSH, Family History and current specialty and care providers reviewed and updated and listed below   Patient Care Team: Merna Huxley, NP as PCP - General (Family Medicine) Lavoie, Marie-Lyne, MD as Consulting Physician (Obstetrics and Gynecology)   Past Medical History:  Diagnosis Date   DIABETES MELLITUS, TYPE II 11/23/2006   HYPERLIPIDEMIA, BORDERLINE 04/08/2008   HYPERTENSION 11/23/2006   Lyme disease    WEIGHT GAIN 10/12/2008    Past Surgical History:  Procedure Laterality Date   ABDOMINAL HYSTERECTOMY     ANTERIOR AND POSTERIOR REPAIR N/A 07/17/2012   Procedure: ANTERIOR (CYSTOCELE) AND POSTERIOR REPAIR (RECTOCELE);  Surgeon: Marie-Lyne Lavoie, MD;  Location: [facility];  Service: Gynecology;  Laterality: N/A;   CATARACT EXTRACTION  04/24/2013   HYSTEROSCOPY W/ ENDOMETRIAL ABLATION     1987   MYOMECTOMY VAGINAL APPROACH     ROBOTIC ASSISTED TOTAL HYSTERECTOMY N/A 07/17/2012   Procedure: ROBOTIC ASSISTED TOTAL HYSTERECTOMY with Utero-sacral Ligament Suspension;  Surgeon: Marie-Lyne Lavoie, MD;  Location: [facility];  Service: Gynecology;  Laterality: N/A;    Social History    Socioeconomic History   Marital status: Married    Spouse name: Not on file   Number of children: Not on file   Years of education: Not on file   Highest education level: Not on file  Occupational History   Not on file  Tobacco Use   Smoking status: Never   Smokeless tobacco: Never  Vaping Use   Vaping status: Never Used  Substance and Sexual Activity   Alcohol use: No    Alcohol/week: 0.0 standard drinks of alcohol   Drug use: No   Sexual activity: Yes    Partners: Male    Birth control/protection: Surgical    Comment: 1st intercourse- 20, less than 5, married- 46 yrs, hysterectomy  Other Topics Concern   Not on file  Social History Narrative   Works for [employer] as a Engineer, materials for 23 years    Married for 39 years   3 kids and 3 grandchildren. All live locally    Social Drivers of Health   Financial Resource Strain: Low Risk  (12/12/2022)   Overall Financial Resource Strain (CARDIA)    Difficulty of Paying Living Expenses: Not hard at all  Food Insecurity: No Food Insecurity (12/12/2022)   Hunger Vital Sign    Worried About Running Out of Food in the Last Year: Never true    Ran Out of Food in the Last Year: Never true  Transportation Needs: No Transportation Needs (12/12/2022)   PRAPARE - Administrator, Civil Service (Medical): No    Lack of Transportation (Non-Medical): No  Physical Activity: Insufficiently Active (12/12/2022)   Exercise Vital Sign    Days of Exercise per Week: 1 day    Minutes of Exercise per Session: 30 min  Stress: No Stress Concern Present (12/12/2022)   [employer] of Occupational Health - Occupational Stress Questionnaire    Feeling of Stress : Not at all  Social Connections: Unknown (12/12/2022)   Social Connection and Isolation Panel    Frequency of Communication with Friends and Family: Three times a week    Frequency of Social Gatherings with Friends and Family: Once a week    Attends Religious Services: Not on file     Active Member of Clubs or Organizations: No    Attends Banker Meetings: Never    Marital Status: Married  Catering manager Violence: Not At Risk (12/13/2022)   Humiliation, Afraid, Rape, and Kick questionnaire    Fear of Current or Ex-Partner: No    Emotionally Abused: No    Physically Abused: No    Sexually Abused: No    Family History  Problem Relation Age of Onset   Diabetes Mother    Hyperlipidemia Mother    Hypertension Mother    COPD Father    Lung cancer Brother        lung   Diabetes Maternal Grandfather    Cancer Daughter    Colon cancer Neg Hx     Current Outpatient Medications on File Prior to Visit  Medication Sig Dispense Refill   ibuprofen  (ADVIL ,MOTRIN ) 200 MG tablet Take 400 mg by mouth every 6 (six) hours as needed for moderate pain.     JANUMET  XR [phone] MG TB24 TAKE 1 TABLET BY MOUTH DAILY 90 tablet 1   lisinopril -hydrochlorothiazide  (ZESTORETIC ) 10-12.5 MG tablet TAKE 1/2 TABLET BY MOUTH DAILY 45 tablet 3   Multiple Vitamins-Minerals (WOMENS MULTI VITAMIN & MINERAL PO) Take 1 tablet by mouth 2 (two) times daily.     simvastatin  (ZOCOR ) 5 MG tablet TAKE 1 TABLET BY MOUTH DAILY 90 tablet 1   TURMERIC PO Take 3 tablets by mouth. Takes 2 in the am & 1 in the afternoon     tirzepatide  (MOUNJARO ) 2.5 MG/0.5ML Pen Inject 2.5 mg into the skin once a week. (Patient not taking: Reported on 12/18/2023) 2 mL 0   No current facility-administered medications on file prior to visit.    No Known Allergies     Physical Exam Vitals requested from patient and listed below if patient had equipment and was able to obtain at home for this virtual visit: There were no vitals filed for this visit. Estimated body mass index is 32.85 kg/m as calculated from the following:   Height as of 03/28/23: 5' 5.75 (1.67 m).   Weight as of 03/28/23: 202 lb (91.6 kg).  EKG (optional): deferred due to virtual visit  GENERAL: alert, oriented, no acute distress  detected, full vision exam deferred due to pandemic and/or virtual encounter  HEENT: atraumatic, conjunttiva clear, no obvious abnormalities on inspection of external nose and ears  NECK: normal movements of the head and neck  LUNGS: on inspection no signs of respiratory distress, breathing rate appears normal, no obvious gross SOB, gasping or wheezing  CV: no obvious cyanosis  MS: moves all visible extremities without noticeable abnormality  PSYCH/NEURO: pleasant and cooperative, no obvious depression or anxiety, speech and thought processing grossly intact, Cognitive function grossly intact  [employer] Visit from 12/18/2023 in [facility] Total Score  0        12/18/2023    2:54 PM 03/13/2023    7:09 AM 12/13/2022    2:15 PM 09/05/2022    7:09 AM 03/07/2022    7:08 AM  Depression screen PHQ 2/9  Decreased Interest 0 0 0 0 0  Down, Depressed, Hopeless 0 0 0 0 0  PHQ - 2 Score 0 0 0 0 0  Altered sleeping 0 0  0 0  Tired, decreased energy 0 1  0 0  Change in appetite 0 1  1 0  Feeling bad or failure about yourself  0 0  0 0  Trouble concentrating 0 0  0 0  Moving slowly or fidgety/restless 0 0  0 0  Suicidal thoughts 0 0  0 0  PHQ-9 Score 0 2  1 0  Difficult doing work/chores  Not difficult at all  Not difficult at all Not difficult at all       03/07/2022    7:08 AM 09/05/2022    7:09 AM 12/12/2022   11:27 AM 03/13/2023    7:09 AM 12/18/2023    2:54 PM  Fall Risk  Falls in the past year? 0 0 0 0 0  Was there an injury with Fall? 0 0 0 0 0  Fall Risk Category Calculator 0 0  0 0  Fall Risk Category (Retired) Low       (RETIRED) Patient Fall Risk Level Low fall risk       Patient at Risk for Falls Due to No Fall Risks No Fall Risks  No Fall Risks No Fall Risks  Fall risk Follow up Falls evaluation completed  Falls evaluation completed  Falls evaluation completed      Data saved with a previous flowsheet row definition      SUMMARY AND PLAN:  Encounter for Medicare annual wellness exam   Discussed applicable health maintenance/preventive health measures and advised and referred or ordered per patient preferences: -she plans to call her ob/gyn about scheduling mammogram and bone density -says she has appt in office in [place] to do labs and flu shot -says she plans to get her covid vaccines at the pharmacy Health Maintenance  Topic Date Due   Diabetic kidney evaluation - Urine ACR  10/13/2008   COVID-19 Vaccine (8 - 2024-25 season) 01/03/2024 (Originally 04/23/2023)   HEMOGLOBIN A1C  03/19/2024 (Originally 09/10/2023)   INFLUENZA VACCINE  07/22/2024 (Originally 11/23/2023)   MAMMOGRAM  12/19/2023   Diabetic kidney evaluation - eGFR measurement  03/12/2024   FOOT EXAM  03/12/2024   OPHTHALMOLOGY EXAM  12/03/2024   Medicare Annual Wellness (AWV)  12/17/2024   Colonoscopy  06/27/2025   DTaP/Tdap/Td (3 - Td or Tdap) 03/07/2027   Pneumococcal Vaccine: 50+ Years  Completed   DEXA SCAN  Completed   Hepatitis C Screening  Completed   Zoster Vaccines- Shingrix  Completed   HPV VACCINES  Aged Out   Meningococcal B Vaccine  Aged Out      Education and counseling on the following was provided based on the above review of health and a plan/checklist for the patient, along with additional information discussed, was provided for the patient in the patient instructions :    -Advised and counseled on a healthy lifestyle - including the importance of a healthy diet, regular physical activity -Reviewed patient's current diet. Advised and counseled on a whole foods based healthy diet. Encouraged to reduce/eliminated ultraprocessed food and beverage, and ensure adequate amounts of healthy  proteins and veggies. Discussed how to ensure consuming whole grains rather than ultraprocessed grains.  A summary of a healthy diet was provided in the Patient Instructions.  -reviewed patient's current physical activity level and  discussed exercise guidelines for adults. Discussed community resources and ideas for safe exercise at home to assist in meeting exercise guideline recommendations in a safe and healthy way.  -Advise yearly dental visits at minimum and regular eye exams -information on advanced directives provided. See patient instructions.   Follow up: see patient instructions     Patient Instructions  I really enjoyed getting to talk with you today! I am available on Tuesdays and Thursdays for virtual visits if you have any questions or concerns, or if I can be of any further assistance.   CHECKLIST FROM ANNUAL WELLNESS VISIT:  -Follow up (please call to schedule if not scheduled after visit):   -yearly for annual wellness visit with primary care office  Here is a list of your preventive care/health maintenance measures and the plan for each if any are due:  PLAN For any measures below that may be due:    1. Please call [facility] to schedule your mammogram and bone density exams and ask that they send reports to us .   2. Please get labs and flu vaccine in the office in the next few months.   3. Can get the covid vaccine at the pharmacy. Please let us  know when you do so that we can update your records.   Health Maintenance  Topic Date Due   Diabetic kidney evaluation - Urine ACR  10/13/2008   COVID-19 Vaccine (8 - 2024-25 season) 01/03/2024 (Originally 04/23/2023)   HEMOGLOBIN A1C  03/19/2024 (Originally 09/10/2023)   INFLUENZA VACCINE  07/22/2024 (Originally 11/23/2023)   MAMMOGRAM  12/19/2023   Diabetic kidney evaluation - eGFR measurement  03/12/2024   FOOT EXAM  03/12/2024   OPHTHALMOLOGY EXAM  12/03/2024   Medicare Annual Wellness (AWV)  12/17/2024   Colonoscopy  06/27/2025   DTaP/Tdap/Td (3 - Td or Tdap) 03/07/2027   Pneumococcal Vaccine: 50+ Years  Completed   DEXA SCAN  Completed   Hepatitis C Screening  Completed   Zoster Vaccines- Shingrix  Completed   HPV VACCINES  Aged Out    Meningococcal B Vaccine  Aged Out    -See a dentist at least yearly  -Get your eyes checked and then per your eye specialist's recommendations  -Other issues addressed today:   -I have included below further information regarding a healthy whole foods based diet, physical activity guidelines for adults, stress management and opportunities for social connections. I hope you find this information useful.   -----------------------------------------------------------------------------------------------------------------------------------------------------------------------------------------------------------------------------------------------------------    NUTRITION: -eat real food: lots of colorful vegetables (half the plate) and fruits -5-7 servings of vegetables and fruits per day (fresh or steamed is best), exp. 2 servings of vegetables with lunch and dinner and 2 servings of fruit per day. Berries and greens such as kale and collards are great choices.  -consume on a regular basis:  fresh fruits, fresh veggies, fish, nuts, seeds, healthy oils (such as olive oil, avocado oil), whole grains (make sure for bread/pasta/crackers/etc., that the first ingredient on label contains the word whole), legumes. -can eat small amounts of dairy and lean meat (no larger than the palm of your hand), but avoid processed meats such as ham, bacon, lunch meat, etc. -drink water -try to avoid fast food and pre-packaged foods, processed meat, ultra processed foods/beverages (donuts, candy, etc.) -  most experts advise limiting sodium to < 2300mg  per day, should limit further is any chronic conditions such as high blood pressure, heart disease, diabetes, etc. The American Heart Association advised that < 1500mg  is is ideal -try to avoid foods/beverages that contain any ingredients with names you do not recognize  -try to avoid foods/beverages  with added sugar or sweeteners/sweets  -try to avoid sweet drinks  (including diet drinks): soda, juice, Gatorade, sweet tea, power drinks, diet drinks -try to avoid white rice, white bread, pasta (unless whole grain)  EXERCISE GUIDELINES FOR ADULTS: -if you wish to increase your physical activity, do so gradually and with the approval of your doctor -STOP and seek medical care immediately if you have any chest pain, chest discomfort or trouble breathing when starting or increasing exercise  -move and stretch your body, legs, feet and arms when sitting for long periods -Physical activity guidelines for optimal health in adults: -get at least 150 minutes per week of moderate exercise (can talk, but not sing); this is about 20-30 minutes of sustained activity 5-7 days per week or two 10-15 minute episodes of sustained activity 5-7 days per week -do some muscle building/resistance training/strength training at least 2 days per week  -balance exercises 3+ days per week:   Stand somewhere where you have something sturdy to hold onto if you lose balance    1) lift up on toes, then back down, start with 5x per day and work up to 20x   2) stand and lift one leg straight out to the side so that foot is a few inches of the floor, start with 5x each side and work up to 20x each side   3) stand on one foot, start with 5 seconds each side and work up to 20 seconds on each side  If you need ideas or help with getting more active:  -Silver sneakers https://tools.silversneakers.com  -Walk with a Doc: http://www.duncan-williams.com/  -try to include resistance (weight lifting/strength building) and balance exercises twice per week: or the following link for ideas: http://castillo-powell.com/  BuyDucts.dk  STRESS MANAGEMENT: -can try meditating, or just sitting quietly with deep breathing while intentionally relaxing all parts of your body for 5 minutes daily -if you need further help  with stress, anxiety or depression please follow up with your primary doctor or contact the wonderful folks at [facility]: [phone]  SOCIAL CONNECTIONS: -options in [place] if you wish to engage in more social and exercise related activities:  -Silver sneakers https://tools.silversneakers.com  -Walk with a Doc: http://www.duncan-williams.com/  -Check out the [facility] Active Adults 50+ section on the [place] of [employer] (hiking clubs, book clubs, cards and games, chess, exercise classes, aquatic classes and much more) - see the website for details: https://www.[place].gov/departments/parks-recreation/active-adults50  -YouTube has lots of exercise videos for different ages and abilities as well  -[facility] (a variety of indoor and outdoor inperson activities for adults). [phone]. [address].  -Virtual Online Classes (a variety of topics): see seniorplanet.org or call [phone]  -consider volunteering at a school, hospice center, church, senior center or elsewhere     ADVANCED HEALTHCARE DIRECTIVES:  [place] Advanced Directives assistance:   ExpressWeek.com.cy  Everyone should have advanced health care directives in place. This is so that you get the care you want, should you ever be in a situation where you are unable to make your own medical decisions.   From the [place] Advanced Directive Website: Advance Health Care Directives are legal documents  in which you give written instructions about your health care if, in the future, you cannot speak for yourself.   A health care power of attorney allows you to name a person you trust to make your health care decisions if you cannot make them yourself. A declaration of a desire for a natural death (or living will) is document, which states that you desire not to have your life prolonged by extraordinary measures if you have a  terminal or incurable illness or if you are in a vegetative state. An advance instruction for mental health treatment makes a declaration of instructions, information and preferences regarding your mental health treatment. It also states that you are aware that the advance instruction authorizes a mental health treatment provider to act according to your wishes. It may also outline your consent or refusal of mental health treatment. A declaration of an anatomical gift allows anyone over the age of 62 to make a gift by will, organ donor card or other document.   Please see the following website or an elder law attorney for forms, FAQs and for completion of advanced directives: [place]  Print production planner Health Care Directives Advance Health Care Directives (http://guzman.[email or web address]/)  Or copy and paste the following to your web browser: [email or web address]         Amanda Eaton Cramp, DO

## 2023-12-18 NOTE — Patient Instructions (Signed)
 I really enjoyed getting to talk with you today! I am available on Tuesdays and Thursdays for virtual visits if you have any questions or concerns, or if I can be of any further assistance.   CHECKLIST FROM ANNUAL WELLNESS VISIT:  -Follow up (please call to schedule if not scheduled after visit):   -yearly for annual wellness visit with primary care office  Here is a list of your preventive care/health maintenance measures and the plan for each if any are due:  PLAN For any measures below that may be due:    1. Please call [facility] to schedule your mammogram and bone density exams and ask that they send reports to us .   2. Please get labs and flu vaccine in the office in the next few months.   3. Can get the covid vaccine at the pharmacy. Please let us  know when you do so that we can update your records.   Health Maintenance  Topic Date Due   Diabetic kidney evaluation - Urine ACR  10/13/2008   COVID-19 Vaccine (8 - 2024-25 season) 01/03/2024 (Originally 04/23/2023)   HEMOGLOBIN A1C  03/19/2024 (Originally 09/10/2023)   INFLUENZA VACCINE  07/22/2024 (Originally 11/23/2023)   MAMMOGRAM  12/19/2023   Diabetic kidney evaluation - eGFR measurement  03/12/2024   FOOT EXAM  03/12/2024   OPHTHALMOLOGY EXAM  12/03/2024   Medicare Annual Wellness (AWV)  12/17/2024   Colonoscopy  06/27/2025   DTaP/Tdap/Td (3 - Td or Tdap) 03/07/2027   Pneumococcal Vaccine: 50+ Years  Completed   DEXA SCAN  Completed   Hepatitis C Screening  Completed   Zoster Vaccines- Shingrix  Completed   HPV VACCINES  Aged Out   Meningococcal B Vaccine  Aged Out    -See a dentist at least yearly  -Get your eyes checked and then per your eye specialist's recommendations  -Other issues addressed today:   -I have included below further information regarding a healthy whole foods based diet, physical activity guidelines for adults, stress management and opportunities for social connections. I hope you find this  information useful.   -----------------------------------------------------------------------------------------------------------------------------------------------------------------------------------------------------------------------------------------------------------    NUTRITION: -eat real food: lots of colorful vegetables (half the plate) and fruits -5-7 servings of vegetables and fruits per day (fresh or steamed is best), exp. 2 servings of vegetables with lunch and dinner and 2 servings of fruit per day. Berries and greens such as kale and collards are great choices.  -consume on a regular basis:  fresh fruits, fresh veggies, fish, nuts, seeds, healthy oils (such as olive oil, avocado oil), whole grains (make sure for bread/pasta/crackers/etc., that the first ingredient on label contains the word whole), legumes. -can eat small amounts of dairy and lean meat (no larger than the palm of your hand), but avoid processed meats such as ham, bacon, lunch meat, etc. -drink water -try to avoid fast food and pre-packaged foods, processed meat, ultra processed foods/beverages (donuts, candy, etc.) -most experts advise limiting sodium to < 2300mg  per day, should limit further is any chronic conditions such as high blood pressure, heart disease, diabetes, etc. The American Heart Association advised that < 1500mg  is is ideal -try to avoid foods/beverages that contain any ingredients with names you do not recognize  -try to avoid foods/beverages  with added sugar or sweeteners/sweets  -try to avoid sweet drinks (including diet drinks): soda, juice, Gatorade, sweet tea, power drinks, diet drinks -try to avoid white rice, white bread, pasta (unless whole grain)  EXERCISE GUIDELINES FOR ADULTS: -if  you wish to increase your physical activity, do so gradually and with the approval of your doctor -STOP and seek medical care immediately if you have any chest pain, chest discomfort or trouble breathing  when starting or increasing exercise  -move and stretch your body, legs, feet and arms when sitting for long periods -Physical activity guidelines for optimal health in adults: -get at least 150 minutes per week of moderate exercise (can talk, but not sing); this is about 20-30 minutes of sustained activity 5-7 days per week or two 10-15 minute episodes of sustained activity 5-7 days per week -do some muscle building/resistance training/strength training at least 2 days per week  -balance exercises 3+ days per week:   Stand somewhere where you have something sturdy to hold onto if you lose balance    1) lift up on toes, then back down, start with 5x per day and work up to 20x   2) stand and lift one leg straight out to the side so that foot is a few inches of the floor, start with 5x each side and work up to 20x each side   3) stand on one foot, start with 5 seconds each side and work up to 20 seconds on each side  If you need ideas or help with getting more active:  -Silver sneakers https://tools.silversneakers.com  -Walk with a Doc: http://www.duncan-williams.com/  -try to include resistance (weight lifting/strength building) and balance exercises twice per week: or the following link for ideas: http://castillo-powell.com/  BuyDucts.dk  STRESS MANAGEMENT: -can try meditating, or just sitting quietly with deep breathing while intentionally relaxing all parts of your body for 5 minutes daily -if you need further help with stress, anxiety or depression please follow up with your primary doctor or contact the wonderful folks at [facility]: [phone]  SOCIAL CONNECTIONS: -options in [place] if you wish to engage in more social and exercise related activities:  -Silver sneakers https://tools.silversneakers.com  -Walk with a Doc: http://www.duncan-williams.com/  -Check out the [facility]  Active Adults 50+ section on the [place] of [employer] (hiking clubs, book clubs, cards and games, chess, exercise classes, aquatic classes and much more) - see the website for details: https://www.[place].gov/departments/parks-recreation/active-adults50  -YouTube has lots of exercise videos for different ages and abilities as well  -[facility] (a variety of indoor and outdoor inperson activities for adults). [phone]. [address].  -Virtual Online Classes (a variety of topics): see seniorplanet.org or call 2144846841  -consider volunteering at a school, hospice center, church, senior center or elsewhere     ADVANCED HEALTHCARE DIRECTIVES:  [place] Advanced Directives assistance:   ExpressWeek.com.cy  Everyone should have advanced health care directives in place. This is so that you get the care you want, should you ever be in a situation where you are unable to make your own medical decisions.   From the [place] Advanced Directive Website: Advance Health Care Directives are legal documents in which you give written instructions about your health care if, in the future, you cannot speak for yourself.   A health care power of attorney allows you to name a person you trust to make your health care decisions if you cannot make them yourself. A declaration of a desire for a natural death (or living will) is document, which states that you desire not to have your life prolonged by extraordinary measures if you have a terminal or incurable illness or if you are in a vegetative state. An advance instruction for mental health  treatment makes a declaration of instructions, information and preferences regarding your mental health treatment. It also states that you are aware that the advance instruction authorizes a mental health treatment provider to act according to your wishes. It may also outline your consent  or refusal of mental health treatment. A declaration of an anatomical gift allows anyone over the age of 64 to make a gift by will, organ donor card or other document.   Please see the following website or an elder law attorney for forms, FAQs and for completion of advanced directives: [place]  Print production planner Health Care Directives Advance Health Care Directives (http://guzman.[email or web address]/)  Or copy and paste the following to your web browser: [email or web address].[email or web address]

## 2023-12-26 DIAGNOSIS — Z1231 Encounter for screening mammogram for malignant neoplasm of breast: Secondary | ICD-10-CM | POA: Diagnosis not present

## 2023-12-26 LAB — HM MAMMOGRAPHY

## 2024-01-26 ENCOUNTER — Other Ambulatory Visit: Payer: Self-pay | Admitting: Adult Health

## 2024-01-26 DIAGNOSIS — E782 Mixed hyperlipidemia: Secondary | ICD-10-CM

## 2024-02-26 ENCOUNTER — Other Ambulatory Visit: Payer: Self-pay | Admitting: Adult Health

## 2024-02-26 DIAGNOSIS — E118 Type 2 diabetes mellitus with unspecified complications: Secondary | ICD-10-CM

## 2024-02-29 NOTE — Progress Notes (Addendum)
 Amanda Eaton                                          MRN: [id number]   02/29/2024   The [facility] Quality Team Specialist reviewed this patient medical record for the purposes of chart review for care gap closure. The following were reviewed: chart review for care gap closure-glycemic status assessment.  05/13/2024- ABSTRACTED GSD 2025    [facility]BCI Quality Team

## 2024-03-13 ENCOUNTER — Ambulatory Visit: Payer: PPO | Admitting: Adult Health

## 2024-03-13 ENCOUNTER — Encounter: Payer: Self-pay | Admitting: Adult Health

## 2024-03-13 ENCOUNTER — Ambulatory Visit: Payer: Self-pay | Admitting: Adult Health

## 2024-03-13 VITALS — BP 110/70 | HR 66 | Temp 97.7°F | Ht 65.25 in | Wt 207.0 lb

## 2024-03-13 DIAGNOSIS — Z7984 Long term (current) use of oral hypoglycemic drugs: Secondary | ICD-10-CM

## 2024-03-13 DIAGNOSIS — R1909 Other intra-abdominal and pelvic swelling, mass and lump: Secondary | ICD-10-CM

## 2024-03-13 DIAGNOSIS — E782 Mixed hyperlipidemia: Secondary | ICD-10-CM

## 2024-03-13 DIAGNOSIS — I10 Essential (primary) hypertension: Secondary | ICD-10-CM

## 2024-03-13 DIAGNOSIS — Z0000 Encounter for general adult medical examination without abnormal findings: Secondary | ICD-10-CM

## 2024-03-13 DIAGNOSIS — Z23 Encounter for immunization: Secondary | ICD-10-CM

## 2024-03-13 DIAGNOSIS — E119 Type 2 diabetes mellitus without complications: Secondary | ICD-10-CM | POA: Diagnosis not present

## 2024-03-13 DIAGNOSIS — K439 Ventral hernia without obstruction or gangrene: Secondary | ICD-10-CM

## 2024-03-13 LAB — COMPREHENSIVE METABOLIC PANEL WITH GFR
ALT: 15 U/L (ref 0–35)
AST: 18 U/L (ref 0–37)
Albumin: 4.3 g/dL (ref 3.5–5.2)
Alkaline Phosphatase: 63 U/L (ref 39–117)
BUN: 31 mg/dL — ABNORMAL HIGH (ref 6–23)
CO2: 28 meq/L (ref 19–32)
Calcium: 9.4 mg/dL (ref 8.4–10.5)
Chloride: 103 meq/L (ref 96–112)
Creatinine, Ser: 0.84 mg/dL (ref 0.40–1.20)
GFR: 70.81 mL/min (ref >60.00)
Glucose, Bld: 99 mg/dL (ref 70–99)
Potassium: 4 meq/L (ref 3.5–5.1)
Sodium: 140 meq/L (ref 135–145)
Total Bilirubin: 0.5 mg/dL (ref 0.2–1.2)
Total Protein: 6.8 g/dL (ref 6.0–8.3)

## 2024-03-13 LAB — CBC WITH DIFFERENTIAL/PLATELET
Basophils Absolute: 0 K/uL (ref 0.0–0.1)
Basophils Relative: 0.4 % (ref 0.0–3.0)
Eosinophils Absolute: 0.1 K/uL (ref 0.0–0.7)
Eosinophils Relative: 2 % (ref 0.0–5.0)
HCT: 39.9 % (ref 36.0–46.0)
Hemoglobin: 13 g/dL (ref 12.0–15.0)
Lymphocytes Relative: 30.8 % (ref 12.0–46.0)
Lymphs Abs: 1.5 K/uL (ref 0.7–4.0)
MCHC: 32.7 g/dL (ref 30.0–36.0)
MCV: 88.1 fl (ref 78.0–100.0)
Monocytes Absolute: 0.4 K/uL (ref 0.1–1.0)
Monocytes Relative: 8.6 % (ref 3.0–12.0)
Neutro Abs: 2.8 K/uL (ref 1.4–7.7)
Neutrophils Relative %: 58.2 % (ref 43.0–77.0)
Platelets: 216 K/uL (ref 150.0–400.0)
RBC: 4.53 Mil/uL (ref 3.87–5.11)
RDW: 13.7 % (ref 11.5–15.5)
WBC: 4.8 K/uL (ref 4.0–10.5)

## 2024-03-13 LAB — LIPID PANEL
Cholesterol: 176 mg/dL (ref 0–200)
HDL: 63.4 mg/dL (ref >39.00)
LDL Cholesterol: 100 mg/dL — ABNORMAL HIGH (ref 0–99)
NonHDL: 112.25
Total CHOL/HDL Ratio: 3
Triglycerides: 62 mg/dL (ref 0.0–149.0)
VLDL: 12.4 mg/dL (ref 0.0–40.0)

## 2024-03-13 LAB — MICROALBUMIN / CREATININE URINE RATIO
Creatinine,U: 138.4 mg/dL
Microalb Creat Ratio: 13.5 mg/g (ref 0.0–30.0)
Microalb, Ur: 1.9 mg/dL (ref 0.0–1.9)

## 2024-03-13 LAB — TSH: TSH: 1.8 u[IU]/mL (ref 0.35–5.50)

## 2024-03-13 LAB — HEMOGLOBIN A1C: Hgb A1c MFr Bld: 6.3 % (ref 4.6–6.5)

## 2024-03-13 MED ORDER — JANUMET XR 100-1000 MG PO TB24: 1.0000 | ORAL_TABLET | Freq: Every day | ORAL | 1 refills | Status: AC

## 2024-03-13 NOTE — Progress Notes (Signed)
 Subjective:    Patient ID: Amanda Eaton, female    DOB: 05-21-1955, 68 y.o.   MRN: [id number]  HPI Patient presents for yearly preventative medicine examination. She is a pleasant 68 year old female who  has a past medical history of DIABETES MELLITUS, TYPE II (11/23/2006), HYPERLIPIDEMIA, BORDERLINE (04/08/2008), HYPERTENSION (11/23/2006), Lyme disease, and WEIGHT GAIN (10/12/2008).  DM type 2 -managed with Janumet  extended release [phone] mg daily. She does monitor her blood sugars at home periodically with readings in the 115 range. She denies any hypoglycemic episodes.  Lab Results  Component Value Date   HGBA1C 6.5 03/13/2023   HGBA1C 5.8 (A) 09/05/2022   HGBA1C 6.5 03/07/2022   Hypertension-managed with Zestoretic  10-12.5 mg daily, she takes a half a tab.  Denies dizziness, lightheadedness, chest pain or shortness of breath  BP Readings from Last 3 Encounters:  03/13/24 110/70  03/28/23 118/82  03/13/23 128/88   Hyperlipidemia-managed with simvastatin  5 mg daily.  She denies myalgia or fatigue Lab Results  Component Value Date   CHOL 171 03/13/2023   HDL 58.10 03/13/2023   LDLCALC 99 03/13/2023   LDLDIRECT 158.8 10/04/2010   TRIG 66.0 03/13/2023   CHOLHDL 3 03/13/2023   Left Flank Mass - she reports that for the last year she has felt a mass in her left lower abdomen/flank. This is not painful but she does feel like it growing in size.    All immunizations and health maintenance protocols were reviewed with the patient and needed orders were placed. She is going to get her flu shot today.   Appropriate screening laboratory values were ordered for the patient including screening of hyperlipidemia, renal function and hepatic function. If indicated by BPH, a PSA was ordered.  Medication reconciliation,  past medical history, social history, problem list and allergies were reviewed in detail with the patient  Goals were established with regard to weight loss, exercise,  and  diet in compliance with medications. She did not exercise much over the summer and her diet suffered but she is back to eating healthy and is walking in the park three days a week with her husband.   Wt Readings from Last 3 Encounters:  03/13/24 207 lb (93.9 kg)  03/28/23 202 lb (91.6 kg)  03/13/23 204 lb (92.5 kg)   She is up to date on colon cancer screening, mammograms, and bone density screening. She is going to call for her Gyn appointment.    Review of Systems  Constitutional: Negative.   HENT: Negative.    Eyes: Negative.   Respiratory: Negative.    Cardiovascular: Negative.   Gastrointestinal: Negative.   Endocrine: Negative.   Genitourinary: Negative.   Musculoskeletal: Negative.   Skin: Negative.        Mass   Allergic/Immunologic: Negative.   Neurological: Negative.   Hematological: Negative.   Psychiatric/Behavioral: Negative.     Past Medical History:  Diagnosis Date   DIABETES MELLITUS, TYPE II 11/23/2006   HYPERLIPIDEMIA, BORDERLINE 04/08/2008   HYPERTENSION 11/23/2006   Lyme disease    WEIGHT GAIN 10/12/2008    Social History   Socioeconomic History   Marital status: Married    Spouse name: Not on file   Number of children: Not on file   Years of education: Not on file   Highest education level: Not on file  Occupational History   Not on file  Tobacco Use   Smoking status: Never   Smokeless tobacco: Never  Vaping Use   Vaping  status: Never Used  Substance and Sexual Activity   Alcohol use: No    Alcohol/week: 0.0 standard drinks of alcohol   Drug use: No   Sexual activity: Yes    Partners: Male    Birth control/protection: Surgical    Comment: 1st intercourse- 20, less than 5, married- 46 yrs, hysterectomy  Other Topics Concern   Not on file  Social History Narrative   Works for [employer] as a engineer, materials for 23 years    Married for 39 years   3 kids and 3 grandchildren. All live locally    Social Drivers of Health   Financial  Resource Strain: Low Risk  (12/12/2022)   Overall Financial Resource Strain (CARDIA)    Difficulty of Paying Living Expenses: Not hard at all  Food Insecurity: No Food Insecurity (12/12/2022)   Hunger Vital Sign    Worried About Running Out of Food in the Last Year: Never true    Ran Out of Food in the Last Year: Never true  Transportation Needs: No Transportation Needs (12/12/2022)   PRAPARE - Administrator, Civil Service (Medical): No    Lack of Transportation (Non-Medical): No  Physical Activity: Insufficiently Active (12/12/2022)   Exercise Vital Sign    Days of Exercise per Week: 1 day    Minutes of Exercise per Session: 30 min  Stress: No Stress Concern Present (12/12/2022)   [employer] of Occupational Health - Occupational Stress Questionnaire    Feeling of Stress : Not at all  Social Connections: Unknown (12/12/2022)   Social Connection and Isolation Panel    Frequency of Communication with Friends and Family: Three times a week    Frequency of Social Gatherings with Friends and Family: Once a week    Attends Religious Services: Not on Marketing Executive or Organizations: No    Attends Banker Meetings: Never    Marital Status: Married  Catering Manager Violence: Not At Risk (12/13/2022)   Humiliation, Afraid, Rape, and Kick questionnaire    Fear of Current or Ex-Partner: No    Emotionally Abused: No    Physically Abused: No    Sexually Abused: No    Past Surgical History:  Procedure Laterality Date   ABDOMINAL HYSTERECTOMY     ANTERIOR AND POSTERIOR REPAIR N/A 07/17/2012   Procedure: ANTERIOR (CYSTOCELE) AND POSTERIOR REPAIR (RECTOCELE);  Surgeon: Marie-Lyne Lavoie, MD;  Location: [facility];  Service: Gynecology;  Laterality: N/A;   CATARACT EXTRACTION  04/24/2013   HYSTEROSCOPY W/ ENDOMETRIAL ABLATION     1987   MYOMECTOMY VAGINAL APPROACH     ROBOTIC ASSISTED TOTAL HYSTERECTOMY N/A 07/17/2012   Procedure: ROBOTIC ASSISTED TOTAL  HYSTERECTOMY with Utero-sacral Ligament Suspension;  Surgeon: Marie-Lyne Lavoie, MD;  Location: [facility];  Service: Gynecology;  Laterality: N/A;    Family History  Problem Relation Age of Onset   Diabetes Mother    Hyperlipidemia Mother    Hypertension Mother    COPD Father    Lung cancer Brother        lung   Diabetes Maternal Grandfather    Cancer Daughter    Colon cancer Neg Hx     No Known Allergies  Current Outpatient Medications on File Prior to Visit  Medication Sig Dispense Refill   ibuprofen  (ADVIL ,MOTRIN ) 200 MG tablet Take 400 mg by mouth every 6 (six) hours as needed for moderate pain.     JANUMET  XR [phone] MG TB24 TAKE  1 TABLET BY MOUTH DAILY 90 tablet 1   lisinopril -hydrochlorothiazide  (ZESTORETIC ) 10-12.5 MG tablet TAKE 1/2 TABLET BY MOUTH DAILY 45 tablet 3   Multiple Vitamins-Minerals (WOMENS MULTI VITAMIN & MINERAL PO) Take 1 tablet by mouth 2 (two) times daily.     simvastatin  (ZOCOR ) 5 MG tablet TAKE 1 TABLET BY MOUTH DAILY 90 tablet 0   TURMERIC PO Take 3 tablets by mouth. Takes 2 in the am & 1 in the afternoon     tirzepatide  (MOUNJARO ) 2.5 MG/0.5ML Pen Inject 2.5 mg into the skin once a week. (Patient not taking: Reported on 12/18/2023) 2 mL 0   No current facility-administered medications on file prior to visit.    BP 110/70   Pulse 66   Temp 97.7 F (36.5 C) (Oral)   Ht 5' 5.25 (1.657 m)   Wt 207 lb (93.9 kg)   SpO2 99%   BMI 34.18 kg/m       Objective:   Physical Exam Vitals and nursing note reviewed.  Constitutional:      General: She is not in acute distress.    Appearance: Normal appearance. She is not ill-appearing.  HENT:     Head: Normocephalic and atraumatic.     Right Ear: Tympanic membrane, ear canal and external ear normal. There is no impacted cerumen.     Left Ear: Tympanic membrane, ear canal and external ear normal. There is no impacted cerumen.     Nose: Nose normal. No congestion or rhinorrhea.     Mouth/Throat:      Mouth: Mucous membranes are moist.     Pharynx: Oropharynx is clear.  Eyes:     Extraocular Movements: Extraocular movements intact.     Conjunctiva/sclera: Conjunctivae normal.     Pupils: Pupils are equal, round, and reactive to light.  Neck:     Vascular: No carotid bruit.  Cardiovascular:     Rate and Rhythm: Normal rate and regular rhythm.     Pulses: Normal pulses.     Heart sounds: No murmur heard.    No friction rub. No gallop.  Pulmonary:     Effort: Pulmonary effort is normal.     Breath sounds: Normal breath sounds.  Abdominal:     General: Abdomen is flat. Bowel sounds are normal. There is no distension.     Palpations: Abdomen is soft. There is no mass.     Tenderness: There is no abdominal tenderness. There is no guarding or rebound.     Hernia: No hernia is present.   Musculoskeletal:        General: Normal range of motion.     Cervical back: Normal range of motion and neck supple.  Lymphadenopathy:     Cervical: No cervical adenopathy.  Skin:    General: Skin is warm and dry.     Capillary Refill: Capillary refill takes less than 2 seconds.  Neurological:     General: No focal deficit present.     Mental Status: She is alert and oriented to person, place, and time.  Psychiatric:        Mood and Affect: Mood normal.        Behavior: Behavior normal.        Thought Content: Thought content normal.        Judgment: Judgment normal.        Assessment & Plan:  1. Routine general medical examination at a health care facility (Primary) Today patient counseled on age appropriate routine health concerns  for screening and prevention, each reviewed and up to date or declined. Immunizations reviewed and up to date or declined. Labs ordered and reviewed. Risk factors for depression reviewed and negative. Hearing function and visual acuity are intact. ADLs screened and addressed as needed. Functional ability and level of safety reviewed and appropriate. Education,  counseling and referrals performed based on assessed risks today. Patient provided with a copy of personalized plan for preventive services. - Continue to exercise and eat healthy  - Follow up in one year or sooner if needed  2. Diabetes mellitus treated with oral medication (HCC) - Consider adding agent.  - Follow up in 3-6 months depending on A1c - CBC with Differential/Platelet; Future - Comprehensive metabolic panel with GFR; Future - Lipid panel; Future - TSH; Future - Hemoglobin A1c; Future - Microalbumin/Creatinine Ratio, Urine; Future - SitaGLIPtin -MetFORMIN  HCl (JANUMET  XR) [phone] MG TB24; Take 1 tablet by mouth daily.  Dispense: 90 tablet; Refill: 1  3. Essential hypertension - well controlled. No change in medication  - CBC with Differential/Platelet; Future - Comprehensive metabolic panel with GFR; Future - Lipid panel; Future - TSH; Future  4. Mixed hyperlipidemia - Consider increase in statin  - CBC with Differential/Platelet; Future - Comprehensive metabolic panel with GFR; Future - Lipid panel; Future - TSH; Future  5. Need for influenza vaccination  - Flu vaccine HIGH DOSE PF(Fluzone Trivalent)  6. Left flank mass  - CT ABDOMEN PELVIS WO CONTRAST; Future  Darleene Shape, NP

## 2024-03-13 NOTE — Patient Instructions (Signed)
 It was great seeing you today   We will follow up with you regarding your lab work   Please let me know if you need anything

## 2024-03-14 MED ORDER — SIMVASTATIN 20 MG PO TABS: 20.0000 mg | ORAL_TABLET | Freq: Every day | ORAL | 1 refills | Status: AC

## 2024-03-18 ENCOUNTER — Ambulatory Visit
Admission: RE | Admit: 2024-03-18 | Discharge: 2024-03-18 | Disposition: A | Discharge status: Home / Self Care | Source: Ambulatory Visit | Attending: Adult Health | Admitting: Adult Health

## 2024-03-18 DIAGNOSIS — R1909 Other intra-abdominal and pelvic swelling, mass and lump: Secondary | ICD-10-CM

## 2024-03-18 DIAGNOSIS — R1904 Left lower quadrant abdominal swelling, mass and lump: Secondary | ICD-10-CM | POA: Diagnosis not present

## 2024-04-03 ENCOUNTER — Ambulatory Visit: Payer: Self-pay | Admitting: General Surgery

## 2024-04-03 DIAGNOSIS — K439 Ventral hernia without obstruction or gangrene: Secondary | ICD-10-CM | POA: Diagnosis not present

## 2024-04-26 ENCOUNTER — Other Ambulatory Visit: Payer: Self-pay | Admitting: Adult Health

## 2024-04-26 DIAGNOSIS — E782 Mixed hyperlipidemia: Secondary | ICD-10-CM

## 2024-05-05 NOTE — Pre-Procedure Instructions (Signed)
 Surgical Instructions   Your procedure is scheduled on May 12, 2024. Report to [facility] Main Entrance A at 0530 A.M., then check in with the Admitting office. Any questions or running late day of surgery: call [phone]  Questions prior to your surgery date: call [phone], Monday-Friday, 8am-4pm. If you experience any cold or flu symptoms such as cough, fever, chills, shortness of breath, etc. between now and your scheduled surgery, please notify us  at the above number.     Remember:  Do not eat after midnight the night before your surgery   You may drink clear liquids until 0430 the morning of your surgery.   Clear liquids allowed are: Water, Non-Citrus Juices (without pulp), Carbonated Beverages, Clear Tea (no milk, honey, etc.), Black Coffee Only (NO MILK, CREAM OR POWDERED CREAMER of any kind), and Gatorade.    Take these medicines the morning of surgery with A SIP OF WATER  NONE     One week prior to surgery, STOP taking any Aspirin (unless otherwise instructed by your surgeon) Aleve, Naproxen, Ibuprofen , Motrin , Advil , Goody's, BC's, all herbal medications, fish oil, and non-prescription vitamins.      WHAT DO I DO ABOUT MY DIABETES MEDICATION?   Do not take SitaGLIPtin -MetFORMIN  HCl (JANUMET  XR)  the morning of surgery .   HOW TO MANAGE YOUR DIABETES BEFORE AND AFTER SURGERY  Why is it important to control my blood sugar before and after surgery? Improving blood sugar levels before and after surgery helps healing and can limit problems. A way of improving blood sugar control is eating a healthy diet by:  Eating less sugar and carbohydrates  Increasing activity/exercise  Talking with your doctor about reaching your blood sugar goals High blood sugars (greater than 180 mg/dL) can raise your risk of infections and slow your recovery, so you will need to focus on controlling your diabetes during the weeks before surgery. Make sure that the doctor who takes  care of your diabetes knows about your planned surgery including the date and location.  How do I manage my blood sugar before surgery? Check your blood sugar at least 4 times a day, starting 2 days before surgery, to make sure that the level is not too high or low.  Check your blood sugar the morning of your surgery when you wake up and every 2 hours until you get to the Short Stay unit.  If your blood sugar is less than 70 mg/dL, you will need to treat for low blood sugar: Do not take insulin. Treat a low blood sugar (less than 70 mg/dL) with  cup of clear juice (cranberry or apple), 4 glucose tablets, OR glucose gel. Recheck blood sugar in 15 minutes after treatment (to make sure it is greater than 70 mg/dL). If your blood sugar is not greater than 70 mg/dL on recheck, call [phone] for further instructions. Report your blood sugar to the short stay nurse when you get to Short Stay.  If you are admitted to the hospital after surgery: Your blood sugar will be checked by the staff and you will probably be given insulin after surgery (instead of oral diabetes medicines) to make sure you have good blood sugar levels. The goal for blood sugar control after surgery is 80-180 mg/dL.                      Do NOT Smoke (Tobacco/Vaping) for 24 hours prior to your procedure.  If you use a CPAP at night,  you may bring your mask/headgear for your overnight stay.   You will be asked to remove any contacts, glasses, piercing's, hearing aid's, dentures/partials prior to surgery. Please bring cases for these items if needed.    Your surgeon will determine if you are to be admitted or discharged the same day.  Patients discharged the day of surgery will not be allowed to drive home, and someone needs to stay with them for 24 hours.  SURGICAL WAITING ROOM VISITATION Patients may have no more than 2 support people in the waiting area - these visitors may rotate.   Pre-op nurse will coordinate an  appropriate time for 2 ADULT support persons, who may not rotate, to accompany patient in pre-op.  Children under the age of 70 must have an adult with them who is not the patient and must remain in the main waiting area with an adult.  If the patient needs to stay at the hospital during part of their recovery, the visitor guidelines for inpatient rooms apply.  Please refer to the [facility] website for the visitor guidelines for any additional information.   If you received a COVID test during your pre-op visit  it is requested that you wear a mask when out in public, stay away from anyone that may not be feeling well and notify your surgeon if you develop symptoms. If you have been in contact with anyone that has tested positive in the last 10 days please notify you surgeon.      Pre-operative CHG Bathing Instructions   You can play a key role in reducing the risk of infection after surgery. Your skin needs to be as free of germs as possible. You can reduce the number of germs on your skin by washing with CHG (chlorhexidine  gluconate) soap before surgery. CHG is an antiseptic soap that kills germs and continues to kill germs even after washing.   DO NOT use if you have an allergy to chlorhexidine /CHG or antibacterial soaps. If your skin becomes reddened or irritated, stop using the CHG and notify one of our RNs at [phone].              TAKE A SHOWER THE NIGHT BEFORE SURGERY   Please keep in mind the following:  DO NOT shave, including legs and underarms, 48 hours prior to surgery.   You may shave your face before/day of surgery.  Place clean sheets on your bed the night before surgery Use a clean washcloth (not used since being washed) for shower. DO NOT sleep with pet's night before surgery.  CHG Shower Instructions:  Wash your face and private area with normal soap. If you choose to wash your hair, wash first with your normal shampoo.  After you use shampoo/soap, rinse your  hair and body thoroughly to remove shampoo/soap residue.  Turn the water OFF and apply half the bottle of CHG soap to a CLEAN washcloth.  Apply CHG soap ONLY FROM YOUR NECK DOWN TO YOUR TOES (washing for 3-5 minutes)  DO NOT use CHG soap on face, private areas, open wounds, or sores.  Pay special attention to the area where your surgery is being performed.  If you are having back surgery, having someone wash your back for you may be helpful. Wait 2 minutes after CHG soap is applied, then you may rinse off the CHG soap.  Pat dry with a clean towel  Put on clean pajamas    Additional instructions for the day of surgery: If  you choose, you may shower the morning of surgery with an antibacterial soap.  DO NOT APPLY any lotions, deodorants, cologne, or perfumes.   Do not wear jewelry or makeup Do not wear nail polish, gel polish, artificial nails, or any other type of covering on natural nails (fingers and toes) Do not bring valuables to the hospital. [facility] is not responsible for valuables/personal belongings. Put on clean/comfortable clothes.  Please brush your teeth.  Ask your nurse before applying any prescription medications to the skin.

## 2024-05-06 ENCOUNTER — Encounter (HOSPITAL_COMMUNITY): Payer: Self-pay

## 2024-05-06 ENCOUNTER — Inpatient Hospital Stay (HOSPITAL_COMMUNITY): Admission: RE | Admit: 2024-05-06 | Discharge: 2024-05-06 | Attending: General Surgery

## 2024-05-06 ENCOUNTER — Other Ambulatory Visit: Payer: Self-pay

## 2024-05-06 VITALS — BP 136/87 | HR 70 | Temp 98.2°F | Resp 16 | Ht 65.5 in | Wt 203.0 lb

## 2024-05-06 DIAGNOSIS — K439 Ventral hernia without obstruction or gangrene: Secondary | ICD-10-CM | POA: Diagnosis not present

## 2024-05-06 DIAGNOSIS — E785 Hyperlipidemia, unspecified: Secondary | ICD-10-CM | POA: Diagnosis not present

## 2024-05-06 DIAGNOSIS — N993 Prolapse of vaginal vault after hysterectomy: Secondary | ICD-10-CM | POA: Diagnosis not present

## 2024-05-06 DIAGNOSIS — Z01818 Encounter for other preprocedural examination: Secondary | ICD-10-CM | POA: Insufficient documentation

## 2024-05-06 DIAGNOSIS — E119 Type 2 diabetes mellitus without complications: Secondary | ICD-10-CM | POA: Insufficient documentation

## 2024-05-06 DIAGNOSIS — I10 Essential (primary) hypertension: Secondary | ICD-10-CM | POA: Diagnosis not present

## 2024-05-06 LAB — BASIC METABOLIC PANEL WITH GFR
Anion gap: 6 (ref 5–15)
BUN: 19 mg/dL (ref 8–23)
CO2: 30 mmol/L (ref 22–32)
Calcium: 9.4 mg/dL (ref 8.9–10.3)
Chloride: 105 mmol/L (ref 98–111)
Creatinine, Ser: 0.67 mg/dL (ref 0.44–1.00)
GFR, Estimated: >60 mL/min
Glucose, Bld: 112 mg/dL — ABNORMAL HIGH (ref 70–99)
Potassium: 4.3 mmol/L (ref 3.5–5.1)
Sodium: 141 mmol/L (ref 135–145)

## 2024-05-06 LAB — CBC
HCT: 42 % (ref 36.0–46.0)
Hemoglobin: 13.5 g/dL (ref 12.0–15.0)
MCH: 29 pg (ref 26.0–34.0)
MCHC: 32.1 g/dL (ref 30.0–36.0)
MCV: 90.1 fL (ref 80.0–100.0)
Platelets: 213 K/uL (ref 150–400)
RBC: 4.66 MIL/uL (ref 3.87–5.11)
RDW: 13.2 % (ref 11.5–15.5)
WBC: 4.4 K/uL (ref 4.0–10.5)
nRBC: 0 % (ref 0.0–0.2)

## 2024-05-06 LAB — GLUCOSE, CAPILLARY: Glucose-Capillary: 113 mg/dL — ABNORMAL HIGH (ref 70–99)

## 2024-05-06 NOTE — Progress Notes (Signed)
 PCP - Darleene Shape Cardiologist - denies  PPM/ICD - denies Device Orders - n/a Rep Notified - n/a  Chest x-ray - denies EKG - 05/06/24 Stress Test - denies ECHO - denies Cardiac Cath - denies  Sleep Study - denies CPAP - n/a  Patient checks blood sugar weekly.  Fasting glucose was 106 -115  Last dose of GLP1 agonist-  n/a GLP1 instructions: n/a  Blood Thinner Instructions: n/a Aspirin Instructions: n/a  ERAS Protcol - clears until 0430 PRE-SURGERY Ensure or G2- n/a  COVID TEST- n/a   Anesthesia review: yes - EKG  Patient denies shortness of breath, fever, cough and chest pain at PAT appointment   All instructions explained to the patient, with a verbal understanding of the material. Patient agrees to go over the instructions while at home for a better understanding. Patient also instructed to self quarantine after being tested for COVID-19. The opportunity to ask questions was provided.

## 2024-05-07 NOTE — Anesthesia Preprocedure Evaluation (Addendum)
"                                    Anesthesia Evaluation  Patient identified by MRN, date of birth, ID band Patient awake    Reviewed: Allergy & Precautions, NPO status , Patient's Chart, lab work & pertinent test results  History of Anesthesia Complications Negative for: history of anesthetic complications  Airway Mallampati: II  TM Distance: >3 FB Neck ROM: Full    Dental  (+) Dental Advisory Given   Pulmonary neg pulmonary ROS   breath sounds clear to auscultation       Cardiovascular hypertension, Pt. on medications (-) angina  Rhythm:Regular Rate:Normal     Neuro/Psych negative neurological ROS     GI/Hepatic negative GI ROS,,,  Endo/Other  diabetes (glu 119), Oral Hypoglycemic Agents  BMI 33  Renal/GU      Musculoskeletal   Abdominal   Peds  Hematology Hb 13.5, plt 213k   Anesthesia Other Findings   Reproductive/Obstetrics                              Anesthesia Physical Anesthesia Plan  ASA: 3  Anesthesia Plan: General   Post-op Pain Management: Tylenol  PO (pre-op)*   Induction: Intravenous  PONV Risk Score and Plan: 3 and Ondansetron , Dexamethasone  and Droperidol   Airway Management Planned: Oral ETT  Additional Equipment: None  Intra-op Plan:   Post-operative Plan: Extubation in OR  Informed Consent: I have reviewed the patients History and Physical, chart, labs and discussed the procedure including the risks, benefits and alternatives for the proposed anesthesia with the patient or authorized representative who has indicated his/her understanding and acceptance.     Dental advisory given  Plan Discussed with: CRNA and Surgeon  Anesthesia Plan Comments: (PAT note written 05/07/2024 by Allison Zelenak, PA-C.  )         Anesthesia Quick Evaluation  "

## 2024-05-07 NOTE — Progress Notes (Signed)
 Anesthesia Chart Review:  Case: [id number] Date/Time: 05/12/24 0715   Procedure: REPAIR, HERNIA, VENTRAL, LAPAROSCOPIC - LAPAROSCOPIC ASSISTED VENTRAL HERNIA REPAIR WITH MESH   Anesthesia type: General   Pre-op diagnosis: VENTRAL HERNIA   [facility] ROOM 02 / [facility] OR   Surgeons: Curvin Deward MOULD, MD       DISCUSSION: Patient is a 69 year old female scheduled for the above procedure. She has a symptomatic ventral hernia. Above procedure recommended.   History includes never smoker, DM2, HLD, HTN, Lyme disease, hysterectomy (with AP repair 07/17/2012).   Preoperative EKG and labs reviewed.   VS: BP 136/87   Pulse 70   Temp 36.8 C   Resp 16   Ht 5' 5.5 (1.664 m)   Wt 92.1 kg   SpO2 99%   BMI 33.27 kg/m   PROVIDERS: Merna Huxley, NP is PCP, last visit for routine preventative care follow-up 03/13/2024.    LABS: Labs reviewed: Acceptable for surgery. A1c 6.3 and normal LFTs 03/13/2024.  (all labs ordered are listed, but only abnormal results are displayed)  Labs Reviewed  GLUCOSE, CAPILLARY - Abnormal; Notable for the following components:      Result Value   Glucose-Capillary 113 (*)    All other components within normal limits  BASIC METABOLIC PANEL WITH GFR - Abnormal; Notable for the following components:   Glucose, Bld 112 (*)    All other components within normal limits  CBC     IMAGES: CT Abd/pelvis 03/18/2024: IMPRESSION: - Left lower quadrant Spigelian hernia containing multiple small bowel loops. No evidence of bowel obstruction or other acute findings. - Pelvic floor laxity with mild cystocele.    EKG: 05/06/2024:  Normal sinus rhythm  Cannot rule out Inferior infarct , age undetermined  Cannot rule out Anterior infarct , age undetermined  - When compared to 10/16/2020 EKG the rate is lower, poor rave progression from V3-4.    CV: N/A  Past Medical History:  Diagnosis Date   DIABETES MELLITUS, TYPE II 11/23/2006   HYPERLIPIDEMIA, BORDERLINE  04/08/2008   HYPERTENSION 11/23/2006   Lyme disease    WEIGHT GAIN 10/12/2008    Past Surgical History:  Procedure Laterality Date   ANTERIOR AND POSTERIOR REPAIR N/A 07/17/2012   Procedure: ANTERIOR (CYSTOCELE) AND POSTERIOR REPAIR (RECTOCELE);  Surgeon: Marie-Lyne Lavoie, MD;  Location: [facility] ORS;  Service: Gynecology;  Laterality: N/A;   CATARACT EXTRACTION  04/24/2013   HYSTEROSCOPY W/ ENDOMETRIAL ABLATION     1987   MYOMECTOMY VAGINAL APPROACH     ROBOTIC ASSISTED TOTAL HYSTERECTOMY N/A 07/17/2012   Procedure: ROBOTIC ASSISTED TOTAL HYSTERECTOMY with Utero-sacral Ligament Suspension;  Surgeon: Marie-Lyne Lavoie, MD;  Location: [facility] ORS;  Service: Gynecology;  Laterality: N/A;    MEDICATIONS:  ibuprofen  (ADVIL ,MOTRIN ) 200 MG tablet   lisinopril -hydrochlorothiazide  (ZESTORETIC ) 10-12.5 MG tablet   Multiple Vitamins-Minerals (WOMENS MULTI VITAMIN & MINERAL PO)   simvastatin  (ZOCOR ) 20 MG tablet   SitaGLIPtin -MetFORMIN  HCl (JANUMET  XR) [phone] MG TB24   TURMERIC PO   No current facility-administered medications for this encounter.    Isaiah Ruder, PA-C Surgical Short Stay/Anesthesiology [facility] Phone [phone] [facility] Phone [phone] 05/07/2024 3:54 PM

## 2024-05-12 ENCOUNTER — Encounter (HOSPITAL_COMMUNITY)
Admission: RE | Disposition: A | Discharge status: Home / Self Care | Payer: Self-pay | Source: Ambulatory Visit | Attending: General Surgery

## 2024-05-12 ENCOUNTER — Ambulatory Visit (HOSPITAL_COMMUNITY): Payer: Self-pay | Admitting: Anesthesiology

## 2024-05-12 ENCOUNTER — Ambulatory Visit (HOSPITAL_COMMUNITY): Payer: Self-pay | Admitting: Vascular Surgery

## 2024-05-12 ENCOUNTER — Encounter (HOSPITAL_COMMUNITY): Payer: Self-pay | Admitting: General Surgery

## 2024-05-12 ENCOUNTER — Other Ambulatory Visit: Payer: Self-pay

## 2024-05-12 ENCOUNTER — Ambulatory Visit (HOSPITAL_COMMUNITY)
Admission: RE | Admit: 2024-05-12 | Discharge: 2024-05-12 | Disposition: A | Discharge status: Home / Self Care | Source: Ambulatory Visit | Attending: General Surgery | Admitting: General Surgery

## 2024-05-12 DIAGNOSIS — K439 Ventral hernia without obstruction or gangrene: Secondary | ICD-10-CM | POA: Diagnosis present

## 2024-05-12 DIAGNOSIS — Z8249 Family history of ischemic heart disease and other diseases of the circulatory system: Secondary | ICD-10-CM | POA: Diagnosis not present

## 2024-05-12 DIAGNOSIS — Z833 Family history of diabetes mellitus: Secondary | ICD-10-CM | POA: Insufficient documentation

## 2024-05-12 DIAGNOSIS — Z79899 Other long term (current) drug therapy: Secondary | ICD-10-CM | POA: Insufficient documentation

## 2024-05-12 DIAGNOSIS — Z7984 Long term (current) use of oral hypoglycemic drugs: Secondary | ICD-10-CM | POA: Insufficient documentation

## 2024-05-12 DIAGNOSIS — E785 Hyperlipidemia, unspecified: Secondary | ICD-10-CM | POA: Diagnosis not present

## 2024-05-12 DIAGNOSIS — Z01818 Encounter for other preprocedural examination: Secondary | ICD-10-CM

## 2024-05-12 DIAGNOSIS — E119 Type 2 diabetes mellitus without complications: Secondary | ICD-10-CM | POA: Diagnosis not present

## 2024-05-12 DIAGNOSIS — I10 Essential (primary) hypertension: Secondary | ICD-10-CM | POA: Diagnosis not present

## 2024-05-12 HISTORY — PX: VENTRAL HERNIA REPAIR: SHX424

## 2024-05-12 LAB — GLUCOSE, CAPILLARY
Glucose-Capillary: 119 mg/dL — ABNORMAL HIGH (ref 70–99)
Glucose-Capillary: 175 mg/dL — ABNORMAL HIGH (ref 70–99)

## 2024-05-12 MED ORDER — FENTANYL CITRATE (PF) 250 MCG/5ML IJ SOLN
INTRAMUSCULAR | Status: DC | PRN
  Administered 2024-05-12: 100 ug via INTRAVENOUS

## 2024-05-12 MED ORDER — PHENYLEPHRINE HCL-NACL 20-0.9 MG/250ML-% IV SOLN
INTRAVENOUS | Status: DC | PRN
  Administered 2024-05-12: 30 ug/min via INTRAVENOUS

## 2024-05-12 MED ORDER — ROCURONIUM BROMIDE 10 MG/ML (PF) SYRINGE
PREFILLED_SYRINGE | INTRAVENOUS | Status: DC | PRN
  Administered 2024-05-12: 60 mg via INTRAVENOUS

## 2024-05-12 MED ORDER — OXYCODONE HCL 5 MG/5ML PO SOLN: 5.0000 mg | Freq: Once | ORAL | Status: AC | PRN

## 2024-05-12 MED ORDER — FENTANYL CITRATE (PF) 100 MCG/2ML IJ SOLN
INTRAMUSCULAR | Status: AC
  Filled 2024-05-12: qty 2

## 2024-05-12 MED ORDER — BUPIVACAINE-EPINEPHRINE 0.25% -1:200000 IJ SOLN
INTRAMUSCULAR | Status: DC | PRN
  Administered 2024-05-12: 14 mL

## 2024-05-12 MED ORDER — SUGAMMADEX SODIUM 200 MG/2ML IV SOLN
INTRAVENOUS | Status: DC | PRN
  Administered 2024-05-12: 200 mg via INTRAVENOUS

## 2024-05-12 MED ORDER — OXYCODONE HCL 5 MG PO TABS
ORAL_TABLET | ORAL | Status: AC
  Filled 2024-05-12: qty 1

## 2024-05-12 MED ORDER — ORAL CARE MOUTH RINSE: 15.0000 mL | Freq: Once | OROMUCOSAL | Status: AC

## 2024-05-12 MED ORDER — MIDAZOLAM HCL 2 MG/2ML IJ SOLN
INTRAMUSCULAR | Status: AC
  Filled 2024-05-12: qty 2

## 2024-05-12 MED ORDER — AMISULPRIDE (ANTIEMETIC) 5 MG/2ML IV SOLN
10.0000 mg | Freq: Once | INTRAVENOUS | Status: AC
  Administered 2024-05-12: 10 mg via INTRAVENOUS

## 2024-05-12 MED ORDER — PROPOFOL 10 MG/ML IV BOLUS
INTRAVENOUS | Status: AC
  Filled 2024-05-12: qty 20

## 2024-05-12 MED ORDER — CHLORHEXIDINE GLUCONATE 0.12 % MT SOLN
15.0000 mL | Freq: Once | OROMUCOSAL | Status: AC
  Administered 2024-05-12: 15 mL via OROMUCOSAL

## 2024-05-12 MED ORDER — AMISULPRIDE (ANTIEMETIC) 5 MG/2ML IV SOLN
INTRAVENOUS | Status: AC
  Filled 2024-05-12: qty 4

## 2024-05-12 MED ORDER — ACETAMINOPHEN 500 MG PO TABS
1000.0000 mg | ORAL_TABLET | Freq: Once | ORAL | Status: AC
  Administered 2024-05-12: 1000 mg via ORAL
  Filled 2024-05-12: qty 2

## 2024-05-12 MED ORDER — OXYCODONE HCL 5 MG PO TABS
5.0000 mg | ORAL_TABLET | Freq: Once | ORAL | Status: AC | PRN
  Administered 2024-05-12: 5 mg via ORAL

## 2024-05-12 MED ORDER — GLYCOPYRROLATE 0.2 MG/ML IJ SOLN
INTRAMUSCULAR | Status: DC | PRN
  Administered 2024-05-12: .2 mg via INTRAVENOUS

## 2024-05-12 MED ORDER — HYDROMORPHONE HCL 1 MG/ML IJ SOLN
0.2500 mg | INTRAMUSCULAR | Status: DC | PRN
  Administered 2024-05-12: 0.5 mg via INTRAVENOUS

## 2024-05-12 MED ORDER — HYDROMORPHONE HCL 1 MG/ML IJ SOLN
INTRAMUSCULAR | Status: AC
  Filled 2024-05-12: qty 1

## 2024-05-12 MED ORDER — DEXAMETHASONE SOD PHOSPHATE PF 10 MG/ML IJ SOLN
INTRAMUSCULAR | Status: DC | PRN
  Administered 2024-05-12: 10 mg via INTRAVENOUS

## 2024-05-12 MED ORDER — CHLORHEXIDINE GLUCONATE CLOTH 2 % EX PADS: 6.0000 | MEDICATED_PAD | Freq: Once | CUTANEOUS | Status: DC

## 2024-05-12 MED ORDER — LIDOCAINE 2% (20 MG/ML) 5 ML SYRINGE
INTRAMUSCULAR | Status: DC | PRN
  Administered 2024-05-12: 60 mg via INTRAVENOUS

## 2024-05-12 MED ORDER — ACETAMINOPHEN 500 MG PO TABS: 1000.0000 mg | ORAL_TABLET | ORAL | Status: DC

## 2024-05-12 MED ORDER — PROPOFOL 10 MG/ML IV BOLUS
INTRAVENOUS | Status: DC | PRN
  Administered 2024-05-12: 120 mg via INTRAVENOUS

## 2024-05-12 MED ORDER — ONDANSETRON HCL 4 MG/2ML IJ SOLN
INTRAMUSCULAR | Status: DC | PRN
  Administered 2024-05-12: 4 mg via INTRAVENOUS

## 2024-05-12 MED ORDER — MEPERIDINE HCL 25 MG/ML IJ SOLN: 6.2500 mg | INTRAMUSCULAR | Status: DC | PRN

## 2024-05-12 MED ORDER — CEFAZOLIN SODIUM-DEXTROSE 2-4 GM/100ML-% IV SOLN
2.0000 g | INTRAVENOUS | Status: AC
  Administered 2024-05-12: 2 g via INTRAVENOUS
  Filled 2024-05-12: qty 100

## 2024-05-12 MED ORDER — OXYCODONE HCL 5 MG PO TABS: 5.0000 mg | ORAL_TABLET | Freq: Three times a day (TID) | ORAL | 0 refills | Status: AC | PRN

## 2024-05-12 MED ORDER — MIDAZOLAM HCL (PF) 2 MG/2ML IJ SOLN
INTRAMUSCULAR | Status: DC | PRN
  Administered 2024-05-12: 2 mg via INTRAVENOUS

## 2024-05-12 MED ORDER — PHENYLEPHRINE 80 MCG/ML (10ML) SYRINGE FOR IV PUSH (FOR BLOOD PRESSURE SUPPORT)
PREFILLED_SYRINGE | INTRAVENOUS | Status: DC | PRN
  Administered 2024-05-12: 160 ug via INTRAVENOUS

## 2024-05-12 MED ORDER — MIDAZOLAM HCL (PF) 2 MG/2ML IJ SOLN: 0.5000 mg | Freq: Once | INTRAMUSCULAR | Status: DC | PRN

## 2024-05-12 MED ORDER — BUPIVACAINE-EPINEPHRINE (PF) 0.25% -1:200000 IJ SOLN
INTRAMUSCULAR | Status: AC
  Filled 2024-05-12: qty 30

## 2024-05-12 MED ORDER — LACTATED RINGERS IV SOLN: INTRAVENOUS | Status: DC

## 2024-05-12 MED ORDER — GABAPENTIN 100 MG PO CAPS
100.0000 mg | ORAL_CAPSULE | ORAL | Status: AC
  Administered 2024-05-12: 100 mg via ORAL
  Filled 2024-05-12: qty 1

## 2024-05-12 NOTE — Op Note (Signed)
 05/12/2024  7:30 AM  9:01 AM  PATIENT:  Amanda Eaton  69 y.o. female  PRE-OPERATIVE DIAGNOSIS:  VENTRAL HERNIA  POST-OPERATIVE DIAGNOSIS:  VENTRAL HERNIA  PROCEDURE:  Procedures with comments: REPAIR, HERNIA, VENTRAL, LAPAROSCOPIC (N/A) - LAPAROSCOPIC ASSISTED VENTRAL HERNIA REPAIR WITH MESH  SURGEON:  Surgeons and Role:    * Curvin Deward MOULD, MD - Primary  PHYSICIAN ASSISTANT:   ASSISTANTS: Waddell Collier, RNFA  ANESTHESIA:   local and general  EBL:  minimal   BLOOD ADMINISTERED:none  DRAINS: none   LOCAL MEDICATIONS USED:  MARCAINE      SPECIMEN:  Source of Specimen:  hernia sac  DISPOSITION OF SPECIMEN:  PATHOLOGY  COUNTS:  YES  TOURNIQUET:  * No tourniquets in log *  DICTATION: .Dragon Dictation  After informed consent was obtained the patient was brought to the operating room and placed in the supine position on the operating table.  After adequate induction of general anesthesia the patient's abdomen was prepped with ChloraPrep, allowed to dry, and draped in usual sterile manner including use of an Ioban drape.  An appropriate timeout was performed.  The hernia was in the left lower quadrant.  I elected to access the abdominal cavity in the right upper quadrant.  This area was infiltrated with quarter percent Marcaine .  A small stab incision was made with a 15 blade knife.  A 5 mm Optiview port and camera used to bluntly dissect through the layers of the abdominal wall under direct vision.  Once access was gained to the abdominal cavity then the abdomen was insufflated with carbon oxide without difficulty.  The camera was placed through the port and the abdominal cavity was inspected.  The patient was placed in Trendelenburg position with the left side up.  There were actually 2 hernias side-by-side in the left lower quadrant.  The small bowel fell out of the hernia sac easily.  There were no adhesions to the anterior abdominal wall.  Because of the location of the hernias  there was not a lot of room for overlap.  The 2 hernias together measured approximately 8 cm.  At this point I chose a 12 cm piece of Ovitex LPR mesh.  I then made a vertically oriented incision with a 15 blade knife overlying the hernias.  The incision was carried through the skin and subcutaneous tissue sharply with the electrocautery until the hernia sacs were open.  The hernia sacs were excised sharply with the electrocautery.  The lower fascial defect was closed with interrupted figure-of-eight #1 Novafil stitches.  The mesh was then oriented with the blue side towards the abdominal wall.  I placed four #1 Novafil stitches through the fascial layers of the abdominal wall under direct vision several centimeters back from the edge of the hernia defect, through the mesh, and then back to the abdominal wall.  Each of the stitches was then cinched down and tied.  The fascial defect was then closed with multiple figure-of-eight #1 Novafil stitches.  At this point the abdomen was then insufflated again.  The mesh was observed to be covering the fascial closure well.  The gaps between the stitches were filled in with a secure strap tacker.  Once this was accomplished the mesh appeared to be in good position and the hernia seem well repaired.  The area was completely hemostatic.  The rest of the abdomen was inspected and no other abnormalities were noted.  At this point the gas was allowed to escape and the mesh  was observed to remain in good apposition to the abdominal wall.  Externally another fascial layer was closed with figure-of-eight #1 Novafil stitches.  The subcutaneous tissue was closed with a running 3-0 Vicryl stitch.  The skin incisions were then closed with 4-0 Monocryl subcuticular stitches.  Dermabond dressings were applied.  The patient tolerated the procedure well.  At the end of the case all needle sponge and instrument counts were correct.  The patient was then awakened and taken to recovery in  stable condition.  PLAN OF CARE: Discharge to home after PACU  PATIENT DISPOSITION:  PACU - hemodynamically stable.   Delay start of Pharmacological VTE agent (>24hrs) due to surgical blood loss or risk of bleeding: not applicable

## 2024-05-12 NOTE — H&P (Signed)
 " REFERRING PHYSICIAN: Merna Huxley, NP PROVIDER: DEWARD GARNETTE NULL, MD MRN: [id number] DOB: 1955-12-17 Subjective   Chief Complaint: Hernia  History of Present Illness: Amanda Eaton is a 69 y.o. female who is seen today as an office consultation for evaluation of Hernia  We are asked to see the patient in consultation by Dr. Merna to evaluate her for a ventral hernia. The patient is a 69 year old white female who has noticed a bulge on the left side of her abdomen for quite some time. She has some mild discomfort associated with it. She denies any nausea or vomiting. She is otherwise in pretty good health and does not smoke. She did have a CT scan which showed a ventral spigelian type hernia in the left mid abdomen with no sign of obstruction. The fascial defect measured about 4 cm  Review of Systems: A complete review of systems was obtained from the patient. I have reviewed this information and discussed as appropriate with the patient. See HPI as well for other ROS.  ROS   Medical History: Past Medical History:  Diagnosis Date  Borderline hyperlipidemia 04/08/2008  Diabetes mellitus, type II (CMS/HHS-HCC) 11/23/2006  Hypertension 11/23/2006  Lyme disease  Weight gain 10/12/2008   Patient Active Problem List  Diagnosis  Ventral hernia without obstruction or gangrene   Past Surgical History:  Procedure Laterality Date  Robotic assisted total hysterectomy (N/A) 07/17/2012  Anterior and posterior repair (N/A) 07/17/2012  CATARACT EXTRACTION 04/24/2013  ABDOMINAL HYSTERECTOMY  HYSTERECTOMY  HYSTEROSCOPY W/ ENDOMETRIAL ABLATION  MYOMECTOMY VAGINAL APPROACH    No Known Allergies  Current Outpatient Medications on File Prior to Visit  Medication Sig Dispense Refill  ibuprofen  (MOTRIN ) 200 MG tablet Take 400 mg by mouth every 6 (six) hours as needed  lisinopriL -hydroCHLOROthiazide  (ZESTORETIC ) 10-12.5 mg tablet Take 1 tablet by mouth once daily  simvastatin  (ZOCOR ) 20 MG  tablet Take 20 mg by mouth at bedtime  SITagliptin  phos-metFORMIN  (JANUMET  XR) 100-1,000 mg ER 24 hr multiphase tablet Take 1 tablet by mouth daily with dinner   No current facility-administered medications on file prior to visit.   Family History  Problem Relation Age of Onset  Diabetes Mother  High blood pressure (Hypertension) Mother  Hyperlipidemia (Elevated cholesterol) Mother  COPD Father  Lung cancer Brother  Diabetes Maternal Grandfather  Cancer Daughter  Colon cancer Neg Hx    Social History   Tobacco Use  Smoking Status Never  Smokeless Tobacco Never    Social History   Socioeconomic History  Marital status: Married  Tobacco Use  Smoking status: Never  Smokeless tobacco: Never   Social Drivers of Corporate Investment Banker Strain: Low Risk (12/12/2022)  Received from [facility]  Overall Financial Resource Strain (CARDIA)  Difficulty of Paying Living Expenses: Not hard at all  Food Insecurity: No Food Insecurity (12/12/2022)  Received from [facility]  Hunger Vital Sign  Within the past 12 months, you worried that your food would run out before you got the money to buy more.: Never true  Within the past 12 months, the food you bought just didn't last and you didn't have money to get more.: Never true  Transportation Needs: No Transportation Needs (12/12/2022)  Received from [facility] - Transportation  Lack of Transportation (Medical): No  Lack of Transportation (Non-Medical): No  Physical Activity: Insufficiently Active (12/12/2022)  Received from [facility]  Exercise Vital Sign  On average, how many days per week do you engage in moderate to strenuous  exercise (like a brisk walk)?: 1 day  On average, how many minutes do you engage in exercise at this level?: 30 min  Stress: No Stress Concern Present (12/12/2022)  Received from [facility] - Occupational Stress Questionnaire  Feeling of Stress : Not at  all  Social Connections: Unknown (12/12/2022)  Received from [facility]  Social Connection and Isolation Panel  In a typical week, how many times do you talk on the phone with family, friends, or neighbors?: Three times a week  How often do you get together with friends or relatives?: Once a week  Do you belong to any clubs or organizations such as church groups, unions, fraternal or athletic groups, or school groups?: No  How often do you attend meetings of the clubs or organizations you belong to?: Never  Are you married, widowed, divorced, separated, never married, or living with a partner?: Married  Housing Stability: Unknown (04/03/2024)  Housing Stability Vital Sign  Homeless in the Last Year: No   Objective:   Vitals:  BP: 136/86  Weight: 93.4 kg (205 lb 12.8 oz)  Height: 167.6 cm (5' 6)  PainSc: 0-No pain   Body mass index is 33.22 kg/m.  Physical Exam Vitals reviewed.  Constitutional:  General: She is not in acute distress. Appearance: Normal appearance.  HENT:  Head: Normocephalic and atraumatic.  Right Ear: External ear normal.  Left Ear: External ear normal.  Nose: Nose normal.  Mouth/Throat:  Mouth: Mucous membranes are moist.  Pharynx: Oropharynx is clear.  Eyes:  General: No scleral icterus. Extraocular Movements: Extraocular movements intact.  Conjunctiva/sclera: Conjunctivae normal.  Pupils: Pupils are equal, round, and reactive to light.  Cardiovascular:  Rate and Rhythm: Normal rate and regular rhythm.  Pulses: Normal pulses.  Heart sounds: Normal heart sounds.  Pulmonary:  Effort: Pulmonary effort is normal. No respiratory distress.  Breath sounds: Normal breath sounds.  Abdominal:  General: Bowel sounds are normal.  Palpations: Abdomen is soft.  Tenderness: There is no abdominal tenderness.  Comments: There is some fullness on the left mid abdomen  Musculoskeletal:  General: No swelling, tenderness or deformity. Normal range of motion.   Cervical back: Normal range of motion and neck supple.  Skin: General: Skin is warm and dry.  Coloration: Skin is not jaundiced.  Neurological:  General: No focal deficit present.  Mental Status: She is alert and oriented to person, place, and time.  Psychiatric:  Mood and Affect: Mood normal.  Behavior: Behavior normal.     Labs, Imaging and Diagnostic Testing:  Assessment and Plan:   Diagnoses and all orders for this visit:  Ventral hernia without obstruction or gangrene - [facility] Case Posting Request; Future   The patient appears to have a nonobstructed ventral hernia. Because of the risk of incarceration and strangulation I feel she would benefit from having this fixed. She would also like to have this done. I have discussed with her in detail the risks and benefits of the operation to fix the hernia as well as some of the technical aspects including the risk of chronic pain and the use of mesh and she understands and wishes to proceed. We will move forward with surgical scheduling. I think she would be a good candidate for a laparoscopic assisted type of repair  "

## 2024-05-12 NOTE — Anesthesia Procedure Notes (Signed)
 Procedure Name: Intubation Date/Time: 05/12/2024 7:35 AM  Performed by: Jerl Donald LABOR, CRNAPre-anesthesia Checklist: Patient identified, Emergency Drugs available, Suction available and Patient being monitored Patient Re-evaluated:Patient Re-evaluated prior to induction Oxygen Delivery Method: Circle System Utilized Preoxygenation: Pre-oxygenation with 100% oxygen Induction Type: IV induction Ventilation: Mask ventilation without difficulty Laryngoscope Size: Mac and 3 Grade View: Grade I Tube type: Oral Tube size: 7.0 mm Number of attempts: 1 Airway Equipment and Method: Stylet Placement Confirmation: ETT inserted through vocal cords under direct vision, positive ETCO2 and breath sounds checked- equal and bilateral Secured at: 22 cm Tube secured with: Tape Dental Injury: Teeth and Oropharynx as per pre-operative assessment

## 2024-05-12 NOTE — Transfer of Care (Signed)
 Immediate Anesthesia Transfer of Care Note  Patient: Amanda Eaton  Procedure(s) Performed: REPAIR, HERNIA, VENTRAL, LAPAROSCOPIC  Patient Location: PACU  Anesthesia Type:General  Level of Consciousness: drowsy  Airway & Oxygen Therapy: Patient connected to nasal cannula oxygen  Post-op Assessment: Report given to RN, Post -op Vital signs reviewed and stable, and Patient moving all extremities  Post vital signs: Reviewed and stable  Last Vitals:  Vitals Value Taken Time  BP 156/84 05/12/24 09:19  Temp    Pulse 78 05/12/24 09:21  Resp 17 05/12/24 09:21  SpO2 95 % 05/12/24 09:21  Vitals shown include unfiled device data.  Last Pain:  Vitals:   05/12/24 0639  TempSrc:   PainSc: 0-No pain         Complications: No notable events documented.

## 2024-05-12 NOTE — Interval H&P Note (Signed)
 History and Physical Interval Note:  05/12/2024 7:10 AM  Amanda Eaton  has presented today for surgery, with the diagnosis of VENTRAL HERNIA.  The various methods of treatment have been discussed with the patient and family. After consideration of risks, benefits and other options for treatment, the patient has consented to  Procedures with comments: REPAIR, HERNIA, VENTRAL, LAPAROSCOPIC (N/A) - LAPAROSCOPIC ASSISTED VENTRAL HERNIA REPAIR WITH MESH as a surgical intervention.  The patient's history has been reviewed, patient examined, no change in status, stable for surgery.  I have reviewed the patient's chart and labs.  Questions were answered to the patient's satisfaction.     Deward Null III

## 2024-05-12 NOTE — Anesthesia Postprocedure Evaluation (Addendum)
"   Anesthesia Post Note  Patient: Amanda Eaton  Procedure(s) Performed: REPAIR, HERNIA, VENTRAL, LAPAROSCOPIC     Patient location during evaluation: PACU Anesthesia Type: General Level of consciousness: awake and alert, oriented and patient cooperative Pain management: pain level controlled Vital Signs Assessment: post-procedure vital signs reviewed and stable Respiratory status: spontaneous breathing, nonlabored ventilation and respiratory function stable Cardiovascular status: blood pressure returned to baseline and stable Postop Assessment: able to ambulate (nausea relieved) Anesthetic complications: no   No notable events documented.  Last Vitals:  Vitals:   05/12/24 0945 05/12/24 1000  BP: 113/66 (!) 113/58  Pulse: 62 61  Resp: 17 18  Temp:  36.6 C  SpO2: 95% 95%    Last Pain:  Vitals:   05/12/24 1025  TempSrc:   PainSc: 4                  Taneasha Fuqua,E. Dajahnae Vondra      "

## 2024-05-13 ENCOUNTER — Encounter (HOSPITAL_COMMUNITY): Payer: Self-pay | Admitting: General Surgery

## 2024-05-14 LAB — SURGICAL PATHOLOGY

## 2024-05-18 ENCOUNTER — Telehealth: Payer: Self-pay | Admitting: General Surgery

## 2024-05-18 NOTE — Telephone Encounter (Signed)
 Copy of telephone encounter documented in [facility] just now  Returned pt's husband call. Spoke with them twice over a period of 1.5hrs; s/p lap ventral hernia repair by Dr Curvin this past Monday; reports bloating/distension, burp-ing/some flatus; no fever/chills, small BM, small amount of white phlegm emesis. Po intake was improving over the past few days but not hungry today; some bruising around incision; they want to avoid getting out due to the weather. Discussed sounds like pt may have ileus; rec no solid food to-day; clears to sips of clears; however gave husband and wife strict precautions that if have worsening pain, fever, persistent/wors-ening nausea, vomiting, inability to keep liquids down that she must be evaluated in ED. On 2nd phone call-they asked if she should take her nausea pill and oxycodone . Advised could take nausea pill; try to avoid oxycodone . Use tylenol  instead and again went over with them symptoms prompting ED evaluation.

## 2024-05-19 ENCOUNTER — Emergency Department (HOSPITAL_COMMUNITY)

## 2024-05-19 ENCOUNTER — Other Ambulatory Visit: Payer: Self-pay

## 2024-05-19 ENCOUNTER — Encounter (HOSPITAL_COMMUNITY): Payer: Self-pay | Admitting: Emergency Medicine

## 2024-05-19 ENCOUNTER — Inpatient Hospital Stay (HOSPITAL_COMMUNITY)
Admission: EM | Admit: 2024-05-19 | Discharge: 2024-05-26 | Disposition: A | Attending: General Surgery | Admitting: General Surgery

## 2024-05-19 DIAGNOSIS — E860 Dehydration: Principal | ICD-10-CM

## 2024-05-19 DIAGNOSIS — K567 Ileus, unspecified: Secondary | ICD-10-CM | POA: Diagnosis present

## 2024-05-19 DIAGNOSIS — K56609 Unspecified intestinal obstruction, unspecified as to partial versus complete obstruction: Secondary | ICD-10-CM

## 2024-05-19 LAB — CBC
HCT: 41 % (ref 36.0–46.0)
Hemoglobin: 14.3 g/dL (ref 12.0–15.0)
MCH: 29.9 pg (ref 26.0–34.0)
MCHC: 34.9 g/dL (ref 30.0–36.0)
MCV: 85.8 fL (ref 80.0–100.0)
Platelets: 267 10*3/uL (ref 150–400)
RBC: 4.78 MIL/uL (ref 3.87–5.11)
RDW: 13.2 % (ref 11.5–15.5)
WBC: 12.2 10*3/uL — ABNORMAL HIGH (ref 4.0–10.5)
nRBC: 0 % (ref 0.0–0.2)

## 2024-05-19 LAB — COMPREHENSIVE METABOLIC PANEL WITH GFR
ALT: 14 U/L (ref 0–44)
AST: 15 U/L (ref 15–41)
Albumin: 4.1 g/dL (ref 3.5–5.0)
Alkaline Phosphatase: 59 U/L (ref 38–126)
Anion gap: 21 — ABNORMAL HIGH (ref 5–15)
BUN: 54 mg/dL — ABNORMAL HIGH (ref 8–23)
CO2: 21 mmol/L — ABNORMAL LOW (ref 22–32)
Calcium: 10.1 mg/dL (ref 8.9–10.3)
Chloride: 97 mmol/L — ABNORMAL LOW (ref 98–111)
Creatinine, Ser: 1.7 mg/dL — ABNORMAL HIGH (ref 0.44–1.00)
GFR, Estimated: 32 mL/min — ABNORMAL LOW
Glucose, Bld: 185 mg/dL — ABNORMAL HIGH (ref 70–99)
Potassium: 3.3 mmol/L — ABNORMAL LOW (ref 3.5–5.1)
Sodium: 139 mmol/L (ref 135–145)
Total Bilirubin: 0.9 mg/dL (ref 0.0–1.2)
Total Protein: 7.3 g/dL (ref 6.5–8.1)

## 2024-05-19 LAB — URINALYSIS, ROUTINE W REFLEX MICROSCOPIC
Bilirubin Urine: NEGATIVE
Glucose, UA: 50 mg/dL — AB
Ketones, ur: 5 mg/dL — AB
Nitrite: NEGATIVE
Protein, ur: 100 mg/dL — AB
Specific Gravity, Urine: 1.018 (ref 1.005–1.030)
pH: 5 (ref 5.0–8.0)

## 2024-05-19 LAB — CBG MONITORING, ED: Glucose-Capillary: 122 mg/dL — ABNORMAL HIGH (ref 70–99)

## 2024-05-19 LAB — LIPASE, BLOOD: Lipase: 29 U/L (ref 11–51)

## 2024-05-19 LAB — HIV ANTIBODY (ROUTINE TESTING W REFLEX): HIV Screen 4th Generation wRfx: NONREACTIVE

## 2024-05-19 MED ORDER — METHOCARBAMOL 500 MG PO TABS: 500.0000 mg | ORAL_TABLET | Freq: Three times a day (TID) | ORAL | Status: DC | PRN

## 2024-05-19 MED ORDER — ACETAMINOPHEN 500 MG PO TABS
1000.0000 mg | ORAL_TABLET | Freq: Four times a day (QID) | ORAL | Status: DC
  Administered 2024-05-19 – 2024-05-24 (×9): 1000 mg via ORAL
  Filled 2024-05-19 (×16): qty 2

## 2024-05-19 MED ORDER — METHOCARBAMOL 1000 MG/10ML IJ SOLN: 500.0000 mg | Freq: Three times a day (TID) | INTRAMUSCULAR | Status: DC | PRN

## 2024-05-19 MED ORDER — ONDANSETRON HCL 4 MG/2ML IJ SOLN
4.0000 mg | Freq: Four times a day (QID) | INTRAMUSCULAR | Status: DC | PRN
  Administered 2024-05-19: 4 mg via INTRAVENOUS
  Filled 2024-05-19: qty 2

## 2024-05-19 MED ORDER — POTASSIUM CHLORIDE 10 MEQ/100ML IV SOLN
10.0000 meq | INTRAVENOUS | Status: AC
  Administered 2024-05-19: 10 meq via INTRAVENOUS
  Filled 2024-05-19: qty 100

## 2024-05-19 MED ORDER — LISINOPRIL 10 MG PO TABS: 10.0000 mg | ORAL_TABLET | Freq: Every day | ORAL | Status: DC

## 2024-05-19 MED ORDER — LACTATED RINGERS IV SOLN: INTRAVENOUS | Status: DC

## 2024-05-19 MED ORDER — MORPHINE SULFATE (PF) 2 MG/ML IV SOLN: 1.0000 mg | INTRAVENOUS | Status: DC | PRN

## 2024-05-19 MED ORDER — HYDROCHLOROTHIAZIDE 12.5 MG PO TABS: 12.5000 mg | ORAL_TABLET | Freq: Every day | ORAL | Status: DC

## 2024-05-19 MED ORDER — SIMETHICONE 80 MG PO CHEW
40.0000 mg | CHEWABLE_TABLET | Freq: Four times a day (QID) | ORAL | Status: DC | PRN
  Administered 2024-05-20 – 2024-05-22 (×6): 40 mg via ORAL
  Filled 2024-05-19 (×6): qty 1

## 2024-05-19 MED ORDER — OXYCODONE HCL 5 MG PO TABS: 5.0000 mg | ORAL_TABLET | ORAL | Status: DC | PRN

## 2024-05-19 MED ORDER — DIPHENHYDRAMINE HCL 12.5 MG/5ML PO ELIX: 12.5000 mg | ORAL_SOLUTION | Freq: Four times a day (QID) | ORAL | Status: DC | PRN

## 2024-05-19 MED ORDER — MELATONIN 3 MG PO TABS: 3.0000 mg | ORAL_TABLET | Freq: Every evening | ORAL | Status: DC | PRN

## 2024-05-19 MED ORDER — METOPROLOL TARTRATE 5 MG/5ML IV SOLN: 5.0000 mg | Freq: Four times a day (QID) | INTRAVENOUS | Status: DC | PRN

## 2024-05-19 MED ORDER — LACTATED RINGERS IV BOLUS
1000.0000 mL | Freq: Once | INTRAVENOUS | Status: AC
  Administered 2024-05-19: 1000 mL via INTRAVENOUS

## 2024-05-19 MED ORDER — ONDANSETRON 4 MG PO TBDP: 4.0000 mg | ORAL_TABLET | Freq: Four times a day (QID) | ORAL | Status: DC | PRN

## 2024-05-19 MED ORDER — HEPARIN SODIUM (PORCINE) 5000 UNIT/ML IJ SOLN
5000.0000 [IU] | Freq: Three times a day (TID) | INTRAMUSCULAR | Status: DC
  Administered 2024-05-19 – 2024-05-26 (×20): 5000 [IU] via SUBCUTANEOUS
  Filled 2024-05-19 (×20): qty 1

## 2024-05-19 MED ORDER — DIPHENHYDRAMINE HCL 50 MG/ML IJ SOLN: 12.5000 mg | Freq: Four times a day (QID) | INTRAMUSCULAR | Status: DC | PRN

## 2024-05-19 MED ORDER — ONDANSETRON HCL 4 MG/2ML IJ SOLN
4.0000 mg | Freq: Once | INTRAMUSCULAR | Status: AC
  Administered 2024-05-19: 4 mg via INTRAVENOUS
  Filled 2024-05-19: qty 2

## 2024-05-19 MED ORDER — INSULIN ASPART 100 UNIT/ML IJ SOLN
0.0000 [IU] | Freq: Three times a day (TID) | INTRAMUSCULAR | Status: DC
  Administered 2024-05-19 – 2024-05-20 (×4): 2 [IU] via SUBCUTANEOUS
  Administered 2024-05-21 (×2): 3 [IU] via SUBCUTANEOUS
  Administered 2024-05-22: 2 [IU] via SUBCUTANEOUS
  Administered 2024-05-22: 3 [IU] via SUBCUTANEOUS
  Administered 2024-05-23: 2 [IU] via SUBCUTANEOUS
  Administered 2024-05-23 – 2024-05-24 (×3): 3 [IU] via SUBCUTANEOUS
  Administered 2024-05-24: 5 [IU] via SUBCUTANEOUS
  Administered 2024-05-25 – 2024-05-26 (×4): 2 [IU] via SUBCUTANEOUS
  Filled 2024-05-19 (×2): qty 2
  Filled 2024-05-19 (×2): qty 3
  Filled 2024-05-19 (×3): qty 2
  Filled 2024-05-19: qty 3
  Filled 2024-05-19: qty 2
  Filled 2024-05-19: qty 3
  Filled 2024-05-19: qty 5
  Filled 2024-05-19 (×3): qty 2
  Filled 2024-05-19: qty 3
  Filled 2024-05-19: qty 2
  Filled 2024-05-19: qty 3

## 2024-05-19 MED ORDER — LISINOPRIL-HYDROCHLOROTHIAZIDE 10-12.5 MG PO TABS: 0.5000 | ORAL_TABLET | Freq: Every day | ORAL | Status: DC

## 2024-05-19 MED ORDER — HYDROMORPHONE HCL 1 MG/ML IJ SOLN
0.5000 mg | Freq: Once | INTRAMUSCULAR | Status: AC
  Administered 2024-05-19: 0.5 mg via INTRAVENOUS
  Filled 2024-05-19: qty 1

## 2024-05-19 NOTE — ED Notes (Signed)
 Pt complains of feeling dizzy and flushed, also feels like BP is elevated. MD notified and spoken to on the phone, states she will put in new orders for medications.

## 2024-05-19 NOTE — H&P (Addendum)
 "    Amanda Eaton 1955/04/27  [id number].    Chief Complaint/Reason for Consult: N/V, abdominal pain s/p VHR  HPI:  This is a 69 yo female who underwent a lap assisted VHR with mesh by Dr. Curvin on 05/12/2024.  She tolerated this well, but called our office last night due to bloating and distention with some burping.  She reports nausea and small volume emesis since POD#1. She report some flatus on Wednesday 1/21, but mostly bleching. Had 2 small BMs Friday and one Saturday morning. But has not had a BM Or passed gas since that time. Denies fevers, chills. Discussion was had at that time with the surgeon about signs and symptoms for coming to the ED. Due to ongoing PO intolerance she came to the ED. She has been found to have a Cr of 1.7, K 3.3, WBC 12 and a CT scan concerning for a dilated loop of small bowel with luminal narrowing in the LLQ adjacent to the surgical site, suspicious for a bowel obstruction.  We have been asked to see her.  ROS: ROS: see HPI  Family History  Problem Relation Age of Onset   Diabetes Mother    Hyperlipidemia Mother    Hypertension Mother    COPD Father    Lung cancer Brother        lung   Diabetes Maternal Grandfather    Cancer Daughter    Colon cancer Neg Hx     Past Medical History:  Diagnosis Date   DIABETES MELLITUS, TYPE II 11/23/2006   HYPERLIPIDEMIA, BORDERLINE 04/08/2008   HYPERTENSION 11/23/2006   Lyme disease    WEIGHT GAIN 10/12/2008    Past Surgical History:  Procedure Laterality Date   ANTERIOR AND POSTERIOR REPAIR N/A 07/17/2012   Procedure: ANTERIOR (CYSTOCELE) AND POSTERIOR REPAIR (RECTOCELE);  Surgeon: Marie-Lyne Lavoie, MD;  Location: [facility];  Service: Gynecology;  Laterality: N/A;   CATARACT EXTRACTION  04/24/2013   HYSTEROSCOPY W/ ENDOMETRIAL ABLATION     1987   MYOMECTOMY VAGINAL APPROACH     ROBOTIC ASSISTED TOTAL HYSTERECTOMY N/A 07/17/2012   Procedure: ROBOTIC ASSISTED TOTAL HYSTERECTOMY with Utero-sacral Ligament  Suspension;  Surgeon: Marie-Lyne Lavoie, MD;  Location: [facility];  Service: Gynecology;  Laterality: N/A;   VENTRAL HERNIA REPAIR N/A 05/12/2024   Procedure: REPAIR, HERNIA, VENTRAL, LAPAROSCOPIC;  Surgeon: Curvin Deward MOULD, MD;  Location: [facility];  Service: General;  Laterality: N/A;  LAPAROSCOPIC ASSISTED VENTRAL HERNIA REPAIR WITH MESH    Social History:  reports that she has never smoked. She has never used smokeless tobacco. She reports that she does not drink alcohol and does not use drugs.  Allergies: Allergies[1]  (Not in a hospital admission)    Physical Exam: Blood pressure 101/63, pulse 85, temperature 98.6 F (37 C), temperature source Oral, resp. rate 15, SpO2 100%.  General: pleasant, WD, WN white female who is laying in bed in NAD HEENT: head is normocephalic, atraumatic.  Sclera are noninjected.  PERRL. Mouth is dry Heart: regular, rate, and rhythm.  Lungs:  Respiratory effort nonlabored Abd: soft, mild distention, overall non-tender, incisions c/d/I with some ecchymosis around LLQ incision. No hernias. No peritonitis.  MS: all 4 extremities are symmetrical with no cyanosis, clubbing, or edema. Skin: warm and dry with no masses, lesions, or rashes Neuro: Cranial nerves 2-12 grossly intact, sensation is normal throughout Psych: A&Ox3 with an appropriate affect.   Results for orders placed or performed during the hospital encounter of 05/19/24 (from the past 48  hours)  Lipase, blood     Status: None   Collection Time: 05/19/24 12:22 PM  Result Value Ref Range   Lipase 29 11 - 51 U/L    Comment: Performed at [facility], [address] [place], [place] [address]  Comprehensive metabolic panel     Status: Abnormal   Collection Time: 05/19/24 12:22 PM  Result Value Ref Range   Sodium 139 135 - 145 mmol/L    Comment: Electrolytes repeated to verify    Potassium 3.3 (L) 3.5 - 5.1 mmol/L   Chloride 97 (L) 98 - 111 mmol/L   CO2 21 (L) 22 - 32 mmol/L    Glucose, Bld 185 (H) 70 - 99 mg/dL    Comment: Glucose reference range applies only to samples taken after fasting for at least 8 hours.   BUN 54 (H) 8 - 23 mg/dL   Creatinine, Ser 8.29 (H) 0.44 - 1.00 mg/dL   Calcium 89.8 8.9 - 89.6 mg/dL   Total Protein 7.3 6.5 - 8.1 g/dL   Albumin 4.1 3.5 - 5.0 g/dL   AST 15 15 - 41 U/L   ALT 14 0 - 44 U/L   Alkaline Phosphatase 59 38 - 126 U/L   Total Bilirubin 0.9 0.0 - 1.2 mg/dL   GFR, Estimated 32 (L) >60 mL/min    Comment: (NOTE) Calculated using the CKD-EPI Creatinine Equation (2021)    Anion gap 21 (H) 5 - 15    Comment: Performed at [facility], [address] [place], [place] [address]  CBC     Status: Abnormal   Collection Time: 05/19/24 12:22 PM  Result Value Ref Range   WBC 12.2 (H) 4.0 - 10.5 K/uL   RBC 4.78 3.87 - 5.11 MIL/uL   Hemoglobin 14.3 12.0 - 15.0 g/dL   HCT 58.9 63.9 - 53.9 %   MCV 85.8 80.0 - 100.0 fL   MCH 29.9 26.0 - 34.0 pg   MCHC 34.9 30.0 - 36.0 g/dL   RDW 86.7 88.4 - 84.4 %   Platelets 267 150 - 400 K/uL   nRBC 0.0 0.0 - 0.2 %    Comment: Performed at [facility], [address], [place], [place] [address]  Urinalysis, Routine w reflex microscopic -Urine, Clean Catch     Status: Abnormal   Collection Time: 05/19/24 12:22 PM  Result Value Ref Range   Color, Urine YELLOW YELLOW   APPearance CLOUDY (A) CLEAR   Specific Gravity, Urine 1.018 1.005 - 1.030   pH 5.0 5.0 - 8.0   Glucose, UA 50 (A) NEGATIVE mg/dL   Hgb urine dipstick SMALL (A) NEGATIVE   Bilirubin Urine NEGATIVE NEGATIVE   Ketones, ur 5 (A) NEGATIVE mg/dL   Protein, ur 899 (A) NEGATIVE mg/dL   Nitrite NEGATIVE NEGATIVE   Leukocytes,Ua SMALL (A) NEGATIVE   RBC / HPF 6-10 0 - 5 RBC/hpf   WBC, UA 21-50 0 - 5 WBC/hpf   Bacteria, UA RARE (A) NONE SEEN   Squamous Epithelial / HPF 6-10 0 - 5 /HPF   WBC Clumps PRESENT    Mucus PRESENT    Hyaline Casts, UA PRESENT     Comment: Performed at [facility], [address], [place], [place] [address]   CT ABDOMEN PELVIS WO CONTRAST Result Date: 05/19/2024 CLINICAL DATA:  Several day history of constipation and vomiting EXAM: CT ABDOMEN AND PELVIS WITHOUT CONTRAST TECHNIQUE: Multidetector CT imaging of the abdomen and pelvis  was performed following the standard protocol without IV contrast. RADIATION DOSE REDUCTION: This exam was performed according to the departmental dose-optimization program which includes automated exposure control, adjustment of the mA and/or kV according to patient size and/or use of iterative reconstruction technique. COMPARISON:  CT abdomen and pelvis dated 03/18/2024 FINDINGS: Lower chest: No focal consolidation or pulmonary nodule in the lung bases. No pleural effusion or pneumothorax demonstrated. Partially imaged heart size is normal. Coronary artery calcification. Hepatobiliary: Multifocal hepatic cysts measuring up to 2.4 cm in peripheral segment 6/7 (2:24). No intra or extrahepatic biliary ductal dilation. Normal gallbladder. Pancreas: No focal lesions or main ductal dilation. Spleen: Normal in size without focal abnormality. Adrenals/Urinary Tract: No adrenal nodules. No suspicious renal mass, calculi or hydronephrosis. No focal bladder wall thickening. Stomach/Bowel: Normal appearance of the stomach. No abnormal mural thickening. Single loop of dilated small bowel with luminal narrowing in the anterior left lower quadrant (2:61, 8:58). Remainder of small bowel is decompressed. Small volume stool within the ascending colon. Colon is otherwise diffusely underdistended. Colonic diverticulosis without acute diverticulitis. Normal appendix. Vascular/Lymphatic: Aortic atherosclerosis. No enlarged abdominal or pelvic lymph nodes. Reproductive: No adnexal masses. Other: Punctate foci of gas along the left anterior peritoneum and abdominal wall. Diffuse mesenteric stranding and irregular density along the anterior left  lower quadrant. No fluid collection. Musculoskeletal: No acute or abnormal lytic or blastic osseous lesions. Subcutaneous soft tissue stranding and irregular density in the left lower quadrant abdominal wall. IMPRESSION: 1. Postsurgical changes of left lower quadrant ventral abdominal hernia repair. Dilated loop of small bowel with luminal narrowing in the anterior left lower quadrant, adjacent to the surgical site, suspicious for small bowel obstruction. 2.  Aortic Atherosclerosis (ICD10-I70.0). Electronically Signed   By: Limin  Xu M.D.   On: 05/19/2024 15:04      Assessment/Plan POD 7, s/p lap assisted VHR with mesh by Dr. Curvin, 1/19 now with likely post op ileus and mild AKI secondary to dehydration We will plan to admit the patient for IVF resuscitation and management of a post op ileus.  If she continues to have emesis, she may require an NGT, but will hold for now.  Encourage mobilization.  Replace K.  AKI - suspect pre-renal in the setting of dehydration. Creatinine 1.7. Hold home lisinopril . IVF  HTN DM -SSI   FEN - NPO/IVFs, replace K VTE - SQ heparin  ID - none currently needed Admit - obs, med-surg  I reviewed nursing notes, ED provider notes, last 24 h vitals and pain scores, last 48 h intake and output, last 24 h labs and trends, and last 24 h imaging results.  Almarie Pringle, [facility] Surgery 05/19/2024, 3:55 PM Please see Amion for pager number during day hours 7:00am-4:30pm or 7:00am -11:30am on weekends      [1] No Known Allergies  "

## 2024-05-19 NOTE — ED Triage Notes (Signed)
 Pt arriving POV with emesis for several days. Pt reports having hernia surgery last Monday and has been having abdominal pain and emesis since.

## 2024-05-19 NOTE — ED Provider Notes (Signed)
 " [place] EMERGENCY DEPARTMENT AT [facility] Provider Note   CSN: [id number] Arrival date & time: 05/19/24  1056     Patient presents with: Emesis   Amanda Eaton is a 69 y.o. female.   HPI Patient reports she had ventral hernia surgery repair 1 week ago done by Dr. Curvin.  She reports after the surgery she was having some difficulty.  She was having pain and nausea and a lot of trouble eating.  She did have some episodes of vomiting.  She reports however about 2 days ago she thought she had gotten a little better and was able to eat a little bit of chicken and Jell-O.  Today however pain has gotten worse again and she started having vomiting again.  They called the surgical office at [facility] surgery and were advised to come to the emergency department for further evaluation.    Prior to Admission medications  Medication Sig Start Date End Date Taking? Authorizing Provider  ibuprofen  (ADVIL ,MOTRIN ) 200 MG tablet Take 400 mg by mouth every 6 (six) hours as needed for moderate pain.   Yes [provider]  lisinopril -hydrochlorothiazide  (ZESTORETIC ) 10-12.5 MG tablet TAKE 1/2 TABLET BY MOUTH DAILY 12/12/23  Yes Nafziger, Darleene, NP  Multiple Vitamins-Minerals (WOMENS MULTI VITAMIN & MINERAL PO) Take 1 tablet by mouth 2 (two) times daily.   Yes [provider]  oxyCODONE  (ROXICODONE ) 5 MG immediate release tablet Take 1 tablet (5 mg total) by mouth every 8 (eight) hours as needed. 05/12/24 05/12/25 Yes Curvin Mt III, MD  simvastatin  (ZOCOR ) 20 MG tablet Take 1 tablet (20 mg total) by mouth at bedtime. 03/14/24  Yes Nafziger, Cory, NP  SitaGLIPtin -MetFORMIN  HCl (JANUMET  XR) [phone] MG TB24 Take 1 tablet by mouth daily. 03/13/24  Yes Nafziger, Darleene, NP  TURMERIC PO Take 3 tablets by mouth. Takes 2 in the am & 1 in the afternoon   Yes [provider]    Allergies: Patient has no known allergies.    Review of Systems  Updated Vital Signs BP  101/63 (BP Location: Left Arm)   Pulse 85   Temp 98.6 F (37 C) (Oral)   Resp 15   SpO2 100%   Physical Exam Constitutional:      Comments: Alert nontoxic  HENT:     Mouth/Throat:     Mouth: Mucous membranes are dry.     Pharynx: Oropharynx is clear.  Cardiovascular:     Rate and Rhythm: Normal rate and regular rhythm.  Pulmonary:     Effort: Pulmonary effort is normal.     Breath sounds: Normal breath sounds.  Abdominal:     Comments: Surgical wounds clean dry and intact.  Some ecchymosis surrounding the left lower quadrant incision but no erythema or palpable fluctuance.  Abdomen is soft without guarding.  Mild diffuse lower and left tenderness  Musculoskeletal:        General: No swelling or tenderness. Normal range of motion.     Right lower leg: No edema.     Left lower leg: No edema.  Skin:    General: Skin is warm and dry.  Neurological:     General: No focal deficit present.     Mental Status: She is oriented to person, place, and time.     Motor: No weakness.     Coordination: Coordination normal.  Psychiatric:        Mood and Affect: Mood normal.     (all labs ordered are listed,  but only abnormal results are displayed) Labs Reviewed  COMPREHENSIVE METABOLIC PANEL WITH GFR - Abnormal; Notable for the following components:      Result Value   Potassium 3.3 (*)    Chloride 97 (*)    CO2 21 (*)    Glucose, Bld 185 (*)    BUN 54 (*)    Creatinine, Ser 1.70 (*)    GFR, Estimated 32 (*)    Anion gap 21 (*)    All other components within normal limits  CBC - Abnormal; Notable for the following components:   WBC 12.2 (*)    All other components within normal limits  URINALYSIS, ROUTINE W REFLEX MICROSCOPIC - Abnormal; Notable for the following components:   APPearance CLOUDY (*)    Glucose, UA 50 (*)    Hgb urine dipstick SMALL (*)    Ketones, ur 5 (*)    Protein, ur 100 (*)    Leukocytes,Ua SMALL (*)    Bacteria, UA RARE (*)    All other components  within normal limits  LIPASE, BLOOD    EKG: None  Radiology: CT ABDOMEN PELVIS WO CONTRAST Result Date: 05/19/2024 CLINICAL DATA:  Several day history of constipation and vomiting EXAM: CT ABDOMEN AND PELVIS WITHOUT CONTRAST TECHNIQUE: Multidetector CT imaging of the abdomen and pelvis was performed following the standard protocol without IV contrast. RADIATION DOSE REDUCTION: This exam was performed according to the departmental dose-optimization program which includes automated exposure control, adjustment of the mA and/or kV according to patient size and/or use of iterative reconstruction technique. COMPARISON:  CT abdomen and pelvis dated 03/18/2024 FINDINGS: Lower chest: No focal consolidation or pulmonary nodule in the lung bases. No pleural effusion or pneumothorax demonstrated. Partially imaged heart size is normal. Coronary artery calcification. Hepatobiliary: Multifocal hepatic cysts measuring up to 2.4 cm in peripheral segment 6/7 (2:24). No intra or extrahepatic biliary ductal dilation. Normal gallbladder. Pancreas: No focal lesions or main ductal dilation. Spleen: Normal in size without focal abnormality. Adrenals/Urinary Tract: No adrenal nodules. No suspicious renal mass, calculi or hydronephrosis. No focal bladder wall thickening. Stomach/Bowel: Normal appearance of the stomach. No abnormal mural thickening. Single loop of dilated small bowel with luminal narrowing in the anterior left lower quadrant (2:61, 8:58). Remainder of small bowel is decompressed. Small volume stool within the ascending colon. Colon is otherwise diffusely underdistended. Colonic diverticulosis without acute diverticulitis. Normal appendix. Vascular/Lymphatic: Aortic atherosclerosis. No enlarged abdominal or pelvic lymph nodes. Reproductive: No adnexal masses. Other: Punctate foci of gas along the left anterior peritoneum and abdominal wall. Diffuse mesenteric stranding and irregular density along the anterior left  lower quadrant. No fluid collection. Musculoskeletal: No acute or abnormal lytic or blastic osseous lesions. Subcutaneous soft tissue stranding and irregular density in the left lower quadrant abdominal wall. IMPRESSION: 1. Postsurgical changes of left lower quadrant ventral abdominal hernia repair. Dilated loop of small bowel with luminal narrowing in the anterior left lower quadrant, adjacent to the surgical site, suspicious for small bowel obstruction. 2.  Aortic Atherosclerosis (ICD10-I70.0). Electronically Signed   By: Limin  Xu M.D.   On: 05/19/2024 15:04     Procedures   Medications Ordered in the ED  lactated ringers  infusion ( Intravenous New Bag/Given 05/19/24 1546)  lactated ringers  bolus 1,000 mL (1,000 mLs Intravenous New Bag/Given 05/19/24 1425)  ondansetron  (ZOFRAN ) injection 4 mg (4 mg Intravenous Given 05/19/24 1423)  HYDROmorphone  (DILAUDID ) injection 0.5 mg (0.5 mg Intravenous Given 05/19/24 1424)  Medical Decision Making Amount and/or Complexity of Data Reviewed Labs: ordered. Radiology: ordered.  Risk Prescription drug management.   Presents as outlined.  She has postop 8 days.  She has had significant amount of vomiting and temporarily seem to be doing better but now has worsening pain and again vomiting.  Proceed with lab work and CT imaging.  Will provide IV fluids and Dilaudid  and Zofran  for pain control.  White count 12.2 GFR 32 urinalysis small amount of leuk esterase 21-50 WBC  CT imaging in chart by radiology possible small bowel obstruction  Consult general surgery PA-C [place] for evaluation and anticipated admission in the emergency department.     Final diagnoses:  Dehydration  Small bowel obstruction [facility])    ED Discharge Orders     None          Armenta Canning, MD 05/19/24 1601  "

## 2024-05-20 LAB — CBC
HCT: 38.8 % (ref 36.0–46.0)
Hemoglobin: 13.1 g/dL (ref 12.0–15.0)
MCH: 29.2 pg (ref 26.0–34.0)
MCHC: 33.8 g/dL (ref 30.0–36.0)
MCV: 86.4 fL (ref 80.0–100.0)
Platelets: 244 10*3/uL (ref 150–400)
RBC: 4.49 MIL/uL (ref 3.87–5.11)
RDW: 13.5 % (ref 11.5–15.5)
WBC: 10.9 10*3/uL — ABNORMAL HIGH (ref 4.0–10.5)
nRBC: 0 % (ref 0.0–0.2)

## 2024-05-20 LAB — GLUCOSE, CAPILLARY
Glucose-Capillary: 127 mg/dL — ABNORMAL HIGH (ref 70–99)
Glucose-Capillary: 128 mg/dL — ABNORMAL HIGH (ref 70–99)
Glucose-Capillary: 135 mg/dL — ABNORMAL HIGH (ref 70–99)
Glucose-Capillary: 136 mg/dL — ABNORMAL HIGH (ref 70–99)
Glucose-Capillary: 137 mg/dL — ABNORMAL HIGH (ref 70–99)

## 2024-05-20 LAB — BASIC METABOLIC PANEL WITH GFR
Anion gap: 12 (ref 5–15)
BUN: 55 mg/dL — ABNORMAL HIGH (ref 8–23)
CO2: 29 mmol/L (ref 22–32)
Calcium: 9.8 mg/dL (ref 8.9–10.3)
Chloride: 99 mmol/L (ref 98–111)
Creatinine, Ser: 1.46 mg/dL — ABNORMAL HIGH (ref 0.44–1.00)
GFR, Estimated: 39 mL/min — ABNORMAL LOW
Glucose, Bld: 141 mg/dL — ABNORMAL HIGH (ref 70–99)
Potassium: 2.9 mmol/L — ABNORMAL LOW (ref 3.5–5.1)
Sodium: 140 mmol/L (ref 135–145)

## 2024-05-20 LAB — TROPONIN T, HIGH SENSITIVITY: Troponin T High Sensitivity: 18 ng/L (ref 0–19)

## 2024-05-20 MED ORDER — KCL IN DEXTROSE-NACL 20-5-0.9 MEQ/L-%-% IV SOLN
INTRAVENOUS | Status: AC
  Filled 2024-05-20 (×3): qty 1000

## 2024-05-20 MED ORDER — SODIUM CHLORIDE 0.9 % IV SOLN
12.5000 mg | Freq: Four times a day (QID) | INTRAVENOUS | Status: DC | PRN
  Administered 2024-05-20 – 2024-05-22 (×5): 12.5 mg via INTRAVENOUS
  Filled 2024-05-20: qty 12.5
  Filled 2024-05-20: qty 0.5
  Filled 2024-05-20: qty 12.5
  Filled 2024-05-20 (×2): qty 0.5
  Filled 2024-05-20: qty 12.5

## 2024-05-20 MED ORDER — MORPHINE SULFATE (PF) 2 MG/ML IV SOLN: 1.0000 mg | INTRAVENOUS | Status: DC | PRN

## 2024-05-20 NOTE — TOC Initial Note (Signed)
 Transition of Care [facility]) - Initial/Assessment Note    Patient Details  Name: Amanda Eaton MRN: [id number] Date of Birth: Jan 09, 1956  [facility] CM/SW Contact:    Alfonse JONELLE Rex, RN Phone Number: 05/20/2024, 11:16 AM  Clinical Narrative:    Met with patient and spouse at bedside to introduce role of INPT CM and review for dc planning, pt/spouse confirmed pt has an established PCP, no current home care services or home DME. Patient reports she feels safe returning home with support from her spouse. MOON reviewed, patient gave verbal permission for her spouse to sign the form. MOON completed.                Expected Discharge Plan: Home/Self Care Barriers to Discharge: Continued Medical Work up   Patient Goals and CMS Choice Patient states their goals for this hospitalization and ongoing recovery are:: return home          Expected Discharge Plan and Services       Living arrangements for the past 2 months: Single Family Home                                      Prior Living Arrangements/Services Living arrangements for the past 2 months: Single Family Home Lives with:: Spouse Patient language and need for interpreter reviewed:: Yes Do you feel safe going back to the place where you live?: Yes      Need for Family Participation in Patient Care: Yes (Comment) Care giver support system in place?: Yes (comment)   Criminal Activity/Legal Involvement Pertinent to Current Situation/Hospitalization: No - Comment as needed  Activities of Daily Living   ADL Screening (condition at time of admission) Independently performs ADLs?: Yes (appropriate for developmental age) Is the patient deaf or have difficulty hearing?: No Does the patient have difficulty seeing, even when wearing glasses/contacts?: No Does the patient have difficulty concentrating, remembering, or making decisions?: No  Permission Sought/Granted                  Emotional  Assessment Appearance:: Appears stated age Attitude/Demeanor/Rapport: Engaged Affect (typically observed): Accepting Orientation: : Oriented to Self, Oriented to Place, Oriented to  Time, Oriented to Situation Alcohol / Substance Use: Not Applicable Psych Involvement: No (comment)  Admission diagnosis:  Dehydration [E86.0] Ileus (HCC) [K56.7] Small bowel obstruction (HCC) [K56.609] Patient Active Problem List   Diagnosis Date Noted   Ileus (HCC) 05/19/2024   Acute pain of right knee 03/15/2016   S/P hysterectomy 07/18/2012   WEIGHT GAIN 10/12/2008   Hyperlipidemia 04/08/2008   Diabetes mellitus treated with oral medication (HCC) 11/23/2006   Essential hypertension 11/23/2006   PCP:  Merna Huxley, NP Pharmacy:   [facility] [id number] [place], [place] - [address] [place] [address] Phone: [phone] Fax: [phone]     Social Drivers of Health (SDOH) Social History: SDOH Screenings   Food Insecurity: No Food Insecurity (05/19/2024)  Housing: Low Risk (05/19/2024)  Transportation Needs: No Transportation Needs (05/19/2024)  Utilities: Not At Risk (05/19/2024)  Alcohol Screen: Low Risk (12/09/2021)  Depression (PHQ2-9): Low Risk (12/18/2023)  Financial Resource Strain: Low Risk (12/12/2022)  Physical Activity: Insufficiently Active (12/12/2022)  Social Connections: Unknown (05/19/2024)  Stress: No Stress Concern Present (12/12/2022)  Tobacco Use: Low Risk (05/19/2024)  Health Literacy: Adequate Health Literacy (12/13/2022)   SDOH Interventions:     Readmission Risk  Interventions     No data to display

## 2024-05-20 NOTE — Care Management Obs Status (Signed)
 MEDICARE OBSERVATION STATUS NOTIFICATION   Patient Details  Name: Anet Logsdon MRN: [id number] Date of Birth: [birth year]   Medicare Observation Status Notification Given:  Yes    Alfonse JONELLE Rex, RN 05/20/2024, 10:49 AM

## 2024-05-20 NOTE — Plan of Care (Signed)

## 2024-05-20 NOTE — Plan of Care (Signed)
   Problem: Coping: Goal: Ability to adjust to condition or change in health will improve Outcome: Progressing   Problem: Health Behavior/Discharge Planning: Goal: Ability to manage health-related needs will improve Outcome: Progressing

## 2024-05-20 NOTE — Progress Notes (Signed)
 "    Subjective/Chief Complaint: Complains of chest pressure and nausea   Objective: Vital signs in last 24 hours: Temp:  [97.6 F (36.4 C)-99 F (37.2 C)] 98.4 F (36.9 C) (01/27 0609) Pulse Rate:  [69-85] 75 (01/27 0609) Resp:  [15-18] 18 (01/27 0609) BP: (101-162)/(63-87) 162/87 (01/27 0609) SpO2:  [93 %-100 %] 97 % (01/27 0609) Weight:  [92.1 kg] 92.1 kg (01/26 2350) Last BM Date : 05/17/24  Intake/Output from previous day: 01/26 0701 - 01/27 0700 In: 1532.6 [P.O.:20; I.V.:1416.5; IV Piggyback:96.1] Out: -  Intake/Output this shift: No intake/output data recorded.  General appearance: alert and cooperative Resp: clear to auscultation bilaterally Cardio: regular rate and rhythm GI: soft, minimal tenderness. Not distended. Incision looks good. Good bs  Lab Results:  Recent Labs    05/19/24 1222 05/20/24 0505  WBC 12.2* 10.9*  HGB 14.3 13.1  HCT 41.0 38.8  PLT 267 244   BMET Recent Labs    05/19/24 1222 05/20/24 0505  NA 139 140  K 3.3* 2.9*  CL 97* 99  CO2 21* 29  GLUCOSE 185* 141*  BUN 54* 55*  CREATININE 1.70* 1.46*  CALCIUM 10.1 9.8   PT/INR No results for input(s): LABPROT, INR in the last 72 hours. ABG No results for input(s): PHART, HCO3 in the last 72 hours.  Invalid input(s): PCO2, PO2  Studies/Results: CT ABDOMEN PELVIS WO CONTRAST Result Date: 05/19/2024 CLINICAL DATA:  Several day history of constipation and vomiting EXAM: CT ABDOMEN AND PELVIS WITHOUT CONTRAST TECHNIQUE: Multidetector CT imaging of the abdomen and pelvis was performed following the standard protocol without IV contrast. RADIATION DOSE REDUCTION: This exam was performed according to the departmental dose-optimization program which includes automated exposure control, adjustment of the mA and/or kV according to patient size and/or use of iterative reconstruction technique. COMPARISON:  CT abdomen and pelvis dated 03/18/2024 FINDINGS: Lower chest: No focal  consolidation or pulmonary nodule in the lung bases. No pleural effusion or pneumothorax demonstrated. Partially imaged heart size is normal. Coronary artery calcification. Hepatobiliary: Multifocal hepatic cysts measuring up to 2.4 cm in peripheral segment 6/7 (2:24). No intra or extrahepatic biliary ductal dilation. Normal gallbladder. Pancreas: No focal lesions or main ductal dilation. Spleen: Normal in size without focal abnormality. Adrenals/Urinary Tract: No adrenal nodules. No suspicious renal mass, calculi or hydronephrosis. No focal bladder wall thickening. Stomach/Bowel: Normal appearance of the stomach. No abnormal mural thickening. Single loop of dilated small bowel with luminal narrowing in the anterior left lower quadrant (2:61, 8:58). Remainder of small bowel is decompressed. Small volume stool within the ascending colon. Colon is otherwise diffusely underdistended. Colonic diverticulosis without acute diverticulitis. Normal appendix. Vascular/Lymphatic: Aortic atherosclerosis. No enlarged abdominal or pelvic lymph nodes. Reproductive: No adnexal masses. Other: Punctate foci of gas along the left anterior peritoneum and abdominal wall. Diffuse mesenteric stranding and irregular density along the anterior left lower quadrant. No fluid collection. Musculoskeletal: No acute or abnormal lytic or blastic osseous lesions. Subcutaneous soft tissue stranding and irregular density in the left lower quadrant abdominal wall. IMPRESSION: 1. Postsurgical changes of left lower quadrant ventral abdominal hernia repair. Dilated loop of small bowel with luminal narrowing in the anterior left lower quadrant, adjacent to the surgical site, suspicious for small bowel obstruction. 2.  Aortic Atherosclerosis (ICD10-I70.0). Electronically Signed   By: Limin  Xu M.D.   On: 05/19/2024 15:04    Anti-infectives: Anti-infectives (From admission, onward)    None       Assessment/Plan: s/p * No surgery  found * Ileus  vs sbo POD 7 ventral hernia repair with mesh CT shows normal postop changes with a loop of dilated small bowel near operative site IV hydration. Replace K in IVF Check EKG and troponin because of chest discomfort Monitor closely  LOS: 0 days    Amanda Eaton 05/20/2024  "

## 2024-05-21 ENCOUNTER — Inpatient Hospital Stay (HOSPITAL_COMMUNITY)

## 2024-05-21 LAB — CBC
HCT: 40.3 % (ref 36.0–46.0)
Hemoglobin: 13.1 g/dL (ref 12.0–15.0)
MCH: 28.6 pg (ref 26.0–34.0)
MCHC: 32.5 g/dL (ref 30.0–36.0)
MCV: 88 fL (ref 80.0–100.0)
Platelets: 249 10*3/uL (ref 150–400)
RBC: 4.58 MIL/uL (ref 3.87–5.11)
RDW: 13.3 % (ref 11.5–15.5)
WBC: 7.9 10*3/uL (ref 4.0–10.5)
nRBC: 0 % (ref 0.0–0.2)

## 2024-05-21 LAB — BASIC METABOLIC PANEL WITH GFR
Anion gap: 12 (ref 5–15)
BUN: 27 mg/dL — ABNORMAL HIGH (ref 8–23)
CO2: 29 mmol/L (ref 22–32)
Calcium: 9.2 mg/dL (ref 8.9–10.3)
Chloride: 102 mmol/L (ref 98–111)
Creatinine, Ser: 0.82 mg/dL (ref 0.44–1.00)
GFR, Estimated: >60 mL/min
Glucose, Bld: 176 mg/dL — ABNORMAL HIGH (ref 70–99)
Potassium: 2.8 mmol/L — ABNORMAL LOW (ref 3.5–5.1)
Sodium: 143 mmol/L (ref 135–145)

## 2024-05-21 LAB — GLUCOSE, CAPILLARY
Glucose-Capillary: 151 mg/dL — ABNORMAL HIGH (ref 70–99)
Glucose-Capillary: 118 mg/dL — ABNORMAL HIGH (ref 70–99)
Glucose-Capillary: 153 mg/dL — ABNORMAL HIGH (ref 70–99)
Glucose-Capillary: 126 mg/dL — ABNORMAL HIGH (ref 70–99)

## 2024-05-21 MED ORDER — KCL IN DEXTROSE-NACL 20-5-0.9 MEQ/L-%-% IV SOLN
INTRAVENOUS | Status: DC
  Filled 2024-05-21 (×10): qty 1000

## 2024-05-21 MED ORDER — PANTOPRAZOLE SODIUM 40 MG IV SOLR
40.0000 mg | INTRAVENOUS | Status: DC
  Administered 2024-05-21 – 2024-05-25 (×5): 40 mg via INTRAVENOUS
  Filled 2024-05-21 (×5): qty 10

## 2024-05-21 NOTE — Plan of Care (Signed)
" °  Problem: Nutritional: Goal: Maintenance of adequate nutrition will improve Outcome: Progressing   Problem: Tissue Perfusion: Goal: Adequacy of tissue perfusion will improve Outcome: Progressing   Problem: Health Behavior/Discharge Planning: Goal: Ability to manage health-related needs will improve Outcome: Progressing   Problem: Activity: Goal: Risk for activity intolerance will decrease Outcome: Progressing   "

## 2024-05-21 NOTE — Progress Notes (Signed)
 "    Subjective/Chief Complaint: Still nauseated but had bm and flatus overnight   Objective: Vital signs in last 24 hours: Temp:  [98 F (36.7 C)-98.9 F (37.2 C)] 98.1 F (36.7 C) (01/28 0612) Pulse Rate:  [68-79] 69 (01/28 0612) Resp:  [16-17] 16 (01/28 0612) BP: (150-164)/(85-95) 163/95 (01/28 0612) SpO2:  [93 %-97 %] 94 % (01/28 0612) Last BM Date : 05/21/24  Intake/Output from previous day: 01/27 0701 - 01/28 0700 In: 2398.3 [P.O.:120; I.V.:2178.3; IV Piggyback:100] Out: -  Intake/Output this shift: No intake/output data recorded.  General appearance: alert and cooperative Resp: clear to auscultation bilaterally Cardio: regular rate and rhythm GI: soft, mild focal tenderness at incision. Not distended. Good bs  Lab Results:  Recent Labs    05/20/24 0505 05/21/24 0509  WBC 10.9* 7.9  HGB 13.1 13.1  HCT 38.8 40.3  PLT 244 249   BMET Recent Labs    05/20/24 0505 05/21/24 0509  NA 140 143  K 2.9* 2.8*  CL 99 102  CO2 29 29  GLUCOSE 141* 176*  BUN 55* 27*  CREATININE 1.46* 0.82  CALCIUM 9.8 9.2   PT/INR No results for input(s): LABPROT, INR in the last 72 hours. ABG No results for input(s): PHART, HCO3 in the last 72 hours.  Invalid input(s): PCO2, PO2  Studies/Results: CT ABDOMEN PELVIS WO CONTRAST Result Date: 05/19/2024 CLINICAL DATA:  Several day history of constipation and vomiting EXAM: CT ABDOMEN AND PELVIS WITHOUT CONTRAST TECHNIQUE: Multidetector CT imaging of the abdomen and pelvis was performed following the standard protocol without IV contrast. RADIATION DOSE REDUCTION: This exam was performed according to the departmental dose-optimization program which includes automated exposure control, adjustment of the mA and/or kV according to patient size and/or use of iterative reconstruction technique. COMPARISON:  CT abdomen and pelvis dated 03/18/2024 FINDINGS: Lower chest: No focal consolidation or pulmonary nodule in the lung bases.  No pleural effusion or pneumothorax demonstrated. Partially imaged heart size is normal. Coronary artery calcification. Hepatobiliary: Multifocal hepatic cysts measuring up to 2.4 cm in peripheral segment 6/7 (2:24). No intra or extrahepatic biliary ductal dilation. Normal gallbladder. Pancreas: No focal lesions or main ductal dilation. Spleen: Normal in size without focal abnormality. Adrenals/Urinary Tract: No adrenal nodules. No suspicious renal mass, calculi or hydronephrosis. No focal bladder wall thickening. Stomach/Bowel: Normal appearance of the stomach. No abnormal mural thickening. Single loop of dilated small bowel with luminal narrowing in the anterior left lower quadrant (2:61, 8:58). Remainder of small bowel is decompressed. Small volume stool within the ascending colon. Colon is otherwise diffusely underdistended. Colonic diverticulosis without acute diverticulitis. Normal appendix. Vascular/Lymphatic: Aortic atherosclerosis. No enlarged abdominal or pelvic lymph nodes. Reproductive: No adnexal masses. Other: Punctate foci of gas along the left anterior peritoneum and abdominal wall. Diffuse mesenteric stranding and irregular density along the anterior left lower quadrant. No fluid collection. Musculoskeletal: No acute or abnormal lytic or blastic osseous lesions. Subcutaneous soft tissue stranding and irregular density in the left lower quadrant abdominal wall. IMPRESSION: 1. Postsurgical changes of left lower quadrant ventral abdominal hernia repair. Dilated loop of small bowel with luminal narrowing in the anterior left lower quadrant, adjacent to the surgical site, suspicious for small bowel obstruction. 2.  Aortic Atherosclerosis (ICD10-I70.0). Electronically Signed   By: Limin  Xu M.D.   On: 05/19/2024 15:04    Anti-infectives: Anti-infectives (From admission, onward)    None       Assessment/Plan: s/p * No surgery found * POD 8 ventral hernia  repair with mesh Ileus vs sbo. Slow  improvement IV hydration. Replace K ambulate  LOS: 1 day    Amanda Eaton 05/21/2024  "

## 2024-05-22 LAB — BASIC METABOLIC PANEL WITH GFR
Anion gap: 13 (ref 5–15)
BUN: 19 mg/dL (ref 8–23)
CO2: 29 mmol/L (ref 22–32)
Calcium: 9 mg/dL (ref 8.9–10.3)
Chloride: 101 mmol/L (ref 98–111)
Creatinine, Ser: 0.82 mg/dL (ref 0.44–1.00)
GFR, Estimated: >60 mL/min
Glucose, Bld: 168 mg/dL — ABNORMAL HIGH (ref 70–99)
Potassium: 2.8 mmol/L — ABNORMAL LOW (ref 3.5–5.1)
Sodium: 143 mmol/L (ref 135–145)

## 2024-05-22 LAB — GLUCOSE, CAPILLARY
Glucose-Capillary: 145 mg/dL — ABNORMAL HIGH (ref 70–99)
Glucose-Capillary: 154 mg/dL — ABNORMAL HIGH (ref 70–99)
Glucose-Capillary: 118 mg/dL — ABNORMAL HIGH (ref 70–99)
Glucose-Capillary: 142 mg/dL — ABNORMAL HIGH (ref 70–99)

## 2024-05-22 MED ORDER — BUPIVACAINE LIPOSOME 1.3 % IJ SUSP
INTRAMUSCULAR | Status: AC
  Filled 2024-05-22: qty 20

## 2024-05-22 MED ORDER — CARMEX CLASSIC LIP BALM EX OINT
1.0000 | TOPICAL_OINTMENT | CUTANEOUS | Status: AC | PRN
  Administered 2024-05-22: 1 via TOPICAL
  Filled 2024-05-22: qty 10

## 2024-05-22 NOTE — Plan of Care (Signed)
   Problem: Education: Goal: Ability to describe self-care measures that may prevent or decrease complications (Diabetes Survival Skills Education) will improve Outcome: Progressing Goal: Individualized Educational Video(s) Outcome: Progressing   Problem: Coping: Goal: Ability to adjust to condition or change in health will improve Outcome: Progressing

## 2024-05-22 NOTE — Progress Notes (Signed)
"   ° °  Subjective/Chief Complaint: Feels a little better.  Passing a small amount of flatus and had a small bowel movement.  Nausea is not gone but improving   Objective: Vital signs in last 24 hours: Temp:  [98.1 F (36.7 C)-98.6 F (37 C)] 98.6 F (37 C) (01/29 0519) Pulse Rate:  [68-75] 68 (01/29 0519) Resp:  [16-19] 16 (01/29 0519) BP: (162-166)/(87-96) 162/87 (01/29 0519) SpO2:  [95 %-98 %] 98 % (01/29 0519) Last BM Date : 05/21/24  Intake/Output from previous day: 01/28 0701 - 01/29 0700 In: 1135.7 [P.O.:60; I.V.:1025.7; IV Piggyback:50] Out: -  Intake/Output this shift: No intake/output data recorded.  General appearance: alert and cooperative Resp: clear to auscultation bilaterally Cardio: regular rate and rhythm GI: Soft, minimal tenderness focally in the left lower quadrant.  Good bowel sounds  Lab Results:  Recent Labs    05/20/24 0505 05/21/24 0509  WBC 10.9* 7.9  HGB 13.1 13.1  HCT 38.8 40.3  PLT 244 249   BMET Recent Labs    05/20/24 0505 05/21/24 0509  NA 140 143  K 2.9* 2.8*  CL 99 102  CO2 29 29  GLUCOSE 141* 176*  BUN 55* 27*  CREATININE 1.46* 0.82  CALCIUM 9.8 9.2   PT/INR No results for input(s): LABPROT, INR in the last 72 hours. ABG No results for input(s): PHART, HCO3 in the last 72 hours.  Invalid input(s): PCO2, PO2  Studies/Results: DG Abd 2 Views Result Date: 05/21/2024 EXAM: 2 VIEW XRAY OF THE ABDOMEN 05/21/2024 12:51:49 PM COMPARISON: None available. CLINICAL HISTORY: Small bowel obstruction. FINDINGS: BOWEL: Mild small bowel dilatation in the central abdomen consistent with ileus or early/partial small bowel obstruction. SOFT TISSUES: No abnormal calcifications. BONES: No acute fracture. IMPRESSION: 1. Mild small bowel dilatation in the central abdomen, consistent with ileus or early/partial small bowel obstruction. Electronically signed by: Oneil Devonshire MD 05/21/2024 07:15 PM EST RP Workstation: [id number]     Anti-infectives: Anti-infectives (From admission, onward)    None       Assessment/Plan: s/p * No surgery found * Advance diet.  Allow clears today once nausea resolves POD 9 ventral hernia repair with mesh Ileus vs sbo. Slow improvement IV hydration. Replace K ambulate  LOS: 2 days    Amanda Eaton 05/22/2024  "

## 2024-05-23 LAB — GLUCOSE, CAPILLARY
Glucose-Capillary: 185 mg/dL — ABNORMAL HIGH (ref 70–99)
Glucose-Capillary: 146 mg/dL — ABNORMAL HIGH (ref 70–99)
Glucose-Capillary: 155 mg/dL — ABNORMAL HIGH (ref 70–99)
Glucose-Capillary: 147 mg/dL — ABNORMAL HIGH (ref 70–99)

## 2024-05-23 MED ORDER — POTASSIUM CHLORIDE 10 MEQ/100ML IV SOLN
10.0000 meq | INTRAVENOUS | Status: AC
  Administered 2024-05-23 (×3): 10 meq via INTRAVENOUS
  Filled 2024-05-23: qty 100

## 2024-05-23 NOTE — Plan of Care (Signed)
" °  Problem: Education: Goal: Ability to describe self-care measures that may prevent or decrease complications (Diabetes Survival Skills Education) will improve Outcome: Progressing Goal: Individualized Educational Video(s) Outcome: Progressing   Problem: Coping: Goal: Ability to adjust to condition or change in health will improve Outcome: Progressing   Problem: Fluid Volume: Goal: Ability to maintain a balanced intake and output will improve Outcome: Progressing   Problem: Health Behavior/Discharge Planning: Goal: Ability to identify and utilize available resources and services will improve Outcome: Progressing Goal: Ability to manage health-related needs will improve Outcome: Progressing   Problem: Metabolic: Goal: Ability to maintain appropriate glucose levels will improve Outcome: Progressing   Problem: Nutritional: Goal: Maintenance of adequate nutrition will improve Outcome: Progressing Goal: Progress toward achieving an optimal weight will improve Outcome: Progressing   Problem: Skin Integrity: Goal: Risk for impaired skin integrity will decrease Outcome: Progressing   Problem: Tissue Perfusion: Goal: Adequacy of tissue perfusion will improve Outcome: Progressing   Problem: Education: Goal: Knowledge of General Education information will improve Description: Including pain rating scale, medication(s)/side effects and non-pharmacologic comfort measures Outcome: Progressing   Problem: Health Behavior/Discharge Planning: Goal: Ability to manage health-related needs will improve Outcome: Progressing   Problem: Clinical Measurements: Goal: Ability to maintain clinical measurements within normal limits will improve Outcome: Progressing Goal: Will remain free from infection Outcome: Progressing Goal: Diagnostic test results will improve Outcome: Progressing Goal: Respiratory complications will improve Outcome: Progressing Goal: Cardiovascular complication will  be avoided Outcome: Progressing   Problem: Activity: Goal: Risk for activity intolerance will decrease Outcome: Progressing   Problem: Nutrition: Goal: Adequate nutrition will be maintained Outcome: Progressing   Problem: Coping: Goal: Level of anxiety will decrease Outcome: Progressing   Problem: Elimination: Goal: Will not experience complications related to bowel motility Outcome: Progressing Goal: Will not experience complications related to urinary retention Outcome: Progressing   Problem: Pain Managment: Goal: General experience of comfort will improve and/or be controlled Outcome: Progressing   Problem: Safety: Goal: Ability to remain free from injury will improve Outcome: Progressing   Problem: Skin Integrity: Goal: Risk for impaired skin integrity will decrease Outcome: Progressing   Problem: Activity: Goal: Ability to tolerate increased activity will improve Outcome: Progressing   Problem: Bowel/Gastric: Goal: Gastrointestinal status for postoperative course will improve Outcome: Progressing   Problem: Fluid Volume: Goal: Ability to maintain a balanced intake and output will improve Outcome: Progressing   Problem: Elimination: Goal: Will not experience complications related to bowel motility Outcome: Progressing   Problem: Pain Managment: Goal: General experience of comfort will improve and/or be controlled Outcome: Progressing   "

## 2024-05-23 NOTE — Progress Notes (Signed)
" ° ° °  PROCEDURAL EXPEDITER PROGRESS NOTE  Patient Name: Amanda Eaton  DOB:07/11/1955 Date of Admission: 05/19/2024  Date of Assessment:05/23/24   -------------------------------------------------------------------------------------------------------------------   Brief clinical summary: 69 yr old female, schedule for surgery on1/31/26 for Diagnostic laparoscopy.    Orders in place:  No   Communication with surgical team if no orders: n/a  Labs, test, and orders reviewed: yes  Requires surgical clearance:  No  What type of clearance: n/a  Clearance received: n/a  Barriers noted:n/a   Intervention provided by [facility] team: n/a  Barrier resolved:  not applicable   -------------------------------------------------------------------------------------------------------------------  [employer], Ronal DELENA Bald Please contact us  directly via secure chat (search for [facility]) or by calling us  at [phone] [facility]).  "

## 2024-05-23 NOTE — Plan of Care (Signed)
" °  Problem: Safety: Goal: Ability to remain free from injury will improve Outcome: Progressing   Problem: Activity: Goal: Ability to tolerate increased activity will improve Outcome: Progressing   Problem: Bowel/Gastric: Goal: Gastrointestinal status for postoperative course will improve Outcome: Progressing   "

## 2024-05-23 NOTE — Progress Notes (Signed)
"   ° °  Subjective/Chief Complaint: Still nauseated with dry heaves but passing flatus   Objective: Vital signs in last 24 hours: Temp:  [98 F (36.7 C)-98.6 F (37 C)] 98 F (36.7 C) (01/30 0535) Pulse Rate:  [74-75] 74 (01/30 0535) Resp:  [16-17] 17 (01/30 0535) BP: (149-163)/(88-93) 149/88 (01/30 0535) SpO2:  [97 %-98 %] 97 % (01/30 0535) Last BM Date : 05/20/24  Intake/Output from previous day: 01/29 0701 - 01/30 0700 In: 1786.8 [P.O.:310; I.V.:1376.8; IV Piggyback:100] Out: 0  Intake/Output this shift: No intake/output data recorded.  General appearance: alert and cooperative Resp: clear to auscultation bilaterally Cardio: regular rate and rhythm GI: soft, minimal tenderness. Not distended. Good bs  Lab Results:  Recent Labs    05/21/24 0509  WBC 7.9  HGB 13.1  HCT 40.3  PLT 249   BMET Recent Labs    05/21/24 0509 05/22/24 0447  NA 143 143  K 2.8* 2.8*  CL 102 101  CO2 29 29  GLUCOSE 176* 168*  BUN 27* 19  CREATININE 0.82 0.82  CALCIUM 9.2 9.0   PT/INR No results for input(s): LABPROT, INR in the last 72 hours. ABG No results for input(s): PHART, HCO3 in the last 72 hours.  Invalid input(s): PCO2, PO2  Studies/Results: DG Abd 2 Views Result Date: 05/21/2024 EXAM: 2 VIEW XRAY OF THE ABDOMEN 05/21/2024 12:51:49 PM COMPARISON: None available. CLINICAL HISTORY: Small bowel obstruction. FINDINGS: BOWEL: Mild small bowel dilatation in the central abdomen consistent with ileus or early/partial small bowel obstruction. SOFT TISSUES: No abnormal calcifications. BONES: No acute fracture. IMPRESSION: 1. Mild small bowel dilatation in the central abdomen, consistent with ileus or early/partial small bowel obstruction. Electronically signed by: Oneil Devonshire MD 05/21/2024 07:15 PM EST RP Workstation: [id number]    Anti-infectives: Anti-infectives (From admission, onward)    None       Assessment/Plan: s/p Procedures: LAPAROSCOPY, DIAGNOSTIC  (N/A) Continue to offer clears. Npo after midnight POD 10 ventral hernia repair with mesh Ileus vs sbo. Slow improvement IV hydration. Replace K Ambulate If not better by tomorrow will plan for diagnostic laparoscopy  LOS: 3 days    Amanda Eaton III 05/23/2024  "

## 2024-05-24 ENCOUNTER — Encounter (HOSPITAL_COMMUNITY): Admission: EM | Disposition: A | Payer: Self-pay | Source: Home / Self Care | Attending: General Surgery

## 2024-05-24 ENCOUNTER — Inpatient Hospital Stay (HOSPITAL_COMMUNITY): Admitting: Anesthesiology

## 2024-05-24 LAB — BASIC METABOLIC PANEL WITH GFR
Anion gap: 9 (ref 5–15)
BUN: 16 mg/dL (ref 8–23)
CO2: 27 mmol/L (ref 22–32)
Calcium: 8.5 mg/dL — ABNORMAL LOW (ref 8.9–10.3)
Chloride: 110 mmol/L (ref 98–111)
Creatinine, Ser: 0.76 mg/dL (ref 0.44–1.00)
GFR, Estimated: >60 mL/min
Glucose, Bld: 183 mg/dL — ABNORMAL HIGH (ref 70–99)
Potassium: 3.1 mmol/L — ABNORMAL LOW (ref 3.5–5.1)
Sodium: 145 mmol/L (ref 135–145)

## 2024-05-24 LAB — GLUCOSE, CAPILLARY
Glucose-Capillary: 160 mg/dL — ABNORMAL HIGH (ref 70–99)
Glucose-Capillary: 140 mg/dL — ABNORMAL HIGH (ref 70–99)
Glucose-Capillary: 109 mg/dL — ABNORMAL HIGH (ref 70–99)
Glucose-Capillary: 132 mg/dL — ABNORMAL HIGH (ref 70–99)

## 2024-05-24 LAB — CBC
HCT: 36.9 % (ref 36.0–46.0)
Hemoglobin: 11.4 g/dL — ABNORMAL LOW (ref 12.0–15.0)
MCH: 28 pg (ref 26.0–34.0)
MCHC: 30.9 g/dL (ref 30.0–36.0)
MCV: 90.7 fL (ref 80.0–100.0)
Platelets: 228 10*3/uL (ref 150–400)
RBC: 4.07 MIL/uL (ref 3.87–5.11)
RDW: 13.6 % (ref 11.5–15.5)
WBC: 11 10*3/uL — ABNORMAL HIGH (ref 4.0–10.5)
nRBC: 0 % (ref 0.0–0.2)

## 2024-05-24 MED ORDER — ACETAMINOPHEN 10 MG/ML IV SOLN
INTRAVENOUS | Status: AC
  Filled 2024-05-24: qty 100

## 2024-05-24 MED ORDER — CEFAZOLIN SODIUM-DEXTROSE 2-4 GM/100ML-% IV SOLN
INTRAVENOUS | Status: AC
  Filled 2024-05-24: qty 100

## 2024-05-24 NOTE — Plan of Care (Signed)

## 2024-05-24 NOTE — Plan of Care (Signed)
   Problem: Education: Goal: Ability to describe self-care measures that may prevent or decrease complications (Diabetes Survival Skills Education) will improve Outcome: Progressing Goal: Individualized Educational Video(s) Outcome: Progressing

## 2024-05-24 NOTE — Progress Notes (Signed)
"   ° °  Subjective/Chief Complaint: Feels better today.  Nausea has resolved.  She had 5 bowel movements overnight.   Objective: Vital signs in last 24 hours: Temp:  [98.3 F (36.8 C)-98.7 F (37.1 C)] 98.4 F (36.9 C) (01/31 0536) Pulse Rate:  [73-82] 73 (01/31 0536) Resp:  [17-18] 17 (01/31 0536) BP: (138-142)/(89-98) 139/89 (01/31 0536) SpO2:  [98 %-100 %] 98 % (01/31 0536) Last BM Date : 05/23/24  Intake/Output from previous day: 01/30 0701 - 01/31 0700 In: 1234.1 [P.O.:120; I.V.:863.1; IV Piggyback:251] Out: -  Intake/Output this shift: No intake/output data recorded.  General appearance: alert and cooperative Resp: clear to auscultation bilaterally Cardio: regular rate and rhythm GI: Soft, minimal tenderness.  Good bowel sounds.  Incision looks good.  Lab Results:  Recent Labs    05/24/24 0526  WBC 11.0*  HGB 11.4*  HCT 36.9  PLT 228   BMET Recent Labs    05/22/24 0447 05/24/24 0526  NA 143 145  K 2.8* 3.1*  CL 101 110  CO2 29 27  GLUCOSE 168* 183*  BUN 19 16  CREATININE 0.82 0.76  CALCIUM 9.0 8.5*   PT/INR No results for input(s): LABPROT, INR in the last 72 hours. ABG No results for input(s): PHART, HCO3 in the last 72 hours.  Invalid input(s): PCO2, PO2  Studies/Results: No results found.  Anti-infectives: Anti-infectives (From admission, onward)    None       Assessment/Plan: s/p **Canceled** Procedures: LAPAROSCOPY, DIAGNOSTIC (N/A) Partial SBO seems to be resolving. POD 11 ventral hernia repair with mesh Ileus vs sbo. Slow improvement IV hydration. Replace K Ambulate After discussion with her and her family we will hold off on surgery today and monitor her closely  LOS: 4 days    Deward Null III 05/24/2024  "

## 2024-05-25 LAB — GLUCOSE, CAPILLARY
Glucose-Capillary: 139 mg/dL — ABNORMAL HIGH (ref 70–99)
Glucose-Capillary: 142 mg/dL — ABNORMAL HIGH (ref 70–99)
Glucose-Capillary: 124 mg/dL — ABNORMAL HIGH (ref 70–99)
Glucose-Capillary: 136 mg/dL — ABNORMAL HIGH (ref 70–99)

## 2024-05-25 NOTE — Plan of Care (Signed)
   Problem: Education: Goal: Ability to describe self-care measures that may prevent or decrease complications (Diabetes Survival Skills Education) will improve Outcome: Progressing Goal: Individualized Educational Video(s) Outcome: Progressing   Problem: Coping: Goal: Ability to adjust to condition or change in health will improve Outcome: Progressing

## 2024-05-25 NOTE — Plan of Care (Signed)
" °  Problem: Nutritional: Goal: Maintenance of adequate nutrition will improve Outcome: Progressing   Problem: Activity: Goal: Risk for activity intolerance will decrease Outcome: Progressing   Problem: Pain Managment: Goal: General experience of comfort will improve and/or be controlled Outcome: Progressing   Problem: Safety: Goal: Ability to remain free from injury will improve Outcome: Progressing   Problem: Bowel/Gastric: Goal: Gastrointestinal status for postoperative course will improve Outcome: Progressing   "

## 2024-05-25 NOTE — Progress Notes (Signed)
"   ° °  Subjective/Chief Complaint: No complaints. Feels better. Still passing flatus   Objective: Vital signs in last 24 hours: Temp:  [98 F (36.7 C)-98.2 F (36.8 C)] 98 F (36.7 C) (02/01 0441) Pulse Rate:  [68-77] 68 (02/01 0441) Resp:  [16-17] 17 (02/01 0441) BP: (133-163)/(85-100) 143/86 (02/01 0441) SpO2:  [96 %-100 %] 96 % (02/01 0441) Last BM Date : 05/24/24  Intake/Output from previous day: 01/31 0701 - 02/01 0700 In: 1240 [P.O.:1240] Out: -  Intake/Output this shift: No intake/output data recorded.  General appearance: alert and cooperative Resp: clear to auscultation bilaterally Cardio: regular rate and rhythm GI: soft, nontender  Lab Results:  Recent Labs    05/24/24 0526  WBC 11.0*  HGB 11.4*  HCT 36.9  PLT 228   BMET Recent Labs    05/24/24 0526  NA 145  K 3.1*  CL 110  CO2 27  GLUCOSE 183*  BUN 16  CREATININE 0.76  CALCIUM 8.5*   PT/INR No results for input(s): LABPROT, INR in the last 72 hours. ABG No results for input(s): PHART, HCO3 in the last 72 hours.  Invalid input(s): PCO2, PO2  Studies/Results: No results found.  Anti-infectives: Anti-infectives (From admission, onward)    None       Assessment/Plan: s/p **Canceled** Procedures: LAPAROSCOPY, DIAGNOSTIC (N/A) Advance diet. Allow soft food today Ambulate Partial SBO seems to be resolving. POD 12 ventral hernia repair with mesh Ileus vs sbo. Slow improvement IV hydration. Replace K  LOS: 5 days    Deward Null III 05/25/2024  "

## 2024-05-26 LAB — GLUCOSE, CAPILLARY: Glucose-Capillary: 135 mg/dL — ABNORMAL HIGH (ref 70–99)

## 2024-05-26 NOTE — Plan of Care (Signed)
" °  Problem: Education: Goal: Ability to describe self-care measures that may prevent or decrease complications (Diabetes Survival Skills Education) will improve Outcome: Adequate for Discharge Goal: Individualized Educational Video(s) Outcome: Adequate for Discharge   Problem: Coping: Goal: Ability to adjust to condition or change in health will improve Outcome: Adequate for Discharge   Problem: Fluid Volume: Goal: Ability to maintain a balanced intake and output will improve Outcome: Adequate for Discharge   Problem: Health Behavior/Discharge Planning: Goal: Ability to identify and utilize available resources and services will improve Outcome: Adequate for Discharge Goal: Ability to manage health-related needs will improve Outcome: Adequate for Discharge   Problem: Metabolic: Goal: Ability to maintain appropriate glucose levels will improve Outcome: Adequate for Discharge   Problem: Nutritional: Goal: Maintenance of adequate nutrition will improve Outcome: Adequate for Discharge Goal: Progress toward achieving an optimal weight will improve Outcome: Adequate for Discharge   Problem: Skin Integrity: Goal: Risk for impaired skin integrity will decrease Outcome: Adequate for Discharge   Problem: Tissue Perfusion: Goal: Adequacy of tissue perfusion will improve Outcome: Adequate for Discharge   Problem: Education: Goal: Knowledge of General Education information will improve Description: Including pain rating scale, medication(s)/side effects and non-pharmacologic comfort measures Outcome: Adequate for Discharge   Problem: Health Behavior/Discharge Planning: Goal: Ability to manage health-related needs will improve Outcome: Adequate for Discharge   Problem: Clinical Measurements: Goal: Ability to maintain clinical measurements within normal limits will improve Outcome: Adequate for Discharge Goal: Will remain free from infection Outcome: Adequate for Discharge Goal:  Diagnostic test results will improve Outcome: Adequate for Discharge Goal: Respiratory complications will improve Outcome: Adequate for Discharge Goal: Cardiovascular complication will be avoided Outcome: Adequate for Discharge   Problem: Activity: Goal: Risk for activity intolerance will decrease Outcome: Adequate for Discharge   Problem: Nutrition: Goal: Adequate nutrition will be maintained Outcome: Adequate for Discharge   Problem: Coping: Goal: Level of anxiety will decrease Outcome: Adequate for Discharge   Problem: Elimination: Goal: Will not experience complications related to bowel motility Outcome: Adequate for Discharge Goal: Will not experience complications related to urinary retention Outcome: Adequate for Discharge   Problem: Pain Managment: Goal: General experience of comfort will improve and/or be controlled Outcome: Adequate for Discharge   Problem: Safety: Goal: Ability to remain free from injury will improve Outcome: Adequate for Discharge   Problem: Skin Integrity: Goal: Risk for impaired skin integrity will decrease Outcome: Adequate for Discharge   Problem: Activity: Goal: Ability to tolerate increased activity will improve Outcome: Adequate for Discharge   Problem: Bowel/Gastric: Goal: Gastrointestinal status for postoperative course will improve Outcome: Adequate for Discharge   "

## 2024-05-26 NOTE — Progress Notes (Signed)
"   ° °  Subjective/Chief Complaint: Feels better. Ate good yesterday and had a bm   Objective: Vital signs in last 24 hours: Temp:  [98.3 F (36.8 C)-99.4 F (37.4 C)] 98.3 F (36.8 C) (02/02 0535) Pulse Rate:  [71-85] 85 (02/02 0535) Resp:  [16-17] 17 (02/02 0535) BP: (131-175)/(73-97) 149/88 (02/02 0535) SpO2:  [96 %-98 %] 96 % (02/02 0535) Last BM Date : 05/25/24  Intake/Output from previous day: 02/01 0701 - 02/02 0700 In: 970 [P.O.:970] Out: 0  Intake/Output this shift: Total I/O In: 360 [P.O.:360] Out: -   General appearance: alert and cooperative Resp: clear to auscultation bilaterally Cardio: regular rate and rhythm GI: soft, minimal tenderness  Lab Results:  Recent Labs    05/24/24 0526  WBC 11.0*  HGB 11.4*  HCT 36.9  PLT 228   BMET Recent Labs    05/24/24 0526  NA 145  K 3.1*  CL 110  CO2 27  GLUCOSE 183*  BUN 16  CREATININE 0.76  CALCIUM 8.5*   PT/INR No results for input(s): LABPROT, INR in the last 72 hours. ABG No results for input(s): PHART, HCO3 in the last 72 hours.  Invalid input(s): PCO2, PO2  Studies/Results: No results found.  Anti-infectives: Anti-infectives (From admission, onward)    None       Assessment/Plan: s/p **Canceled** Procedures: LAPAROSCOPY, DIAGNOSTIC (N/A) Advance diet Discharge  LOS: 6 days    Amanda Eaton 05/26/2024  "

## 2024-09-16 ENCOUNTER — Ambulatory Visit: Admitting: Adult Health
